# Patient Record
Sex: Male | Born: 1940 | Race: White | Hispanic: No | Marital: Married | Smoking: Never smoker
Health system: Southern US, Community
[De-identification: ages and names within clinical notes are randomized; demographics above are authoritative.]

## PROBLEM LIST (undated history)

## (undated) DIAGNOSIS — E785 Hyperlipidemia, unspecified: Secondary | ICD-10-CM

## (undated) DIAGNOSIS — K649 Unspecified hemorrhoids: Secondary | ICD-10-CM

## (undated) DIAGNOSIS — F329 Major depressive disorder, single episode, unspecified: Secondary | ICD-10-CM

## (undated) DIAGNOSIS — F32A Depression, unspecified: Secondary | ICD-10-CM

---
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
## 2020-01-20 ENCOUNTER — Encounter (HOSPITAL_COMMUNITY): Payer: Self-pay | Admitting: Emergency Medicine

## 2020-01-20 ENCOUNTER — Emergency Department (HOSPITAL_COMMUNITY): Payer: Medicare Other

## 2020-01-20 ENCOUNTER — Inpatient Hospital Stay (HOSPITAL_COMMUNITY)
Admission: EM | Admit: 2020-01-20 | Discharge: 2020-01-24 | DRG: 521 | Disposition: A | Discharge status: Rehabilitation Facility | Payer: Medicare Other | Attending: Internal Medicine | Admitting: Internal Medicine

## 2020-01-20 ENCOUNTER — Other Ambulatory Visit: Payer: Self-pay

## 2020-01-20 DIAGNOSIS — W050XXD Fall from non-moving wheelchair, subsequent encounter: Secondary | ICD-10-CM | POA: Diagnosis present

## 2020-01-20 DIAGNOSIS — M7989 Other specified soft tissue disorders: Secondary | ICD-10-CM | POA: Diagnosis not present

## 2020-01-20 DIAGNOSIS — Z96649 Presence of unspecified artificial hip joint: Secondary | ICD-10-CM

## 2020-01-20 DIAGNOSIS — F0390 Unspecified dementia without behavioral disturbance: Secondary | ICD-10-CM | POA: Diagnosis present

## 2020-01-20 DIAGNOSIS — Z79899 Other long term (current) drug therapy: Secondary | ICD-10-CM | POA: Diagnosis not present

## 2020-01-20 DIAGNOSIS — Z993 Dependence on wheelchair: Secondary | ICD-10-CM | POA: Diagnosis not present

## 2020-01-20 DIAGNOSIS — Z419 Encounter for procedure for purposes other than remedying health state, unspecified: Secondary | ICD-10-CM

## 2020-01-20 DIAGNOSIS — K5900 Constipation, unspecified: Secondary | ICD-10-CM | POA: Diagnosis present

## 2020-01-20 DIAGNOSIS — S72001A Fracture of unspecified part of neck of right femur, initial encounter for closed fracture: Principal | ICD-10-CM | POA: Diagnosis present

## 2020-01-20 DIAGNOSIS — S72001S Fracture of unspecified part of neck of right femur, sequela: Secondary | ICD-10-CM | POA: Diagnosis not present

## 2020-01-20 DIAGNOSIS — D62 Acute posthemorrhagic anemia: Secondary | ICD-10-CM | POA: Diagnosis present

## 2020-01-20 DIAGNOSIS — Z8659 Personal history of other mental and behavioral disorders: Secondary | ICD-10-CM | POA: Diagnosis not present

## 2020-01-20 DIAGNOSIS — E559 Vitamin D deficiency, unspecified: Secondary | ICD-10-CM | POA: Diagnosis present

## 2020-01-20 DIAGNOSIS — G9341 Metabolic encephalopathy: Secondary | ICD-10-CM | POA: Diagnosis not present

## 2020-01-20 DIAGNOSIS — Z7982 Long term (current) use of aspirin: Secondary | ICD-10-CM | POA: Diagnosis not present

## 2020-01-20 DIAGNOSIS — Y92009 Unspecified place in unspecified non-institutional (private) residence as the place of occurrence of the external cause: Secondary | ICD-10-CM | POA: Diagnosis not present

## 2020-01-20 DIAGNOSIS — N400 Enlarged prostate without lower urinary tract symptoms: Secondary | ICD-10-CM | POA: Diagnosis present

## 2020-01-20 DIAGNOSIS — F329 Major depressive disorder, single episode, unspecified: Secondary | ICD-10-CM | POA: Diagnosis present

## 2020-01-20 DIAGNOSIS — I69351 Hemiplegia and hemiparesis following cerebral infarction affecting right dominant side: Secondary | ICD-10-CM

## 2020-01-20 DIAGNOSIS — Z20822 Contact with and (suspected) exposure to covid-19: Secondary | ICD-10-CM | POA: Diagnosis present

## 2020-01-20 DIAGNOSIS — I69311 Memory deficit following cerebral infarction: Secondary | ICD-10-CM | POA: Diagnosis not present

## 2020-01-20 DIAGNOSIS — E785 Hyperlipidemia, unspecified: Secondary | ICD-10-CM | POA: Diagnosis present

## 2020-01-20 DIAGNOSIS — W19XXXA Unspecified fall, initial encounter: Secondary | ICD-10-CM

## 2020-01-20 DIAGNOSIS — G8111 Spastic hemiplegia affecting right dominant side: Secondary | ICD-10-CM | POA: Diagnosis not present

## 2020-01-20 DIAGNOSIS — R413 Other amnesia: Secondary | ICD-10-CM | POA: Diagnosis not present

## 2020-01-20 DIAGNOSIS — R252 Cramp and spasm: Secondary | ICD-10-CM | POA: Diagnosis present

## 2020-01-20 DIAGNOSIS — W050XXA Fall from non-moving wheelchair, initial encounter: Secondary | ICD-10-CM | POA: Diagnosis present

## 2020-01-20 DIAGNOSIS — R400 Somnolence: Secondary | ICD-10-CM | POA: Diagnosis not present

## 2020-01-20 DIAGNOSIS — S72091D Other fracture of head and neck of right femur, subsequent encounter for closed fracture with routine healing: Secondary | ICD-10-CM | POA: Diagnosis present

## 2020-01-20 DIAGNOSIS — I69322 Dysarthria following cerebral infarction: Secondary | ICD-10-CM | POA: Diagnosis not present

## 2020-01-20 DIAGNOSIS — R410 Disorientation, unspecified: Secondary | ICD-10-CM | POA: Diagnosis not present

## 2020-01-20 DIAGNOSIS — Z79891 Long term (current) use of opiate analgesic: Secondary | ICD-10-CM | POA: Diagnosis not present

## 2020-01-20 DIAGNOSIS — Z96641 Presence of right artificial hip joint: Secondary | ICD-10-CM | POA: Diagnosis present

## 2020-01-20 DIAGNOSIS — H04129 Dry eye syndrome of unspecified lacrimal gland: Secondary | ICD-10-CM | POA: Diagnosis not present

## 2020-01-20 DIAGNOSIS — G4700 Insomnia, unspecified: Secondary | ICD-10-CM | POA: Diagnosis not present

## 2020-01-20 DIAGNOSIS — T148XXA Other injury of unspecified body region, initial encounter: Secondary | ICD-10-CM

## 2020-01-20 DIAGNOSIS — S72001D Fracture of unspecified part of neck of right femur, subsequent encounter for closed fracture with routine healing: Secondary | ICD-10-CM | POA: Diagnosis not present

## 2020-01-20 DIAGNOSIS — I69398 Other sequelae of cerebral infarction: Secondary | ICD-10-CM | POA: Diagnosis not present

## 2020-01-20 HISTORY — DX: Depression, unspecified: F32.A

## 2020-01-20 HISTORY — DX: Unspecified hemorrhoids: K64.9

## 2020-01-20 HISTORY — DX: Hyperlipidemia, unspecified: E78.5

## 2020-01-20 LAB — CBC WITH DIFFERENTIAL/PLATELET
Abs Immature Granulocytes: 0.04 10*3/uL (ref 0.00–0.07)
Basophils Absolute: 0 10*3/uL (ref 0.0–0.1)
Basophils Relative: 0 %
Eosinophils Absolute: 0 10*3/uL (ref 0.0–0.5)
Eosinophils Relative: 0 %
HCT: 42.1 % (ref 39.0–52.0)
Hemoglobin: 13.7 g/dL (ref 13.0–17.0)
Immature Granulocytes: 0 %
Lymphocytes Relative: 6 %
Lymphs Abs: 0.6 10*3/uL — ABNORMAL LOW (ref 0.7–4.0)
MCH: 29.4 pg (ref 26.0–34.0)
MCHC: 32.5 g/dL (ref 30.0–36.0)
MCV: 90.3 fL (ref 80.0–100.0)
Monocytes Absolute: 0.6 10*3/uL (ref 0.1–1.0)
Monocytes Relative: 6 %
Neutro Abs: 9.3 10*3/uL — ABNORMAL HIGH (ref 1.7–7.7)
Neutrophils Relative %: 88 %
Platelets: 203 10*3/uL (ref 150–400)
RBC: 4.66 MIL/uL (ref 4.22–5.81)
RDW: 13 % (ref 11.5–15.5)
WBC: 10.6 10*3/uL — ABNORMAL HIGH (ref 4.0–10.5)
nRBC: 0 % (ref 0.0–0.2)

## 2020-01-20 LAB — BASIC METABOLIC PANEL
Anion gap: 12 (ref 5–15)
BUN: 24 mg/dL — ABNORMAL HIGH (ref 8–23)
CO2: 23 mmol/L (ref 22–32)
Calcium: 9.3 mg/dL (ref 8.9–10.3)
Chloride: 104 mmol/L (ref 98–111)
Creatinine, Ser: 0.96 mg/dL (ref 0.61–1.24)
GFR calc Af Amer: >60 mL/min (ref >60)
GFR calc non Af Amer: >60 mL/min (ref >60)
Glucose, Bld: 113 mg/dL — ABNORMAL HIGH (ref 70–99)
Potassium: 4.4 mmol/L (ref 3.5–5.1)
Sodium: 139 mmol/L (ref 135–145)

## 2020-01-20 MED ORDER — SENNA 8.6 MG PO TABS
1.0000 | ORAL_TABLET | Freq: Two times a day (BID) | ORAL | Status: DC
  Administered 2020-01-21 – 2020-01-24 (×5): 8.6 mg via ORAL
  Filled 2020-01-20 (×6): qty 1

## 2020-01-20 MED ORDER — METHOCARBAMOL 1000 MG/10ML IJ SOLN
500.0000 mg | Freq: Four times a day (QID) | INTRAVENOUS | Status: DC | PRN
  Filled 2020-01-20 (×3): qty 5

## 2020-01-20 MED ORDER — MORPHINE SULFATE (PF) 4 MG/ML IV SOLN
4.0000 mg | Freq: Once | INTRAVENOUS | Status: AC
  Administered 2020-01-20: 22:00:00 4 mg via INTRAVENOUS
  Filled 2020-01-20: qty 1

## 2020-01-20 MED ORDER — HYDROCODONE-ACETAMINOPHEN 5-325 MG PO TABS
1.0000 | ORAL_TABLET | Freq: Four times a day (QID) | ORAL | Status: DC | PRN
  Administered 2020-01-20: 1 via ORAL
  Administered 2020-01-21: 2 via ORAL
  Filled 2020-01-20: qty 1
  Filled 2020-01-20: qty 2

## 2020-01-20 MED ORDER — MORPHINE SULFATE (PF) 2 MG/ML IV SOLN
0.5000 mg | INTRAVENOUS | Status: DC | PRN
  Administered 2020-01-20 – 2020-01-21 (×2): 0.5 mg via INTRAVENOUS
  Filled 2020-01-20 (×2): qty 1

## 2020-01-20 MED ORDER — METHOCARBAMOL 500 MG PO TABS
500.0000 mg | ORAL_TABLET | Freq: Four times a day (QID) | ORAL | Status: DC | PRN
  Administered 2020-01-20 – 2020-01-22 (×3): 500 mg via ORAL
  Filled 2020-01-20 (×3): qty 1

## 2020-01-20 NOTE — ED Notes (Signed)
Pt returned from x-ray.  Pt's daughter updated on pt's condition at this time.  Pt remains alert and oriented x's 3

## 2020-01-20 NOTE — ED Triage Notes (Signed)
Pt to ED via [facility] after reported sliding out of his chair while exercising.  Pt st's he feels like he pulled something in his right thigh.  Pt also c/o soreness to right buttock

## 2020-01-20 NOTE — H&P (Signed)
History and Physical    Joshua Shannon WCB:[id number] DOB: 04/05/1941 DOA: 01/20/2020  PCP: Kaleen Mask, MD Patient coming from: ALF  Chief Complaint: Fall, right hip pain  HPI: Joshua Shannon is a 79 y.o. male with medical history significant of hyperlipidemia, depression, hemorrhoids presenting to the ED for evaluation of right hip pain after a mechanical fall.  Patient states he fell out of his wheelchair and since then is having pain in his right hip region.  Denies head injury or loss of consciousness.  Denies lightheadedness/dizziness, chest pain, palpitations, or shortness of breath.  No other complaints.   ED Course: Afebrile and hemodynamically stable.  X-ray showing acute right femoral neck fracture.  Labs showing no significant leukocytosis.  Chest x-ray not suggestive of pneumonia.  ED provider discussed the case with Dr. Everardo Pacific, Ortho will consult in a.m. Patient received morphine.  Review of Systems:  All systems reviewed and apart from history of presenting illness, are negative.  Past Medical History:  Diagnosis Date  . Depression   . Hemorrhoids   . Hyperlipemia     History reviewed. No pertinent surgical history.   reports that he has never smoked. He has never used smokeless tobacco. He reports that he does not use drugs. No history on file for alcohol.  Not on File  History reviewed. No pertinent family history.  Prior to Admission medications   Not on File    Physical Exam: Vitals:   01/20/20 1822 01/20/20 2036  BP: (!) 158/79   Pulse: 91   Resp: 16   Temp: 98.2 F (36.8 C)   TempSrc: Oral   SpO2: 94%   Weight:  58.5 kg  Height:  5\' 7"  (1.702 m)    Physical Exam  Constitutional: He is oriented to person, place, and time. He appears well-developed and well-nourished. No distress.  HENT:  Head: Normocephalic.  Eyes: Right eye exhibits no discharge. Left eye exhibits no discharge.  Cardiovascular: Normal rate, regular rhythm and intact  distal pulses.  Pulmonary/Chest: Effort normal and breath sounds normal. No respiratory distress. He has no wheezes. He has no rales.  Abdominal: Soft. Bowel sounds are normal. He exhibits no distension. There is no abdominal tenderness. There is no guarding.  Musculoskeletal:        General: No edema.     Cervical back: Neck supple.     Comments: Right lower extremity shortened and externally rotated  Neurological: He is alert and oriented to person, place, and time.  Skin: Skin is warm and dry. He is not diaphoretic.     Labs on Admission: I have personally reviewed following labs and imaging studies  CBC: Recent Labs  Lab 01/20/20 2136  WBC 10.6*  NEUTROABS 9.3*  HGB 13.7  HCT 42.1  MCV 90.3  PLT 203   Basic Metabolic Panel: Recent Labs  Lab 01/20/20 2136  NA 139  K 4.4  CL 104  CO2 23  GLUCOSE 113*  BUN 24*  CREATININE 0.96  CALCIUM 9.3   GFR: Estimated Creatinine Clearance: 54.2 mL/min (by C-G formula based on SCr of 0.96 mg/dL). Liver Function Tests: No results for input(s): AST, ALT, ALKPHOS, BILITOT, PROT, ALBUMIN in the last 168 hours. No results for input(s): LIPASE, AMYLASE in the last 168 hours. No results for input(s): AMMONIA in the last 168 hours. Coagulation Profile: No results for input(s): INR, PROTIME in the last 168 hours. Cardiac Enzymes: No results for input(s): CKTOTAL, CKMB, CKMBINDEX, TROPONINI in the last  168 hours. BNP (last 3 results) No results for input(s): PROBNP in the last 8760 hours. HbA1C: No results for input(s): HGBA1C in the last 72 hours. CBG: No results for input(s): GLUCAP in the last 168 hours. Lipid Profile: No results for input(s): CHOL, HDL, LDLCALC, TRIG, CHOLHDL, LDLDIRECT in the last 72 hours. Thyroid Function Tests: No results for input(s): TSH, T4TOTAL, FREET4, T3FREE, THYROIDAB in the last 72 hours. Anemia Panel: No results for input(s): VITAMINB12, FOLATE, FERRITIN, TIBC, IRON, RETICCTPCT in the last 72  hours. Urine analysis: No results found for: COLORURINE, APPEARANCEUR, LABSPEC, PHURINE, GLUCOSEU, HGBUR, BILIRUBINUR, KETONESUR, PROTEINUR, UROBILINOGEN, NITRITE, LEUKOCYTESUR  Radiological Exams on Admission: DG Chest 1 View  Result Date: 01/20/2020 CLINICAL DATA:  Right hip fracture EXAM: CHEST  1 VIEW COMPARISON:  None. FINDINGS: The heart size and mediastinal contours are within normal limits. Aortic atherosclerosis. Both lungs are clear. The visualized skeletal structures are unremarkable. IMPRESSION: No active disease. Electronically Signed   By: Donavan Foil M.D.   On: 01/20/2020 20:26   DG Pelvis 1-2 Views  Result Date: 01/20/2020 CLINICAL DATA:  [place] onto right hip EXAM: PELVIS - 1-2 VIEW COMPARISON:  None. FINDINGS: Metallic fragment or hardware over the upper sacrum. Pubic symphysis and rami appear intact. No fracture or malalignment left hip. Acute mildly displaced right femoral neck fracture. IMPRESSION: Acute right femoral neck fracture Electronically Signed   By: Donavan Foil M.D.   On: 01/20/2020 20:25   DG Femur Min 2 Views Right  Result Date: 01/20/2020 CLINICAL DATA:  Fall onto right hip EXAM: RIGHT FEMUR 2 VIEWS COMPARISON:  None. FINDINGS: Acute right femoral neck fracture. No femoral head dislocation. Mid to distal femur show no fracture or malalignment. Vascular calcifications. IMPRESSION: Acute right femoral neck fracture Electronically Signed   By: Donavan Foil M.D.   On: 01/20/2020 20:26    EKG: Ordered and pending at this time.  Assessment/Plan Principal Problem:   Hip fracture (HCC) Active Problems:   Fall at home, initial encounter   Right femoral neck fracture secondary to a mechanical fall -Ortho will consult in a.m. -Keep n.p.o. after midnight -Morphine as needed for pain, Norco as needed -Robaxin as needed for muscle spasms -Nonweightbearing -Bowel regimen  Pharmacy med rec pending.  DVT prophylaxis: SCDs at this time, pending orthopedic  evaluation Code Status: Patient wishes to be DNR. Family Communication: No family available at this time. Disposition Plan: Anticipate discharge after clinical improvement. Consults called: Orthopedics Admission status: It is my clinical opinion that admission to INPATIENT is reasonable and necessary in this 79 y.o. male . presenting with right femoral neck fracture secondary to mechanical fall.  Will need surgery, Ortho will consult in a.m.  Given the aforementioned, the predictability of an adverse outcome is felt to be significant. I expect that the patient will require at least 2 midnights in the hospital to treat this condition.  Shela Leff MD [facility]  If 7PM-7AM, please contact night-coverage [email or web address] Password [facility]  01/20/2020, 10:41 PM

## 2020-01-20 NOTE — ED Provider Notes (Signed)
[facility] EMERGENCY DEPARTMENT Provider Note   CSN: [id number] Arrival date & time: 01/20/20  1809     History Chief Complaint  Patient presents with  . Fall    Joshua Shannon is a 79 y.o. male.  79 year old male with past medical history below who presents with fall.  Patient reports just prior to arrival, he had a mechanical fall out of his wheelchair and he landed on his right side/hip.  Since the fall, he has had moderate, constant pain in his right hip and thigh that radiates down to his knee.  He denies any head injury, loss of consciousness, or any other areas of pain.  He takes aspirin but no other anticoagulants.  The history is provided by the patient.  Fall       Past Medical History:  Diagnosis Date  . Depression   . Hemorrhoids   . Hyperlipemia     Patient Active Problem List   Diagnosis Date Noted  . Hip fracture ([place]) 01/20/2020    History reviewed. No pertinent surgical history.     History reviewed. No pertinent family history.  Social History   Tobacco Use  . Smoking status: Never Smoker  . Smokeless tobacco: Never Used  Substance Use Topics  . Alcohol use: Not on file  . Drug use: Never    Home Medications Prior to Admission medications   Not on File    Allergies    Patient has no allergy information on record.  Review of Systems   Review of Systems All other systems reviewed and are negative except that which was mentioned in HPI  Physical Exam Updated Vital Signs BP (!) 158/79 (BP Location: Left Arm)   Pulse 91   Temp 98.2 F (36.8 C) (Oral)   Resp 16   SpO2 94%   Physical Exam Vitals and nursing note reviewed.  Constitutional:      General: He is not in acute distress.    Appearance: He is well-developed.  HENT:     Head: Normocephalic and atraumatic.  Eyes:     Conjunctiva/sclera: Conjunctivae normal.  Cardiovascular:     Rate and Rhythm: Normal rate and regular rhythm.     Pulses: Normal  pulses.     Heart sounds: Normal heart sounds.  Pulmonary:     Effort: Pulmonary effort is normal.     Breath sounds: Normal breath sounds.  Chest:     Chest wall: No tenderness.  Abdominal:     General: Bowel sounds are normal. There is no distension.     Palpations: Abdomen is soft.     Tenderness: There is no abdominal tenderness.  Musculoskeletal:        General: Tenderness present.     Cervical back: Normal range of motion and neck supple. No tenderness.     Comments: Tenderness to palpation of right hip with leg slightly externally rotated, partially flexed at knee, no obvious femur deformity; no other focal joint tenderness  Skin:    General: Skin is warm and dry.  Neurological:     Mental Status: He is alert and oriented to person, place, and time.     Sensory: No sensory deficit.     Comments: Fluent speech  Psychiatric:        Judgment: Judgment normal.     ED Results / Procedures / Treatments   Labs (all labs ordered are listed, but only abnormal results are displayed) Labs Reviewed  BASIC METABOLIC PANEL -  Abnormal; Notable for the following components:      Result Value   Glucose, Bld 113 (*)    BUN 24 (*)    All other components within normal limits  CBC WITH DIFFERENTIAL/PLATELET - Abnormal; Notable for the following components:   WBC 10.6 (*)    Neutro Abs 9.3 (*)    Lymphs Abs 0.6 (*)    All other components within normal limits  SARS CORONAVIRUS 2 (TAT 6-24 HRS)    EKG None  Radiology DG Chest 1 View  Result Date: 01/20/2020 CLINICAL DATA:  Right hip fracture EXAM: CHEST  1 VIEW COMPARISON:  None. FINDINGS: The heart size and mediastinal contours are within normal limits. Aortic atherosclerosis. Both lungs are clear. The visualized skeletal structures are unremarkable. IMPRESSION: No active disease. Electronically Signed   By: Jasmine Pang M.D.   On: 01/20/2020 20:26   DG Pelvis 1-2 Views  Result Date: 01/20/2020 CLINICAL DATA:  [place] onto right  hip EXAM: PELVIS - 1-2 VIEW COMPARISON:  None. FINDINGS: Metallic fragment or hardware over the upper sacrum. Pubic symphysis and rami appear intact. No fracture or malalignment left hip. Acute mildly displaced right femoral neck fracture. IMPRESSION: Acute right femoral neck fracture Electronically Signed   By: Jasmine Pang M.D.   On: 01/20/2020 20:25   DG Femur Min 2 Views Right  Result Date: 01/20/2020 CLINICAL DATA:  Fall onto right hip EXAM: RIGHT FEMUR 2 VIEWS COMPARISON:  None. FINDINGS: Acute right femoral neck fracture. No femoral head dislocation. Mid to distal femur show no fracture or malalignment. Vascular calcifications. IMPRESSION: Acute right femoral neck fracture Electronically Signed   By: Jasmine Pang M.D.   On: 01/20/2020 20:26    Procedures Procedures (including critical care time)  Medications Ordered in ED Medications - No data to display  ED Course  I have reviewed the triage vital signs and the nursing notes.  Pertinent labs & imaging results that were available during my care of the patient were reviewed by me and considered in my medical decision making (see chart for details).    MDM Rules/Calculators/A&P                      Neurovascularly intact distally.  Plain films show femoral neck fracture on the right.  Discussed with patient and then contacted his wife over the phone.  She states that he is wheelchair-bound at baseline and only ambulates during physical therapy, otherwise does not ambulate.  Contacted orthopedics, discussed w/ Dr. Everardo Pacific. They will see pt in consultation tomorrow to discuss options.  Ultimately, I feel that the patient would benefit from pain control in the hospital even if he is deemed not a surgical candidate.  Discussed admission with [facility], Dr. Loney Loh.  Final Clinical Impression(s) / ED Diagnoses Final diagnoses:  None    Rx / DC Orders ED Discharge Orders    None       Pristine Gladhill, Ambrose Finland, MD 01/20/20 2238

## 2020-01-20 NOTE — ED Notes (Signed)
Joshua Shannon daughter [phone] looking for an update on the patient

## 2020-01-21 ENCOUNTER — Inpatient Hospital Stay (HOSPITAL_COMMUNITY): Payer: Medicare Other

## 2020-01-21 ENCOUNTER — Inpatient Hospital Stay (HOSPITAL_COMMUNITY): Payer: Medicare Other | Admitting: Certified Registered Nurse Anesthetist

## 2020-01-21 ENCOUNTER — Encounter (HOSPITAL_COMMUNITY)
Admission: EM | Disposition: A | Discharge status: Rehabilitation Facility | Payer: Self-pay | Source: Home / Self Care | Attending: Family Medicine

## 2020-01-21 ENCOUNTER — Other Ambulatory Visit: Payer: Self-pay

## 2020-01-21 ENCOUNTER — Encounter (HOSPITAL_COMMUNITY): Payer: Self-pay | Admitting: Internal Medicine

## 2020-01-21 DIAGNOSIS — S72001A Fracture of unspecified part of neck of right femur, initial encounter for closed fracture: Principal | ICD-10-CM

## 2020-01-21 HISTORY — PX: ANTERIOR APPROACH HEMI HIP ARTHROPLASTY: SHX6690

## 2020-01-21 LAB — CBC
HCT: 39.7 % (ref 39.0–52.0)
Hemoglobin: 13 g/dL (ref 13.0–17.0)
MCH: 29.4 pg (ref 26.0–34.0)
MCHC: 32.7 g/dL (ref 30.0–36.0)
MCV: 89.8 fL (ref 80.0–100.0)
Platelets: 181 10*3/uL (ref 150–400)
RBC: 4.42 MIL/uL (ref 4.22–5.81)
RDW: 12.6 % (ref 11.5–15.5)
WBC: 9.3 10*3/uL (ref 4.0–10.5)
nRBC: 0 % (ref 0.0–0.2)

## 2020-01-21 LAB — CREATININE, SERUM
Creatinine, Ser: 0.97 mg/dL (ref 0.61–1.24)
GFR calc Af Amer: >60 mL/min (ref >60)
GFR calc non Af Amer: >60 mL/min (ref >60)

## 2020-01-21 LAB — SURGICAL PCR SCREEN
MRSA, PCR: NEGATIVE
Staphylococcus aureus: NEGATIVE

## 2020-01-21 LAB — SARS CORONAVIRUS 2 (TAT 6-24 HRS): SARS Coronavirus 2: NEGATIVE

## 2020-01-21 SURGERY — HEMIARTHROPLASTY, HIP, DIRECT ANTERIOR APPROACH, FOR FRACTURE
Anesthesia: Choice | Laterality: Right

## 2020-01-21 SURGERY — HEMIARTHROPLASTY, HIP, DIRECT ANTERIOR APPROACH, FOR FRACTURE
Anesthesia: Spinal | Site: Hip | Laterality: Right

## 2020-01-21 MED ORDER — SUCCINYLCHOLINE CHLORIDE 200 MG/10ML IV SOSY
PREFILLED_SYRINGE | INTRAVENOUS | Status: AC
  Filled 2020-01-21: qty 10

## 2020-01-21 MED ORDER — STERILE WATER FOR IRRIGATION IR SOLN
Status: DC | PRN
  Administered 2020-01-21: 1000 mL

## 2020-01-21 MED ORDER — TRANEXAMIC ACID 1000 MG/10ML IV SOLN
2000.0000 mg | INTRAVENOUS | Status: DC
  Filled 2020-01-21: qty 20

## 2020-01-21 MED ORDER — BUPIVACAINE LIPOSOME 1.3 % IJ SUSP
20.0000 mL | INTRAMUSCULAR | Status: AC
  Administered 2020-01-21: 20 mL
  Filled 2020-01-21: qty 20

## 2020-01-21 MED ORDER — VANCOMYCIN HCL 1000 MG IV SOLR
INTRAVENOUS | Status: AC
  Filled 2020-01-21: qty 1000

## 2020-01-21 MED ORDER — HYDROCODONE-ACETAMINOPHEN 7.5-325 MG PO TABS
1.0000 | ORAL_TABLET | ORAL | Status: DC | PRN
  Administered 2020-01-21: 22:00:00 1 via ORAL
  Administered 2020-01-22: 2 via ORAL
  Filled 2020-01-21: qty 1
  Filled 2020-01-21: qty 2

## 2020-01-21 MED ORDER — CHLORHEXIDINE GLUCONATE CLOTH 2 % EX PADS
6.0000 | MEDICATED_PAD | Freq: Every day | CUTANEOUS | Status: DC
  Administered 2020-01-21: 6 via TOPICAL

## 2020-01-21 MED ORDER — METHOCARBAMOL 1000 MG/10ML IJ SOLN: 500.0000 mg | Freq: Four times a day (QID) | INTRAVENOUS | Status: DC | PRN

## 2020-01-21 MED ORDER — SODIUM CHLORIDE 0.9 % IV SOLN: INTRAVENOUS | Status: DC

## 2020-01-21 MED ORDER — LACTATED RINGERS IV SOLN: INTRAVENOUS | Status: DC

## 2020-01-21 MED ORDER — BUPIVACAINE IN DEXTROSE 0.75-8.25 % IT SOLN
INTRATHECAL | Status: DC | PRN
  Administered 2020-01-21: 1.8 mL via INTRATHECAL

## 2020-01-21 MED ORDER — FENTANYL CITRATE (PF) 250 MCG/5ML IJ SOLN
INTRAMUSCULAR | Status: AC
  Filled 2020-01-21: qty 5

## 2020-01-21 MED ORDER — ACETAMINOPHEN 500 MG PO TABS
500.0000 mg | ORAL_TABLET | Freq: Four times a day (QID) | ORAL | Status: AC
  Administered 2020-01-21 – 2020-01-22 (×4): 500 mg via ORAL
  Filled 2020-01-21 (×5): qty 1

## 2020-01-21 MED ORDER — BUPIVACAINE HCL (PF) 0.25 % IJ SOLN
INTRAMUSCULAR | Status: AC
  Filled 2020-01-21: qty 30

## 2020-01-21 MED ORDER — SODIUM CHLORIDE (PF) 0.9 % IJ SOLN
INTRAMUSCULAR | Status: DC | PRN
  Administered 2020-01-21 (×2): 10 mL

## 2020-01-21 MED ORDER — BUPIVACAINE HCL (PF) 0.25 % IJ SOLN
INTRAMUSCULAR | Status: DC | PRN
  Administered 2020-01-21: 20 mL

## 2020-01-21 MED ORDER — PROPOFOL 10 MG/ML IV BOLUS
INTRAVENOUS | Status: AC
  Filled 2020-01-21: qty 20

## 2020-01-21 MED ORDER — PROPOFOL 10 MG/ML IV BOLUS
INTRAVENOUS | Status: DC | PRN
  Administered 2020-01-21: 10 mg via INTRAVENOUS
  Administered 2020-01-21 (×2): 20 mg via INTRAVENOUS
  Administered 2020-01-21: 30 mg via INTRAVENOUS

## 2020-01-21 MED ORDER — POLYETHYLENE GLYCOL 3350 17 G PO PACK
17.0000 g | PACK | Freq: Every day | ORAL | Status: DC | PRN
  Administered 2020-01-22: 17 g via ORAL
  Filled 2020-01-21: qty 1

## 2020-01-21 MED ORDER — ONDANSETRON HCL 4 MG/2ML IJ SOLN: 4.0000 mg | Freq: Once | INTRAMUSCULAR | Status: DC | PRN

## 2020-01-21 MED ORDER — MUPIROCIN 2 % EX OINT
1.0000 "application " | TOPICAL_OINTMENT | Freq: Two times a day (BID) | CUTANEOUS | Status: DC
  Administered 2020-01-21 – 2020-01-24 (×6): 1 via NASAL
  Filled 2020-01-21: qty 22

## 2020-01-21 MED ORDER — HYDROCODONE-ACETAMINOPHEN 5-325 MG PO TABS
1.0000 | ORAL_TABLET | ORAL | Status: DC | PRN
  Administered 2020-01-24: 1 via ORAL
  Filled 2020-01-21: qty 1

## 2020-01-21 MED ORDER — ACETAMINOPHEN 325 MG PO TABS
325.0000 mg | ORAL_TABLET | Freq: Four times a day (QID) | ORAL | Status: DC | PRN
  Administered 2020-01-22 – 2020-01-23 (×3): 650 mg via ORAL
  Filled 2020-01-21 (×4): qty 2

## 2020-01-21 MED ORDER — ACETAMINOPHEN 10 MG/ML IV SOLN: 1000.0000 mg | Freq: Once | INTRAVENOUS | Status: DC | PRN

## 2020-01-21 MED ORDER — TRANEXAMIC ACID-NACL 1000-0.7 MG/100ML-% IV SOLN
1000.0000 mg | INTRAVENOUS | Status: AC
  Administered 2020-01-21: 1000 mg via INTRAVENOUS
  Filled 2020-01-21: qty 100

## 2020-01-21 MED ORDER — 0.9 % SODIUM CHLORIDE (POUR BTL) OPTIME
TOPICAL | Status: DC | PRN
  Administered 2020-01-21: 1000 mL

## 2020-01-21 MED ORDER — CEFAZOLIN SODIUM-DEXTROSE 2-4 GM/100ML-% IV SOLN
2.0000 g | INTRAVENOUS | Status: AC
  Administered 2020-01-21: 2 g via INTRAVENOUS
  Filled 2020-01-21: qty 100

## 2020-01-21 MED ORDER — MORPHINE SULFATE (PF) 2 MG/ML IV SOLN
1.0000 mg | INTRAVENOUS | Status: DC | PRN
  Administered 2020-01-22: 02:00:00 1 mg via INTRAVENOUS
  Filled 2020-01-21 (×2): qty 1

## 2020-01-21 MED ORDER — CEFAZOLIN SODIUM-DEXTROSE 2-4 GM/100ML-% IV SOLN
2.0000 g | Freq: Four times a day (QID) | INTRAVENOUS | Status: AC
  Administered 2020-01-21 – 2020-01-22 (×3): 2 g via INTRAVENOUS
  Filled 2020-01-21 (×3): qty 100

## 2020-01-21 MED ORDER — VANCOMYCIN HCL 1 G IV SOLR
INTRAVENOUS | Status: DC | PRN
  Administered 2020-01-21: 1000 mg

## 2020-01-21 MED ORDER — PROPOFOL 500 MG/50ML IV EMUL
INTRAVENOUS | Status: DC | PRN
  Administered 2020-01-21: 50 ug/kg/min via INTRAVENOUS

## 2020-01-21 MED ORDER — ONDANSETRON HCL 4 MG/2ML IJ SOLN
4.0000 mg | Freq: Four times a day (QID) | INTRAMUSCULAR | Status: DC | PRN
  Administered 2020-01-23 (×2): 4 mg via INTRAVENOUS
  Filled 2020-01-21 (×2): qty 2

## 2020-01-21 MED ORDER — TRANEXAMIC ACID-NACL 1000-0.7 MG/100ML-% IV SOLN
INTRAVENOUS | Status: AC
  Filled 2020-01-21: qty 100

## 2020-01-21 MED ORDER — ALUM & MAG HYDROXIDE-SIMETH 200-200-20 MG/5ML PO SUSP: 30.0000 mL | ORAL | Status: DC | PRN

## 2020-01-21 MED ORDER — POVIDONE-IODINE 10 % EX SWAB: 2.0000 "application " | Freq: Once | CUTANEOUS | Status: DC

## 2020-01-21 MED ORDER — FENTANYL CITRATE (PF) 100 MCG/2ML IJ SOLN: 25.0000 ug | INTRAMUSCULAR | Status: DC | PRN

## 2020-01-21 MED ORDER — PHENOL 1.4 % MT LIQD: 1.0000 | OROMUCOSAL | Status: DC | PRN

## 2020-01-21 MED ORDER — ONDANSETRON HCL 4 MG PO TABS: 4.0000 mg | ORAL_TABLET | Freq: Four times a day (QID) | ORAL | Status: DC | PRN

## 2020-01-21 MED ORDER — MAGNESIUM CITRATE PO SOLN: 1.0000 | Freq: Once | ORAL | Status: DC | PRN

## 2020-01-21 MED ORDER — ENOXAPARIN SODIUM 40 MG/0.4ML ~~LOC~~ SOLN: 40.0000 mg | Freq: Every day | SUBCUTANEOUS | 13 refills | Status: DC

## 2020-01-21 MED ORDER — SORBITOL 70 % SOLN: 30.0000 mL | Freq: Every day | Status: DC | PRN

## 2020-01-21 MED ORDER — DOCUSATE SODIUM 100 MG PO CAPS
100.0000 mg | ORAL_CAPSULE | Freq: Two times a day (BID) | ORAL | Status: DC
  Administered 2020-01-21 – 2020-01-24 (×5): 100 mg via ORAL
  Filled 2020-01-21 (×6): qty 1

## 2020-01-21 MED ORDER — LIDOCAINE 2% (20 MG/ML) 5 ML SYRINGE
INTRAMUSCULAR | Status: AC
  Filled 2020-01-21: qty 5

## 2020-01-21 MED ORDER — TRANEXAMIC ACID 1000 MG/10ML IV SOLN
2000.0000 mg | INTRAVENOUS | Status: AC
  Administered 2020-01-21: 2000 mg via TOPICAL
  Filled 2020-01-21: qty 20

## 2020-01-21 MED ORDER — HYDROCODONE-ACETAMINOPHEN 5-325 MG PO TABS: 1.0000 | ORAL_TABLET | Freq: Three times a day (TID) | ORAL | 0 refills | Status: DC | PRN

## 2020-01-21 MED ORDER — SODIUM CHLORIDE 0.9 % IR SOLN
Status: DC | PRN
  Administered 2020-01-21: 3000 mL

## 2020-01-21 MED ORDER — MENTHOL 3 MG MT LOZG
1.0000 | LOZENGE | OROMUCOSAL | Status: DC | PRN
  Administered 2020-01-23: 3 mg via ORAL
  Filled 2020-01-21: qty 9

## 2020-01-21 MED ORDER — PHENYLEPHRINE HCL-NACL 10-0.9 MG/250ML-% IV SOLN
INTRAVENOUS | Status: DC | PRN
  Administered 2020-01-21: 25 ug/min via INTRAVENOUS

## 2020-01-21 MED ORDER — METHOCARBAMOL 500 MG PO TABS: 500.0000 mg | ORAL_TABLET | Freq: Four times a day (QID) | ORAL | Status: DC | PRN

## 2020-01-21 MED ORDER — FENTANYL CITRATE (PF) 250 MCG/5ML IJ SOLN
INTRAMUSCULAR | Status: DC | PRN
  Administered 2020-01-21: 50 ug via INTRAVENOUS
  Administered 2020-01-21: 25 ug via INTRAVENOUS

## 2020-01-21 MED ORDER — ENOXAPARIN SODIUM 40 MG/0.4ML ~~LOC~~ SOLN
40.0000 mg | SUBCUTANEOUS | Status: DC
  Administered 2020-01-22 – 2020-01-24 (×3): 40 mg via SUBCUTANEOUS
  Filled 2020-01-21 (×3): qty 0.4

## 2020-01-21 SURGICAL SUPPLY — 61 items
BAG DECANTER FOR FLEXI CONT (MISCELLANEOUS) ×3 IMPLANT
BALL HIP ARTICU 28 +5 (Hips) ×1 IMPLANT
BIPOLAR DEPUY 50 (Hips) ×2 IMPLANT
BIPOLAR DEPUY 50MM (Hips) ×1 IMPLANT
CELLS DAT CNTRL 66122 CELL SVR (MISCELLANEOUS) IMPLANT
COVER PERINEAL POST (MISCELLANEOUS) ×3 IMPLANT
COVER SURGICAL LIGHT HANDLE (MISCELLANEOUS) ×3 IMPLANT
COVER WAND RF STERILE (DRAPES) ×3 IMPLANT
DERMABOND ADVANCED (GAUZE/BANDAGES/DRESSINGS) ×2
DERMABOND ADVANCED .7 DNX12 (GAUZE/BANDAGES/DRESSINGS) ×1 IMPLANT
DRAPE C-ARM 42X72 X-RAY (DRAPES) ×3 IMPLANT
DRAPE POUCH INSTRU U-SHP 10X18 (DRAPES) ×3 IMPLANT
DRAPE STERI IOBAN 125X83 (DRAPES) ×3 IMPLANT
DRAPE U-SHAPE 47X51 STRL (DRAPES) ×6 IMPLANT
DRSG AQUACEL AG ADV 3.5X10 (GAUZE/BANDAGES/DRESSINGS) ×3 IMPLANT
DURAPREP 26ML APPLICATOR (WOUND CARE) ×6 IMPLANT
ELECT BLADE 4.0 EZ CLEAN MEGAD (MISCELLANEOUS) ×3
ELECT REM PT RETURN 9FT ADLT (ELECTROSURGICAL) ×3
ELECTRODE BLDE 4.0 EZ CLN MEGD (MISCELLANEOUS) ×1 IMPLANT
ELECTRODE REM PT RTRN 9FT ADLT (ELECTROSURGICAL) ×1 IMPLANT
GLOVE BIOGEL PI IND STRL 7.0 (GLOVE) ×1 IMPLANT
GLOVE BIOGEL PI INDICATOR 7.0 (GLOVE) ×2
GLOVE ECLIPSE 7.0 STRL STRAW (GLOVE) ×6 IMPLANT
GLOVE SKINSENSE NS SZ7.5 (GLOVE) ×2
GLOVE SKINSENSE STRL SZ7.5 (GLOVE) ×1 IMPLANT
GLOVE SURG SYN 7.5  E (GLOVE) ×10
GLOVE SURG SYN 7.5 E (GLOVE) ×5 IMPLANT
GOWN STRL REIN XL XLG (GOWN DISPOSABLE) ×3 IMPLANT
GOWN STRL REUS W/ TWL LRG LVL3 (GOWN DISPOSABLE) IMPLANT
GOWN STRL REUS W/ TWL XL LVL3 (GOWN DISPOSABLE) ×1 IMPLANT
GOWN STRL REUS W/TWL LRG LVL3 (GOWN DISPOSABLE)
GOWN STRL REUS W/TWL XL LVL3 (GOWN DISPOSABLE) ×2
HANDPIECE INTERPULSE COAX TIP (DISPOSABLE) ×2
HEAD BIPOLAR DEPUY 50 (Hips) ×1 IMPLANT
HIP BALL ARTICU 28 +5 (Hips) ×3 IMPLANT
HOOD PEEL AWAY FLYTE STAYCOOL (MISCELLANEOUS) ×6 IMPLANT
IV NS IRRIG 3000ML ARTHROMATIC (IV SOLUTION) ×3 IMPLANT
KIT BASIN OR (CUSTOM PROCEDURE TRAY) ×3 IMPLANT
MARKER SKIN DUAL TIP RULER LAB (MISCELLANEOUS) ×3 IMPLANT
NEEDLE SPNL 18GX3.5 QUINCKE PK (NEEDLE) ×6 IMPLANT
PACK TOTAL JOINT (CUSTOM PROCEDURE TRAY) ×3 IMPLANT
PACK UNIVERSAL I (CUSTOM PROCEDURE TRAY) ×3 IMPLANT
RTRCTR WOUND ALEXIS 18CM MED (MISCELLANEOUS)
SAW OSC TIP CART 19.5X105X1.3 (SAW) ×3 IMPLANT
SET HNDPC FAN SPRY TIP SCT (DISPOSABLE) ×1 IMPLANT
STAPLER VISISTAT 35W (STAPLE) IMPLANT
STEM FEMORAL SZ 6MM STD ACTIS (Stem) ×3 IMPLANT
SUT ETHIBOND 2 V 37 (SUTURE) ×3 IMPLANT
SUT ETHILON 2 0 FS 18 (SUTURE) ×6 IMPLANT
SUT VIC AB 0 CT1 27 (SUTURE) ×2
SUT VIC AB 0 CT1 27XBRD ANBCTR (SUTURE) ×1 IMPLANT
SUT VIC AB 1 CTX 36 (SUTURE) ×2
SUT VIC AB 1 CTX36XBRD ANBCTR (SUTURE) ×1 IMPLANT
SUT VIC AB 2-0 CT1 27 (SUTURE) ×4
SUT VIC AB 2-0 CT1 TAPERPNT 27 (SUTURE) ×2 IMPLANT
SYR 50ML LL SCALE MARK (SYRINGE) ×6 IMPLANT
TOWEL GREEN STERILE (TOWEL DISPOSABLE) ×3 IMPLANT
TRAY CATH 16FR W/PLASTIC CATH (SET/KITS/TRAYS/PACK) IMPLANT
TRAY FOLEY W/BAG SLVR 16FR (SET/KITS/TRAYS/PACK) ×2
TRAY FOLEY W/BAG SLVR 16FR ST (SET/KITS/TRAYS/PACK) ×1 IMPLANT
YANKAUER SUCT BULB TIP NO VENT (SUCTIONS) ×3 IMPLANT

## 2020-01-21 NOTE — TOC Initial Note (Signed)
Transition of Care [facility]) - Initial/Assessment Note    Patient Details  Name: Joshua Shannon MRN: [id number] Date of Birth: 1941/08/26  [facility] CM/SW Contact:    Alexander Mt, [place] Phone Number: 01/21/2020, 5:16 PM  Clinical Narrative:                 CSW met with pt this morning at bedside prior to surgery. Introduced self, role, reason for visit. Pt in discomfort but amenable to speaking with CSW. He is from [place] where he recently moved after selling his home. He has a daughter Mardene Celeste who lives in [place]. He states that he is looking forward to surgery to relieve some of his pain. CSW discussed that often [facility] residents after surgery spend time at [facility] SN[facility] but that we would need to see how he did post surgery.  TOC team will follow; acknowledge that pt daughter per Dr. Phoebe Sharps notes is interested in [place]. Will need order to evaluate appropriateness of this plan.   Expected Discharge Plan: Skilled Nursing Facility Barriers to Discharge: Continued Medical Work up, Ship broker   Patient Goals and CMS Choice Patient states their goals for this hospitalization and ongoing recovery are:: to get surgery and feel better CMS Medicare.gov Compare Post Acute Care list provided to:: (n/a at this time) Choice offered to / list presented to : Patient  Expected Discharge Plan and Services Expected Discharge Plan: [place] In-house Referral: Clinical Social Work Discharge Planning Services: CM Consult Post Acute Care Choice: Museum/gallery conservator, Skilled Nursing Facility, IP Rehab, Resumption of Svcs/PTA Provider Living arrangements for the past 2 months: [place]  Prior Living Arrangements/Services Living arrangements for the past 2 months: [place] Lives with:: Facility Resident Patient language and need for interpreter reviewed:: Yes(no  needs) Do you feel safe going back to the place where you live?: Yes      Need for Family Participation in Patient Care: Yes (Comment)(assistance with ADL/IADLs) Care giver support system in place?: Yes (comment)(pt is facility resident) Current home services: DME Criminal Activity/Legal Involvement Pertinent to Current Situation/Hospitalization: No - Comment as needed  Activities of Daily Living Home Assistive Devices/Equipment: Cane (specify quad or straight), Wheelchair ADL Screening (condition at time of admission) Patient's cognitive ability adequate to safely complete daily activities?: Yes Is the patient deaf or have difficulty hearing?: No Does the patient have difficulty seeing, even when wearing glasses/contacts?: Yes Does the patient have difficulty concentrating, remembering, or making decisions?: No Patient able to express need for assistance with ADLs?: Yes Does the patient have difficulty dressing or bathing?: No Independently performs ADLs?: Yes (appropriate for developmental age) Does the patient have difficulty walking or climbing stairs?: Yes Weakness of Legs: Right Weakness of Arms/Hands: Right  Permission Sought/Granted Permission sought to share information with : Family Supports Permission granted to share information with : Yes, Verbal Permission Granted  Share Information with NAME: Kaeson Kleinert  Permission granted to share info w AGENCY: [place] granted to share info w Relationship: daughter  Permission granted to share info w Contact Information: [phone]  Emotional Assessment Appearance:: Appears stated age Attitude/Demeanor/Rapport: Engaged, Gracious Affect (typically observed): Accepting, Adaptable, Pleasant Orientation: : Oriented to Self, Oriented to  Time, Oriented to Place, Oriented to Situation Alcohol / Substance Use: Not Applicable Psych Involvement: No (comment)  Admission diagnosis:  Fracture [T14.8XXA] Hip  fracture ([place]) [S72.009A] Fall [W19.XXXA] Closed  fracture of neck of right femur, initial encounter ([place]) [S72.001A] Patient Active Problem List   Diagnosis Date Noted  . Closed displaced fracture of right femoral neck ([place]) 01/20/2020  . Fall at home, initial encounter 01/20/2020   PCP:  Leonard Downing, MD Pharmacy:  No Pharmacies Listed   Readmission Risk Interventions Readmission Risk Prevention Plan 01/21/2020  Post Dischage Appt Not Complete  Appt Comments disposition pending  Medication Screening Complete  Transportation Screening Complete

## 2020-01-21 NOTE — Transfer of Care (Signed)
Immediate Anesthesia Transfer of Care Note  Patient: Joshua Shannon  Procedure(s) Performed: ANTERIOR APPROACH HEMI HIP ARTHROPLASTY (Right Hip)  Patient Location: PACU  Anesthesia Type:Spinal  Level of Consciousness: awake, alert , oriented and patient cooperative  Airway & Oxygen Therapy: Patient Spontanous Breathing and Patient connected to nasal cannula oxygen  Post-op Assessment: Report given to RN and Post -op Vital signs reviewed and stable  Post vital signs: Reviewed and stable  Last Vitals:  Vitals Value Taken Time  BP 160/81 01/21/20 1556  Temp    Pulse 78 01/21/20 1557  Resp 9 01/21/20 1557  SpO2 99 % 01/21/20 1557  Vitals shown include unvalidated device data.  Last Pain:  Vitals:   01/21/20 1207  TempSrc:   PainSc: 8          Complications: No apparent anesthesia complications

## 2020-01-21 NOTE — Consult Note (Signed)
Reason for Consult:Right hip fx Referring Physician: Esteban Kobashigawa is an 79 y.o. male.  HPI: Strider fell out of his wheelchair at the facility where he resides. He had immediate pain and could not get up. He was brought to the ED where x-rays showed a femoral neck fx and orthopedic surgery was consulted. He does ambulate with a cane and a 1 person assist.  Past Medical History:  Diagnosis Date  . Depression   . Hemorrhoids   . Hyperlipemia     History reviewed. No pertinent surgical history.  History reviewed. No pertinent family history.  Social History:  reports that he has never smoked. He has never used smokeless tobacco. He reports that he does not use drugs. No history on file for alcohol.  Allergies: No Known Allergies  Medications: I have reviewed the patient's current medications.  Results for orders placed or performed during the hospital encounter of 01/20/20 (from the past 48 hour(s))  SARS CORONAVIRUS 2 (TAT 6-24 HRS) Nasopharyngeal Nasopharyngeal Swab     Status: None   Collection Time: 01/20/20 12:57 AM   Specimen: Nasopharyngeal Swab  Result Value Ref Range   SARS Coronavirus 2 NEGATIVE NEGATIVE    Comment: (NOTE) SARS-CoV-2 target nucleic acids are NOT DETECTED. The SARS-CoV-2 RNA is generally detectable in upper and lower respiratory specimens during the acute phase of infection. Negative results do not preclude SARS-CoV-2 infection, do not rule out co-infections with other pathogens, and should not be used as the sole basis for treatment or other patient management decisions. Negative results must be combined with clinical observations, patient history, and epidemiological information. The expected result is Negative. Fact Sheet for Patients: HairSlick.no Fact Sheet for Healthcare Providers: quierodirigir.com This test is not yet approved or cleared by the [place] FDA and  has been  authorized for detection and/or diagnosis of SARS-CoV-2 by FDA under an Emergency Use Authorization (EUA). This EUA will remain  in effect (meaning this test can be used) for the duration of the COVID-19 declaration under Section 56 4(b)(1) of the Act, 21 U.S.C. section 360bbb-3(b)(1), unless the authorization is terminated or revoked sooner. Performed at [facility], [address], [place], [place] [address]   Basic metabolic panel     Status: Abnormal   Collection Time: 01/20/20  9:36 PM  Result Value Ref Range   Sodium 139 135 - 145 mmol/L   Potassium 4.4 3.5 - 5.1 mmol/L   Chloride 104 98 - 111 mmol/L   CO2 23 22 - 32 mmol/L   Glucose, Bld 113 (H) 70 - 99 mg/dL   BUN 24 (H) 8 - 23 mg/dL   Creatinine, Ser 3.01 0.61 - 1.24 mg/dL   Calcium 9.3 8.9 - 31.4 mg/dL   GFR calc non Af Amer >60 >60 mL/min   GFR calc Af Amer >60 >60 mL/min   Anion gap 12 5 - 15    Comment: Performed at [facility], [address], [place], [place] [address]  CBC with Differential     Status: Abnormal   Collection Time: 01/20/20  9:36 PM  Result Value Ref Range   WBC 10.6 (H) 4.0 - 10.5 K/uL   RBC 4.66 4.22 - 5.81 MIL/uL   Hemoglobin 13.7 13.0 - 17.0 g/dL   HCT 57.9 72.8 - 20.6 %   MCV 90.3 80.0 - 100.0 fL   MCH 29.4 26.0 - 34.0 pg   MCHC 32.5 30.0 - 36.0 g/dL  RDW 13.0 11.5 - 15.5 %   Platelets 203 150 - 400 K/uL   nRBC 0.0 0.0 - 0.2 %   Neutrophils Relative % 88 %   Neutro Abs 9.3 (H) 1.7 - 7.7 K/uL   Lymphocytes Relative 6 %   Lymphs Abs 0.6 (L) 0.7 - 4.0 K/uL   Monocytes Relative 6 %   Monocytes Absolute 0.6 0.1 - 1.0 K/uL   Eosinophils Relative 0 %   Eosinophils Absolute 0.0 0.0 - 0.5 K/uL   Basophils Relative 0 %   Basophils Absolute 0.0 0.0 - 0.1 K/uL   Immature Granulocytes 0 %   Abs Immature Granulocytes 0.04 0.00 - 0.07 K/uL    Comment: Performed at [facility], [address] [place], [place] [address]  Surgical PCR screen     Status: None   Collection  Time: 01/21/20  5:06 AM   Specimen: Nasal Mucosa; Nasal Swab  Result Value Ref Range   MRSA, PCR NEGATIVE NEGATIVE   Staphylococcus aureus NEGATIVE NEGATIVE    Comment: (NOTE) The Xpert SA Assay (FDA approved for NASAL specimens in patients 38 years of age and older), is one component of a comprehensive surveillance program. It is not intended to diagnose infection nor to guide or monitor treatment. Performed at [facility], [address] [place], [place] [address]     DG Chest 1 View  Result Date: 01/20/2020 CLINICAL DATA:  Right hip fracture EXAM: CHEST  1 VIEW COMPARISON:  None. FINDINGS: The heart size and mediastinal contours are within normal limits. Aortic atherosclerosis. Both lungs are clear. The visualized skeletal structures are unremarkable. IMPRESSION: No active disease. Electronically Signed   By: Jasmine Pang M.D.   On: 01/20/2020 20:26   DG Pelvis 1-2 Views  Result Date: 01/20/2020 CLINICAL DATA:  Larey Seat onto right hip EXAM: PELVIS - 1-2 VIEW COMPARISON:  None. FINDINGS: Metallic fragment or hardware over the upper sacrum. Pubic symphysis and rami appear intact. No fracture or malalignment left hip. Acute mildly displaced right femoral neck fracture. IMPRESSION: Acute right femoral neck fracture Electronically Signed   By: Jasmine Pang M.D.   On: 01/20/2020 20:25   DG Femur Min 2 Views Right  Result Date: 01/20/2020 CLINICAL DATA:  Fall onto right hip EXAM: RIGHT FEMUR 2 VIEWS COMPARISON:  None. FINDINGS: Acute right femoral neck fracture. No femoral head dislocation. Mid to distal femur show no fracture or malalignment. Vascular calcifications. IMPRESSION: Acute right femoral neck fracture Electronically Signed   By: Jasmine Pang M.D.   On: 01/20/2020 20:26    Review of Systems  HENT: Negative for ear discharge, ear pain, hearing loss and tinnitus.   Eyes: Negative for photophobia and pain.  Respiratory: Negative for cough and shortness of breath.    Cardiovascular: Negative for chest pain.  Gastrointestinal: Negative for abdominal pain, nausea and vomiting.  Genitourinary: Negative for dysuria, flank pain, frequency and urgency.  Musculoskeletal: Positive for arthralgias (Right hip). Negative for back pain, myalgias and neck pain.  Neurological: Negative for dizziness and headaches.  Hematological: Does not bruise/bleed easily.  Psychiatric/Behavioral: The patient is not nervous/anxious.    Blood pressure (!) 146/88, pulse 97, temperature 98 F (36.7 C), temperature source Oral, resp. rate 17, height 5\' 7"  (1.702 m), weight 58.5 kg, SpO2 97 %. Physical Exam  Constitutional: He appears well-developed and well-nourished. No distress.  HENT:  Head: Normocephalic and atraumatic.  Eyes: Conjunctivae are normal. Right eye exhibits no discharge. Left eye exhibits no  discharge. No scleral icterus.  Cardiovascular: Normal rate and regular rhythm.  Respiratory: Effort normal. No respiratory distress.  Musculoskeletal:     Cervical back: Normal range of motion.     Comments: RLE No traumatic wounds, ecchymosis, or rash  Mild TTP hip  No knee or ankle effusion  Knee stable to varus/ valgus and anterior/posterior stress  Sens DPN, SPN, TN intact  Motor EHL, ext, flex, evers 5/5  DP 2+, PT 2+, No significant edema  Neurological: He is alert.  Skin: Skin is warm and dry. He is not diaphoretic.  Psychiatric: He has a normal mood and affect. His behavior is normal.    Assessment/Plan: Right hip fx -- Plan hemi today with Dr. Erlinda Hong. Please keep NPO. Multiple medical problems including hyperlipidemia, depression, hemorrhoids -- per primary service    Joshua Abu, PA-C Orthopedic Surgery [phone] 01/21/2020, 9:24 AM

## 2020-01-21 NOTE — Progress Notes (Addendum)
PROGRESS NOTE  DWIGHT ADAMCZAK QIH:[id number] DOB: [birth year] DOA: 01/20/2020 PCP: Kaleen Mask, MD  HPI/Recap of past 24 hours: Per admitting OVF:IEPPI L Joshua Shannon is a 79 y.o. male with medical history significant of hyperlipidemia, depression, hemorrhoids presenting to the ED for evaluation of right hip pain after a mechanical fall.  Patient states he fell out of his wheelchair and since then is having pain in his right hip region.  Denies head injury or loss of consciousness.  Denies lightheadedness/dizziness, chest pain, palpitations, or shortness of breath.  No other complaints. In the emergency department patient was found to be hemodynamically stable with with no fever x-ray showed acute right femoral neck fracture orthopedic was called by EDMD  Subjective: Patient seen and examined at bedside.  He was originally DNR but he rescinded and wanted to be full code.  He is supposed to be going to the OR this morning  Assessment/Plan: Principal Problem:   Closed displaced fracture of right femoral neck (HCC) Active Problems:   Fall at home, initial encounter  #1 fall at home  #2 Right femoral neck fracture secondary to mechanical fall Patient is on n.p.o. for possible surgery Ortho has been consulted by admitting MD Continue pain management with morphine as needed and Robaxin as needed Patient is nonweightbearing    Code Status: Full  Severity of Illness: The appropriate patient status for this patient is INPATIENT. Inpatient status is judged to be reasonable and necessary in order to provide the required intensity of service to ensure the patient's safety. The patient's presenting symptoms, physical exam findings, and initial radiographic and laboratory data in the context of their chronic comorbidities is felt to place them at high risk for further clinical deterioration. Furthermore, it is not anticipated that the patient will be medically stable for discharge from the hospital  within 2 midnights of admission. The following factors support the patient status of inpatient.   " The patient's presenting symptoms include fall. " The worrisome physical exam findings include fracture. " The initial radiographic and laboratory data are worrisome because of fall. " The chronic co-morbidities include dementia.   * I certify that at the point of admission it is my clinical judgment that the patient will require inpatient hospital care spanning beyond 2 midnights from the point of admission due to high intensity of service, high risk for further deterioration and high frequency of surveillance required.*    DVT prophylaxis: SCDs at this time, pending orthopedic evaluation Code Status: Patient wishes to be full code Family Communication: No family available at this time. Disposition Plan: Anticipate discharge after clinical improvement. Consults called: Orthopedics Procedures:  Surgery  Antimicrobials:  None   Objective: Vitals:   01/20/20 2036 01/20/20 2332 01/21/20 0358 01/21/20 0801  BP:  (!) 162/83 126/74 (!) 146/88  Pulse:  99 88 97  Resp:  18 15 17   Temp:  98.6 F (37 C) 97.8 F (36.6 C) 98 F (36.7 C)  TempSrc:  Oral Oral Oral  SpO2:  98% 96% 97%  Weight: 58.5 kg     Height: 5\' 7"  (1.702 m)       Intake/Output Summary (Last 24 hours) at 01/21/2020 1116 Last data filed at 01/21/2020 0300 Gross per 24 hour  Intake --  Output 225 ml  Net -225 ml   Filed Weights   01/20/20 2036  Weight: 58.5 kg   Body mass index is 20.2 kg/m.  Exam:  . General: 79 y.o. year-old male well developed  well nourished in no acute distress.  Alert and oriented x3. . Cardiovascular: Regular rate and rhythm with no rubs or gallops.  No thyromegaly or JVD noted.   Marland Kitchen Respiratory: Clear to auscultation with no wheezes or rales. Good inspiratory effort. . Abdomen: Soft nontender nondistended with normal bowel sounds x4 quadrants. . Musculoskeletal: No lower extremity  edema. 2/4 pulses in all 4 extremities. . Skin: No ulcerative lesions noted or rashes, . Psychiatry: Mood is appropriate for condition and setting    Data Reviewed: CBC: Recent Labs  Lab 01/20/20 2136  WBC 10.6*  NEUTROABS 9.3*  HGB 13.7  HCT 42.1  MCV 90.3  PLT 902   Basic Metabolic Panel: Recent Labs  Lab 01/20/20 2136  NA 139  K 4.4  CL 104  CO2 23  GLUCOSE 113*  BUN 24*  CREATININE 0.96  CALCIUM 9.3   GFR: Estimated Creatinine Clearance: 54.2 mL/min (by C-G formula based on SCr of 0.96 mg/dL). Liver Function Tests: No results for input(s): AST, ALT, ALKPHOS, BILITOT, PROT, ALBUMIN in the last 168 hours. No results for input(s): LIPASE, AMYLASE in the last 168 hours. No results for input(s): AMMONIA in the last 168 hours. Coagulation Profile: No results for input(s): INR, PROTIME in the last 168 hours. Cardiac Enzymes: No results for input(s): CKTOTAL, CKMB, CKMBINDEX, TROPONINI in the last 168 hours. BNP (last 3 results) No results for input(s): PROBNP in the last 8760 hours. HbA1C: No results for input(s): HGBA1C in the last 72 hours. CBG: No results for input(s): GLUCAP in the last 168 hours. Lipid Profile: No results for input(s): CHOL, HDL, LDLCALC, TRIG, CHOLHDL, LDLDIRECT in the last 72 hours. Thyroid Function Tests: No results for input(s): TSH, T4TOTAL, FREET4, T3FREE, THYROIDAB in the last 72 hours. Anemia Panel: No results for input(s): VITAMINB12, FOLATE, FERRITIN, TIBC, IRON, RETICCTPCT in the last 72 hours. Urine analysis: No results found for: COLORURINE, APPEARANCEUR, LABSPEC, PHURINE, GLUCOSEU, HGBUR, BILIRUBINUR, KETONESUR, PROTEINUR, UROBILINOGEN, NITRITE, LEUKOCYTESUR Sepsis Labs: @LABRCNTIP (procalcitonin:4,lacticidven:4)  ) Recent Results (from the past 240 hour(s))  SARS CORONAVIRUS 2 (TAT 6-24 HRS) Nasopharyngeal Nasopharyngeal Swab     Status: None   Collection Time: 01/20/20 12:57 AM   Specimen: Nasopharyngeal Swab  Result  Value Ref Range Status   SARS Coronavirus 2 NEGATIVE NEGATIVE Final    Comment: (NOTE) SARS-CoV-2 target nucleic acids are NOT DETECTED. The SARS-CoV-2 RNA is generally detectable in upper and lower respiratory specimens during the acute phase of infection. Negative results do not preclude SARS-CoV-2 infection, do not rule out co-infections with other pathogens, and should not be used as the sole basis for treatment or other patient management decisions. Negative results must be combined with clinical observations, patient history, and epidemiological information. The expected result is Negative. Fact Sheet for Patients: SugarRoll.be Fact Sheet for Healthcare Providers: https://www.woods-mathews.com/ This test is not yet approved or cleared by the [place] FDA and  has been authorized for detection and/or diagnosis of SARS-CoV-2 by FDA under an Emergency Use Authorization (EUA). This EUA will remain  in effect (meaning this test can be used) for the duration of the COVID-19 declaration under Section 56 4(b)(1) of the Act, 21 U.S.C. section 360bbb-3(b)(1), unless the authorization is terminated or revoked sooner. Performed at [facility], [place] [address] [place], [place] [address]   Surgical PCR screen     Status: None   Collection Time: 01/21/20  5:06 AM   Specimen: Nasal Mucosa; Nasal Swab  Result Value Ref Range Status  MRSA, PCR NEGATIVE NEGATIVE Final   Staphylococcus aureus NEGATIVE NEGATIVE Final    Comment: (NOTE) The Xpert SA Assay (FDA approved for NASAL specimens in patients 16 years of age and older), is one component of a comprehensive surveillance program. It is not intended to diagnose infection nor to guide or monitor treatment. Performed at [facility], [address], [place], [place] [address]       Studies: DG Chest 1 View  Result Date: 01/20/2020 CLINICAL DATA:  Right hip fracture EXAM: CHEST   1 VIEW COMPARISON:  None. FINDINGS: The heart size and mediastinal contours are within normal limits. Aortic atherosclerosis. Both lungs are clear. The visualized skeletal structures are unremarkable. IMPRESSION: No active disease. Electronically Signed   By: Jasmine Pang M.D.   On: 01/20/2020 20:26   DG Pelvis 1-2 Views  Result Date: 01/20/2020 CLINICAL DATA:  Larey Seat onto right hip EXAM: PELVIS - 1-2 VIEW COMPARISON:  None. FINDINGS: Metallic fragment or hardware over the upper sacrum. Pubic symphysis and rami appear intact. No fracture or malalignment left hip. Acute mildly displaced right femoral neck fracture. IMPRESSION: Acute right femoral neck fracture Electronically Signed   By: Jasmine Pang M.D.   On: 01/20/2020 20:25   DG Femur Min 2 Views Right  Result Date: 01/20/2020 CLINICAL DATA:  Fall onto right hip EXAM: RIGHT FEMUR 2 VIEWS COMPARISON:  None. FINDINGS: Acute right femoral neck fracture. No femoral head dislocation. Mid to distal femur show no fracture or malalignment. Vascular calcifications. IMPRESSION: Acute right femoral neck fracture Electronically Signed   By: Jasmine Pang M.D.   On: 01/20/2020 20:26    Scheduled Meds: . mupirocin ointment  1 application Nasal BID  . senna  1 tablet Oral BID    Continuous Infusions: . methocarbamol (ROBAXIN) IV       LOS: 1 day     Myrtie Neither, MD [facility]  To reach me or the doctor on call, go to: [email or web address] Password [facility]  01/21/2020, 11:16 AM

## 2020-01-21 NOTE — Progress Notes (Signed)
Spoke with daughter Elease Hashimoto about rehab.  She would like patient to be considered for CIR at [facility].    Mayra Reel, MD [facility] [phone] 3:40 PM

## 2020-01-21 NOTE — Op Note (Signed)
ANTERIOR APPROACH HEMI HIP ARTHROPLASTY  Procedure Note Joshua Shannon   [id number]  Pre-op Diagnosis: right femoral neck fracture.     Post-op Diagnosis: same   Operative Procedures  1. Prosthetic replacement for femoral neck fracture. CPT [phone]  Personnel  Surgeon(s): Tarry Kos, MD  ASSIST: Oneal Grout, PA-C; necessary for the timely completion of procedure and due to complexity of procedure.   Anesthesia: spinal  Prosthesis: Depuy  Femur: Actis 6 STD Head: 50 mm size: +5 Bearing Type: bipolar  Hip Hemiarthroplasty (Anterior Approach) Op Note:  After informed consent was obtained and the operative extremity marked in the holding area, the patient was brought back to the operating room and placed supine on the HANA table. Next, the operative extremity was prepped and draped in normal sterile fashion. Surgical timeout occurred verifying patient identification, surgical site, surgical procedure and administration of antibiotics.  A modified anterior Smith-Peterson approach to the hip was performed, using the interval between tensor fascia lata and sartorius.  Dissection was carried bluntly down onto the anterior hip capsule. The lateral femoral circumflex vessels were identified and coagulated. A capsulotomy was performed and the capsular flaps tagged for later repair.  The neck osteotomy was performed. The femoral head was removed and found a 50 mm head was the appropriate fit.  Soft tissues were cleared around the acetabulum. We then turned our attention to the femur.  After placing the femoral hook, the leg was taken to externally rotated, extended and adducted position taking care to perform soft tissue releases to allow for adequate mobilization of the femur. Soft tissue was cleared from the shoulder of the greater trochanter and the hook elevator used to improve exposure of the proximal femur. Sequential broaching performed up to a size 6. Trial neck and head were  placed. The leg was brought back up to neutral and the construct reduced. The position and sizing of components, offset and leg lengths were checked using fluoroscopy. Stability of the construct was checked in extension and external rotation without any subluxation or impingement of prosthesis. We dislocated the prosthesis, dropped the leg back into position, removed trial components, and irrigated copiously. The final stem and head was then placed, the leg brought back up, the system reduced and fluoroscopy used to verify positioning.  We irrigated, obtained hemostasis and closed the capsule using #2 ethibond suture.  The fascia was closed with #1 vicryl plus, the deep fat layer was closed with 0 vicryl, the subcutaneous layers closed with 2.0 Vicryl Plus and the skin closed with staples. A sterile dressing was applied. The patient was awakened in the operating room and taken to recovery in stable condition. All sponge, needle, and instrument counts were correct at the end of the case.   Position: supine  Complications: see description of procedure.  Time Out: performed   Drains/Packing: none  Estimated blood loss: see anesthesia record  Returned to Recovery Room: in good condition.   Antibiotics: yes   Mechanical VTE (DVT) Prophylaxis: sequential compression devices, TED thigh-high  Chemical VTE (DVT) Prophylaxis: lovenox  Fluid Replacement: Crystalloid: see anesthesia record  Specimens Removed: 1 to pathology   Sponge and Instrument Count Correct? yes   PACU: portable radiograph - low AP   Admission: inpatient status, start PT & OT POD#1  Plan/RTC: Return in 2 weeks for staple removal. Return in 6 weeks to see MD.  Weight Bearing/Load Lower Extremity: full  Hip precautions: none  N. Glee Arvin, MD [facility]  3:32 PM   Implant Name Type Inv. Item Serial No. Manufacturer Lot No. LRB No. Used Action  STEM FEMORAL SZ 6MM STD ACTIS - [id number] Stem STEM FEMORAL SZ 6MM  STD ACTIS  [employer]PU[employer]TH[employer] [id number] Right 1 Implanted  BIPOLAR DEPUY 50MM - [id number] Hips BIPOLAR DEPUY 50MM  DEPUY SYNTHES [id number] Right 1 Implanted  HIP BALL ARTICU DEPUY - [id number] Hips HIP BALL ARTICU DEPUY  DEPUY SYNTHES [id number] Right 1 Implanted

## 2020-01-21 NOTE — Anesthesia Preprocedure Evaluation (Addendum)
Anesthesia Evaluation  Patient identified by MRN, date of birth, ID band Patient awake    Reviewed: Allergy & Precautions, NPO status , Patient's Chart, lab work & pertinent test results  Airway Mallampati: II  TM Distance: >3 FB Neck ROM: Full    Dental no notable dental hx.    Pulmonary neg pulmonary ROS,    Pulmonary exam normal breath sounds clear to auscultation       Cardiovascular negative cardio ROS Normal cardiovascular exam Rhythm:Regular Rate:Normal     Neuro/Psych PSYCHIATRIC DISORDERS Depression CVA (Right sided paralysis ), Residual Symptoms    GI/Hepatic negative GI ROS, Neg liver ROS,   Endo/Other  negative endocrine ROS  Renal/GU negative Renal ROS     Musculoskeletal Wheelchair bound   Abdominal   Peds  Hematology HLD   Anesthesia Other Findings right femoral neck fracture.  Reproductive/Obstetrics                            Anesthesia Physical Anesthesia Plan  ASA: III  Anesthesia Plan: Spinal   Post-op Pain Management:    Induction: Intravenous  PONV Risk Score and Plan: 1 and Ondansetron, Dexamethasone, Propofol infusion and Treatment may vary due to age or medical condition  Airway Management Planned: Natural Airway  Additional Equipment:   Intra-op Plan:   Post-operative Plan:   Informed Consent: I have reviewed the patients History and Physical, chart, labs and discussed the procedure including the risks, benefits and alternatives for the proposed anesthesia with the patient or authorized representative who has indicated his/her understanding and acceptance.   Patient has DNR.  Discussed DNR with patient and Suspend DNR.   Dental advisory given  Plan Discussed with: CRNA  Anesthesia Plan Comments:         Anesthesia Quick Evaluation

## 2020-01-21 NOTE — Progress Notes (Signed)
The patient is presently listed as DNR-  The patient has reported that he has changed his mind this morning and would like for it to be changed to Full Code.  Dr Barrie Folk was sent a secure epic message regarding this information obtained from the patient, before his surgery today.  The patient is aware of the surgeon who will be performing surgery today and was given a time of 1 pm.  I have left a phone message to [place] with the same information.

## 2020-01-21 NOTE — Anesthesia Procedure Notes (Signed)
Spinal  Patient location during procedure: OR Start time: 01/21/2020 1:50 PM End time: 01/21/2020 2:00 PM Staffing Performed: anesthesiologist  Anesthesiologist: Leonides Grills, MD Preanesthetic Checklist Completed: patient identified, IV checked, risks and benefits discussed, surgical consent, monitors and equipment checked, pre-op evaluation and timeout performed Spinal Block Patient position: right lateral decubitus Prep: DuraPrep Patient monitoring: cardiac monitor, continuous pulse ox and blood pressure Approach: midline Location: L4-5 Injection technique: single-shot Needle Needle type: Pencan  Needle gauge: 24 G Needle length: 9 cm Assessment Sensory level: T10 Additional Notes Functioning IV was confirmed and monitors were applied. Sterile prep and drape, including hand hygiene and sterile gloves were used. The patient was positioned and the spine was prepped. The skin was anesthetized with lidocaine.  Free flow of clear CSF was obtained on the second attempt prior to injecting local anesthetic into the CSF.  The spinal needle aspirated freely following injection.  The needle was carefully withdrawn.  The patient tolerated the procedure well.

## 2020-01-21 NOTE — Discharge Instructions (Signed)
° ° °  1. Change dressings as needed °2. May shower but keep incisions covered and dry °3. Take lovenox to prevent blood clots °4. Take stool softeners as needed °5. Take pain meds as needed ° °

## 2020-01-21 NOTE — Progress Notes (Signed)
Report called to [facility], RN in Short Stay.  Patient is ready for surgery and has a foley cath in place

## 2020-01-21 NOTE — Anesthesia Procedure Notes (Signed)
Procedure Name: MAC Date/Time: 01/21/2020 2:07 PM Performed by: Colin Benton, CRNA Pre-anesthesia Checklist: Patient identified, Emergency Drugs available, Suction available and Patient being monitored Patient Re-evaluated:Patient Re-evaluated prior to induction Oxygen Delivery Method: Nasal cannula Induction Type: IV induction Placement Confirmation: positive ETCO2 Dental Injury: Teeth and Oropharynx as per pre-operative assessment

## 2020-01-22 LAB — BASIC METABOLIC PANEL
Anion gap: 13 (ref 5–15)
BUN: 20 mg/dL (ref 8–23)
CO2: 23 mmol/L (ref 22–32)
Calcium: 8.6 mg/dL — ABNORMAL LOW (ref 8.9–10.3)
Chloride: 98 mmol/L (ref 98–111)
Creatinine, Ser: 0.99 mg/dL (ref 0.61–1.24)
GFR calc Af Amer: >60 mL/min (ref >60)
GFR calc non Af Amer: >60 mL/min (ref >60)
Glucose, Bld: 139 mg/dL — ABNORMAL HIGH (ref 70–99)
Potassium: 3.8 mmol/L (ref 3.5–5.1)
Sodium: 134 mmol/L — ABNORMAL LOW (ref 135–145)

## 2020-01-22 LAB — CBC
HCT: 37.7 % — ABNORMAL LOW (ref 39.0–52.0)
Hemoglobin: 12.3 g/dL — ABNORMAL LOW (ref 13.0–17.0)
MCH: 29.4 pg (ref 26.0–34.0)
MCHC: 32.6 g/dL (ref 30.0–36.0)
MCV: 90 fL (ref 80.0–100.0)
Platelets: 184 10*3/uL (ref 150–400)
RBC: 4.19 MIL/uL — ABNORMAL LOW (ref 4.22–5.81)
RDW: 12.8 % (ref 11.5–15.5)
WBC: 12.8 10*3/uL — ABNORMAL HIGH (ref 4.0–10.5)
nRBC: 0 % (ref 0.0–0.2)

## 2020-01-22 MED ORDER — EYE WASH OPHTH SOLN
1.0000 [drp] | Freq: Two times a day (BID) | OPHTHALMIC | Status: DC
  Administered 2020-01-22 – 2020-01-24 (×4): 1 [drp] via OPHTHALMIC
  Filled 2020-01-22: qty 118

## 2020-01-22 MED ORDER — BISACODYL 10 MG RE SUPP
10.0000 mg | Freq: Once | RECTAL | Status: AC
  Administered 2020-01-22: 10 mg via RECTAL
  Filled 2020-01-22: qty 1

## 2020-01-22 NOTE — Progress Notes (Signed)
Indwelling foley cath removed= tol well

## 2020-01-22 NOTE — Plan of Care (Signed)
  Problem: Activity: Goal: Ability to avoid complications of mobility impairment will improve Outcome: Progressing   Problem: Activity: Goal: Ability to tolerate increased activity will improve Outcome: Progressing   

## 2020-01-22 NOTE — Progress Notes (Signed)
   Subjective:  Patient reports pain as mild.  Just surgical soreness.  Objective:   VITALS:   Vitals:   01/21/20 1706 01/21/20 1927 01/21/20 2359 01/22/20 0446  BP: (!) 162/81 (!) 152/85 (!) 152/82 140/85  Pulse: 90 84 91 99  Resp: 16 14 15 17   Temp: 97.7 F (36.5 C) 98.3 F (36.8 C) 97.9 F (36.6 C) 98.1 F (36.7 C)  TempSrc: Oral     SpO2: 96%  97% 94%  Weight:      Height:        Neurologically intact Neurovascular intact Sensation intact distally Intact pulses distally Dorsiflexion/Plantar flexion intact Incision: dressing C/D/I and no drainage No cellulitis present Compartment soft   Lab Results  Component Value Date   WBC 12.8 (H) 01/22/2020   HGB 12.3 (L) 01/22/2020   HCT 37.7 (L) 01/22/2020   MCV 90.0 01/22/2020   PLT 184 01/22/2020     Assessment/Plan:  1 Day Post-Op   - Expected postop acute blood loss anemia - will monitor for symptoms - Up with PT/OT - family would like patient to go to CIR - DVT ppx - SCDs, ambulation, lovenox - WBAT operative extremity, no hip precautions - foley out today  01/24/2020 01/22/2020, 9:58 AM [phone]

## 2020-01-22 NOTE — Psychosocial Assessment (Addendum)
Rehab Admissions Coordinator Note:  Patient was screened by Clois Dupes for appropriateness for an Inpatient Acute Rehab Consult per PT and OT recs.   At this time, we are recommending inpt rehab consult..I will place order for consult per protocol.  Clois Dupes RN MSN 01/22/2020, 4:16 PM  I can be reached at [phone].

## 2020-01-22 NOTE — Progress Notes (Signed)
PROGRESS NOTE  WOLF BOULAY YHC:[id number] DOB: [birth year] DOA: 01/20/2020 PCP: Kaleen Mask, MD  HPI/Recap of past 24 hours: Per admitting DVV:OHYWV L Joshua Shannon is a 79 y.o. male with medical history significant of hyperlipidemia, depression, hemorrhoids presenting to the ED for evaluation of right hip pain after a mechanical fall.  Patient states he fell out of his wheelchair and since then is having pain in his right hip region.  Denies head injury or loss of consciousness.  Denies lightheadedness/dizziness, chest pain, palpitations, or shortness of breath.  No other complaints. In the emergency department patient was found to be hemodynamically stable with with no fever x-ray showed acute right femoral neck fracture orthopedic was called by EDMD  Subjective: Patient seen and examined at bedside.  He was originally DNR but he rescinded and wanted to be full code.  He is supposed to be going to the OR this morning  January 22, 2020. Subjective: Patient seen and examined at bedside.  He is status post day 1 of right femoral neck surgery secondary to fracture due to fall.  He is started his physical therapy today he feels a little tired.  He is complaining of dry eye so we will start on artificial tears  Assessment/Plan: Principal Problem:   Closed displaced fracture of right femoral neck (HCC) Active Problems:   Fall at home, initial encounter  #1 fall at home  #2 Right femoral neck fracture secondary to mechanical fall Patient is on n.p.o. for possible surgery Ortho has been consulted by admitting MD Continue pain management with morphine as needed and Robaxin as needed Patient is nonweightbearing    Code Status: Full  Severity of Illness: The appropriate patient status for this patient is INPATIENT. Inpatient status is judged to be reasonable and necessary in order to provide the required intensity of service to ensure the patient's safety. The patient's presenting  symptoms, physical exam findings, and initial radiographic and laboratory data in the context of their chronic comorbidities is felt to place them at high risk for further clinical deterioration. Furthermore, it is not anticipated that the patient will be medically stable for discharge from the hospital within 2 midnights of admission. The following factors support the patient status of inpatient.   " The patient's presenting symptoms include fall. " The worrisome physical exam findings include fracture. " The initial radiographic and laboratory data are worrisome because of fall. " The chronic co-morbidities include dementia.   * I certify that at the point of admission it is my clinical judgment that the patient will require inpatient hospital care spanning beyond 2 midnights from the point of admission due to high intensity of service, high risk for further deterioration and high frequency of surveillance required.*    DVT prophylaxis: SCDs and Lovenox Code Status: Patient wishes to be full code Family Communication: No family available at this time. Disposition Plan: Anticipate discharge to CIR Consults called: Orthopedics  Procedures: Prosthetic replacement for femoral neck fracture. CPT 27236   Antimicrobials:  None   Objective: Vitals:   01/21/20 2359 01/22/20 0446 01/22/20 1243 01/22/20 1926  BP: (!) 152/82 140/85 112/63 134/67  Pulse: 91 99 95 94  Resp: 15 17 16    Temp: 97.9 F (36.6 C) 98.1 F (36.7 C) 97.8 F (36.6 C) 98.7 F (37.1 C)  TempSrc:   Oral Oral  SpO2: 97% 94% 94% 98%  Weight:      Height:        Intake/Output Summary (Last  24 hours) at 01/22/2020 1936 Last data filed at 01/22/2020 1420 Gross per 24 hour  Intake 1650.1 ml  Output 725 ml  Net 925.1 ml   Filed Weights   01/20/20 2036 01/21/20 1329  Weight: 58.5 kg 58.5 kg   Body mass index is 20.2 kg/m.  Exam:  . General: 79 y.o. year-old male well developed well nourished in no acute  distress.  Alert and oriented x3. . Cardiovascular: Regular rate and rhythm with no rubs or gallops.  No thyromegaly or JVD noted.   Marland Kitchen Respiratory: Clear to auscultation with no wheezes or rales. Good inspiratory effort. . Abdomen: Soft nontender nondistended with normal bowel sounds x4 quadrants. . Musculoskeletal: No lower extremity edema. 2/4 pulses in all 4 extremities. . Skin: No ulcerative lesions noted or rashes, . Psychiatry: Mood is appropriate for condition and setting    Data Reviewed: CBC: Recent Labs  Lab 01/20/20 2136 01/21/20 1808 01/22/20 0533  WBC 10.6* 9.3 12.8*  NEUTROABS 9.3*  --   --   HGB 13.7 13.0 12.3*  HCT 42.1 39.7 37.7*  MCV 90.3 89.8 90.0  PLT 203 181 401   Basic Metabolic Panel: Recent Labs  Lab 01/20/20 2136 01/21/20 1808 01/22/20 0533  NA 139  --  134*  K 4.4  --  3.8  CL 104  --  98  CO2 23  --  23  GLUCOSE 113*  --  139*  BUN 24*  --  20  CREATININE 0.96 0.97 0.99  CALCIUM 9.3  --  8.6*   GFR: Estimated Creatinine Clearance: 52.5 mL/min (by C-G formula based on SCr of 0.99 mg/dL). Liver Function Tests: No results for input(s): AST, ALT, ALKPHOS, BILITOT, PROT, ALBUMIN in the last 168 hours. No results for input(s): LIPASE, AMYLASE in the last 168 hours. No results for input(s): AMMONIA in the last 168 hours. Coagulation Profile: No results for input(s): INR, PROTIME in the last 168 hours. Cardiac Enzymes: No results for input(s): CKTOTAL, CKMB, CKMBINDEX, TROPONINI in the last 168 hours. BNP (last 3 results) No results for input(s): PROBNP in the last 8760 hours. HbA1C: No results for input(s): HGBA1C in the last 72 hours. CBG: No results for input(s): GLUCAP in the last 168 hours. Lipid Profile: No results for input(s): CHOL, HDL, LDLCALC, TRIG, CHOLHDL, LDLDIRECT in the last 72 hours. Thyroid Function Tests: No results for input(s): TSH, T4TOTAL, FREET4, T3FREE, THYROIDAB in the last 72 hours. Anemia Panel: No results for  input(s): VITAMINB12, FOLATE, FERRITIN, TIBC, IRON, RETICCTPCT in the last 72 hours. Urine analysis: No results found for: COLORURINE, APPEARANCEUR, LABSPEC, PHURINE, GLUCOSEU, HGBUR, BILIRUBINUR, KETONESUR, PROTEINUR, UROBILINOGEN, NITRITE, LEUKOCYTESUR Sepsis Labs: @LABRCNTIP (procalcitonin:4,lacticidven:4)  ) Recent Results (from the past 240 hour(s))  SARS CORONAVIRUS 2 (TAT 6-24 HRS) Nasopharyngeal Nasopharyngeal Swab     Status: None   Collection Time: 01/20/20 12:57 AM   Specimen: Nasopharyngeal Swab  Result Value Ref Range Status   SARS Coronavirus 2 NEGATIVE NEGATIVE Final    Comment: (NOTE) SARS-CoV-2 target nucleic acids are NOT DETECTED. The SARS-CoV-2 RNA is generally detectable in upper and lower respiratory specimens during the acute phase of infection. Negative results do not preclude SARS-CoV-2 infection, do not rule out co-infections with other pathogens, and should not be used as the sole basis for treatment or other patient management decisions. Negative results must be combined with clinical observations, patient history, and epidemiological information. The expected result is Negative. Fact Sheet for Patients: SugarRoll.be Fact Sheet for Healthcare Providers: https://www.woods-mathews.com/ This  test is not yet approved or cleared by the [place] and  has been authorized for detection and/or diagnosis of SARS-CoV-2 by FDA under an Emergency Use Authorization (EUA). This EUA will remain  in effect (meaning this test can be used) for the duration of the COVID-19 declaration under Section 56 4(b)(1) of the Act, 21 U.S.C. section 360bbb-3(b)(1), unless the authorization is terminated or revoked sooner. Performed at [facility], [address], [place], [place] [address]   Surgical PCR screen     Status: None   Collection Time: 01/21/20  5:06 AM   Specimen: Nasal Mucosa; Nasal Swab  Result Value Ref Range  Status   MRSA, PCR NEGATIVE NEGATIVE Final   Staphylococcus aureus NEGATIVE NEGATIVE Final    Comment: (NOTE) The Xpert SA Assay (FDA approved for NASAL specimens in patients 77 years of age and older), is one component of a comprehensive surveillance program. It is not intended to diagnose infection nor to guide or monitor treatment. Performed at [facility], [address], [place], [place] [address]       Studies: No results found.  Scheduled Meds: . docusate sodium  100 mg Oral BID  . enoxaparin (LOVENOX) injection  40 mg Subcutaneous Q24H  . eye wash  1 drop Both Eyes BID  . mupirocin ointment  1 application Nasal BID  . senna  1 tablet Oral BID    Continuous Infusions: . sodium chloride 75 mL/hr at 01/22/20 1420  . lactated ringers 10 mL/hr at 01/21/20 1347  . lactated ringers    . methocarbamol (ROBAXIN) IV       LOS: 2 days     Myrtie Neither, MD [facility]  To reach me or the doctor on call, go to: [email or web address] Password [facility]  01/22/2020, 7:36 PM

## 2020-01-22 NOTE — Evaluation (Addendum)
Physical Therapy Evaluation Patient Details Name: Joshua Shannon MRN: [id number] DOB: [birth year] Today's Date: 01/22/2020   History of Present Illness  Pt is a 79 yo male admitted after falling out of w/c resulting in R femur fx requiring R anterior hip hemiarthroplasty. WBAT RLE. 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    Clinical Impression  Pt presented supine in bed with HOB elevated, awake and willing to participate in therapy session. Prior to admission, pt reported that he primarily used a w/c for mobility and could perform transfers independently. Pt's daughter stating that pt could also ambulate ~500' with min guard from PT during sessions at his ALF. Spoke to daughter via telephone after session regarding his current functional mobility status. At the time of evaluation, pt very limited with mobility secondary to pain and R sided weakness (combination of R hip fx and recent CVA in May of 2020). He currently requires heavy physical assistance for two for all mobility. Pt and pt's daughter are very hopeful that he can go to CIR for further intensive therapy services prior to returning to his ALF. If he is not able to get into CIR, he will need short-term rehab at [facility] before returning to his ALF. PT will continue to follow pt acutely to progress mobility as tolerated per PT POC.    Follow Up Recommendations CIR;Other (comment)(daughter really hopeful for CIR; if denied will need SNF)    Equipment Recommendations  None recommended by PT    Recommendations for Other Services       Precautions / Restrictions Precautions Precautions: Fall Precaution Comments: residual R sided weakness from previous CVA in May of 2020 Restrictions Weight Bearing Restrictions: Yes RLE Weight Bearing: Weight bearing as tolerated Other Position/Activity Restrictions: WBAT RLE      Mobility  Bed Mobility Overal bed mobility: Needs Assistance Bed Mobility: Supine to Sit Rolling: Max  assist;+2 for physical assistance   Supine to sit: Max assist;+2 for physical assistance     General bed mobility comments: assistance needed for R LE movement (pt attempting to use L UE to assist R LE movement towards EOB); heavy assist needed for trunk elevation and use of bed pads to position pt's hips at EOB  Transfers Overall transfer level: Needs assistance Equipment used: 2 person hand held assist Transfers: Stand Pivot Transfers;Sit to/from Stand Sit to Stand: Max assist;+2 physical assistance;+2 safety/equipment Stand pivot transfers: Total assist;+2 physical assistance       General transfer comment: pt able to help minimally to get into standing; however, was unable to advance either LE in any direction during the pivot and therefore required total A x2 to pivot from bed to chair towards pt's L side  Ambulation/Gait                Stairs            Wheelchair Mobility    Modified Rankin (Stroke Patients Only)       Balance Overall balance assessment: Needs assistance Sitting-balance support: Bilateral upper extremity supported;Feet supported Sitting balance-Leahy Scale: Poor Sitting balance - Comments: Intermittent assist required   Standing balance support: Bilateral upper extremity supported Standing balance-Leahy Scale: Poor Standing balance comment: max-total A x2                             Pertinent Vitals/Pain Pain Assessment: Faces Faces Pain Scale: Hurts little more Pain Location: R hip pain Pain  Descriptors / Indicators: Discomfort;Guarding Pain Intervention(s): Monitored during session;Repositioned    Home Living Family/patient expects to be discharged to:: Assisted living               Home Equipment: Wheelchair - manual      Prior Function Level of Independence: Needs assistance   Gait / Transfers Assistance Needed: pt was ambulating with PT ~500' per daughter with min guard; primarily uses w/c for  mobility  ADL's / Homemaking Assistance Needed: pt reporting that he performed mostly himself        Hand Dominance   Dominant Hand: Left    Extremity/Trunk Assessment   Upper Extremity Assessment Upper Extremity Assessment: Defer to OT evaluation RUE Deficits / Details: residual deficits from CVA LUE Deficits / Details: 3+/5 MM grade overall    Lower Extremity Assessment Lower Extremity Assessment: Generalized weakness;RLE deficits/detail RLE Deficits / Details: pt with decreased strength and ROM limitations secondary to post-op pain and weakness; pt also with R sided weakness from previous CVA    Cervical / Trunk Assessment Cervical / Trunk Assessment: Kyphotic  Communication   Communication: No difficulties  Cognition Arousal/Alertness: Awake/alert Behavior During Therapy: WFL for tasks assessed/performed Overall Cognitive Status: Within Functional Limits for tasks assessed                                        General Comments General comments (skin integrity, edema, etc.): PT called daughter after session to relay information regarding therapy progress.    Exercises     Assessment/Plan    PT Assessment Patient needs continued PT services  PT Problem List Decreased strength;Decreased range of motion;Decreased activity tolerance;Decreased balance;Decreased mobility;Decreased coordination;Decreased knowledge of use of DME;Decreased safety awareness;Decreased knowledge of precautions;Pain       PT Treatment Interventions Gait training;DME instruction;Functional mobility training;Therapeutic activities;Therapeutic exercise;Balance training;Neuromuscular re-education;Patient/family education    PT Goals (Current goals can be found in the Care Plan section)  Acute Rehab PT Goals Patient Stated Goal: to go to rehab PT Goal Formulation: With patient/family Time For Goal Achievement: 02/05/20 Potential to Achieve Goals: Good    Frequency Min  3X/week   Barriers to discharge        Co-evaluation PT/OT/SLP Co-Evaluation/Treatment: Yes Reason for Co-Treatment: For patient/therapist safety;To address functional/ADL transfers PT goals addressed during session: Mobility/safety with mobility;Balance;Strengthening/ROM OT goals addressed during session: ADL's and self-care;Strengthening/ROM       AM-PAC PT "6 Clicks" Mobility  Outcome Measure Help needed turning from your back to your side while in a flat bed without using bedrails?: Total Help needed moving from lying on your back to sitting on the side of a flat bed without using bedrails?: Total Help needed moving to and from a bed to a chair (including a wheelchair)?: Total Help needed standing up from a chair using your arms (e.g., wheelchair or bedside chair)?: Total Help needed to walk in hospital room?: Total Help needed climbing 3-5 steps with a railing? : Total 6 Click Score: 6    End of Session Equipment Utilized During Treatment: Gait belt Activity Tolerance: Patient tolerated treatment well Patient left: in chair;with call bell/phone within reach;with chair alarm set Nurse Communication: Mobility status;Need for lift equipment PT Visit Diagnosis: Other abnormalities of gait and mobility (R26.89);Pain Pain - Right/Left: Right Pain - part of body: Hip    Time: 9326-7124 PT Time Calculation (min) (ACUTE ONLY): 26 min  Charges:   PT Evaluation $PT Eval Moderate Complexity: 1 Mod          Ginette Pitman, PT, DPT  Acute Rehabilitation Services Pager [phone] Office [phone]    Alessandra Bevels Chanson Teems 01/22/2020, 1:31 PM

## 2020-01-22 NOTE — Evaluation (Addendum)
Occupational Therapy Evaluation Patient Details Name: Joshua Shannon MRN: [id number] DOB: 19-Aug-1941 Today's Date: 01/22/2020    History of Present Illness Pt is a 79 yo male s/p fall out of w/c resulting in R femur fx requiring R anterior hip hemiarthroplasty. WBAT RLE. PMhx: CVA with R sided residual weakness.   Clinical Impression   Pt PTA: reports living in ALF and was mostly independent with ADL; assist from PT with ambulation with RW, otherwise, w/c bound. Pt currently limited by increased pain, decreased mobility and decreased ability to care for self safely. Pt limited by weakness on R side due to previous CVA and pain in R hip. Pt minA to [facility] for ADL at this time due to limitations from pain. Pt maxA +2 for stand pivot transfer. Pt would benefit from continued OT skilled services for ADL, mobility and safety in CIR setting. OT following acutely.    Follow Up Recommendations  CIR;Supervision/Assistance - 24 hour    Equipment Recommendations  Other (comment)(to be determined)    Recommendations for Other Services       Precautions / Restrictions Precautions Precautions: Fall Restrictions Weight Bearing Restrictions: Yes Other Position/Activity Restrictions: WBAT RLE      Mobility Bed Mobility Overal bed mobility: Needs Assistance Bed Mobility: Rolling;Supine to Sit Rolling: Max assist;+2 for physical assistance   Supine to sit: Max assist;+2 for physical assistance     General bed mobility comments: assist for BLE management and trunk elevation  Transfers Overall transfer level: Needs assistance Equipment used: 2 person hand held assist Transfers: Stand Pivot Transfers;Sit to/from Stand Sit to Stand: Max assist;+2 physical assistance;+2 safety/equipment Stand pivot transfers: Max assist;+2 physical assistance;+2 safety/equipment;From elevated surface       General transfer comment: Pt limited by weakness on R side due to previous CVA and pain in R hip.     Balance Overall balance assessment: Needs assistance Sitting-balance support: Bilateral upper extremity supported;Feet supported Sitting balance-Leahy Scale: Poor Sitting balance - Comments: Intermittent assist required   Standing balance support: Bilateral upper extremity supported Standing balance-Leahy Scale: Poor                             ADL either performed or assessed with clinical judgement   ADL Overall ADL's : Needs assistance/impaired Eating/Feeding: Set up;Sitting   Grooming: Set up;Sitting   Upper Body Bathing: Moderate assistance;Sitting   Lower Body Bathing: Maximal assistance;Total assistance;+2 for physical assistance;Cueing for safety;Cueing for sequencing;Sitting/lateral leans;Sit to/from stand   Upper Body Dressing : Maximal assistance;Total assistance;Cueing for safety;Sitting;Standing   Lower Body Dressing: Maximal assistance;Total assistance;+2 for physical assistance;Cueing for safety;Cueing for sequencing;Sitting/lateral leans;Sit to/from stand   Toilet Transfer: Maximal assistance;+2 for physical assistance;Squat-pivot;BSC   Toileting- Clothing Manipulation and Hygiene: Maximal assistance;Total assistance;Cueing for safety;+2 for physical assistance;Sitting/lateral lean;Sit to/from stand       Functional mobility during ADLs: Maximal assistance;+2 for physical assistance;+2 for safety/equipment;Cueing for safety;Cueing for sequencing General ADL Comments: Pt limited by increased pain, decreased mobility and decreased ability to care for self safely.     Vision Baseline Vision/History: No visual deficits Patient Visual Report: No change from baseline Vision Assessment?: No apparent visual deficits     Perception     Praxis      Pertinent Vitals/Pain Pain Assessment: Faces Faces Pain Scale: Hurts little more Pain Location: R hip pain Pain Descriptors / Indicators: Discomfort;Throbbing Pain Intervention(s): Monitored during  session;Premedicated before session;Repositioned     Hand  Dominance Left   Extremity/Trunk Assessment Upper Extremity Assessment Upper Extremity Assessment: RUE deficits/detail;LUE deficits/detail RUE Deficits / Details: residual deficits from CVA LUE Deficits / Details: 3+/5 MM grade overall   Lower Extremity Assessment Lower Extremity Assessment: Generalized weakness;RLE deficits/detail RLE Deficits / Details: R hip pain s/p repair   Cervical / Trunk Assessment Cervical / Trunk Assessment: Kyphotic   Communication Communication Communication: No difficulties;Other (comment)(soft voice)   Cognition Arousal/Alertness: Awake/alert Behavior During Therapy: WFL for tasks assessed/performed Overall Cognitive Status: Within Functional Limits for tasks assessed                                     General Comments  PT called daughter after session to relay information regarding therapy progress.    Exercises     Shoulder Instructions      Home Living Family/patient expects to be discharged to:: Assisted living                             Home Equipment: Wheelchair - manual          Prior Functioning/Environment Level of Independence: Needs assistance  Gait / Transfers Assistance Needed: Ambulating with therapy with RW; W/C with staff- able to wheel self ADL's / Homemaking Assistance Needed: Assist with ADL; pt repots that he did most of it himself            OT Problem List: Decreased strength;Decreased activity tolerance;Impaired balance (sitting and/or standing);Decreased safety awareness;Decreased knowledge of use of DME or AE;Impaired UE functional use;Pain;Increased edema      OT Treatment/Interventions: Self-care/ADL training;Therapeutic exercise;Energy conservation;DME and/or AE instruction;Therapeutic activities;Patient/family education;Balance training    OT Goals(Current goals can be found in the care plan section) Acute Rehab OT  Goals Patient Stated Goal: to go rehab OT Goal Formulation: With patient Time For Goal Achievement: 02/05/20 Potential to Achieve Goals: Good ADL Goals Pt Will Perform Grooming: with set-up;sitting Pt Will Perform Lower Body Dressing: with max assist;sitting/lateral leans;sit to/from stand;bed level Pt Will Transfer to Toilet: with mod assist;stand pivot transfer;bedside commode Pt Will Perform Toileting - Clothing Manipulation and hygiene: with max assist;sitting/lateral leans;sit to/from stand Pt/caregiver will Perform Home Exercise Program: Increased strength;Both right and left upper extremity;With minimal assist  OT Frequency: Min 2X/week   Barriers to D/C:            Co-evaluation PT/OT/SLP Co-Evaluation/Treatment: Yes Reason for Co-Treatment: Complexity of the patient's impairments (multi-system involvement);To address functional/ADL transfers   OT goals addressed during session: ADL's and self-care;Strengthening/ROM      AM-PAC OT "6 Clicks" Daily Activity     Outcome Measure Help from another person eating meals?: A Little Help from another person taking care of personal grooming?: A Little Help from another person toileting, which includes using toliet, bedpan, or urinal?: Total Help from another person bathing (including washing, rinsing, drying)?: A Lot Help from another person to put on and taking off regular upper body clothing?: A Lot Help from another person to put on and taking off regular lower body clothing?: Total 6 Click Score: 12   End of Session Equipment Utilized During Treatment: Gait belt Nurse Communication: Mobility status;Need for lift equipment(stedy)  Activity Tolerance: Patient limited by pain Patient left: in chair;with call bell/phone within reach;with bed alarm set  OT Visit Diagnosis: Unsteadiness on feet (R26.81);Muscle weakness (generalized) (M62.81);Pain Pain - Right/Left: Right Pain -  part of body: Hip                Time:  6153-7943 OT Time Calculation (min): 31 min Charges:  OT General Charges $OT Visit: 1 Visit OT Evaluation $OT Eval Moderate Complexity: 1 Mod OT Treatments $Self Care/Home Management : 8-22 mins  Flora Lipps OTR/L Acute Rehabilitation Services Pager: [phone] Office: [phone]   Ica Daye C 01/22/2020, 12:37 PM

## 2020-01-23 LAB — CBC
HCT: 33.5 % — ABNORMAL LOW (ref 39.0–52.0)
Hemoglobin: 11 g/dL — ABNORMAL LOW (ref 13.0–17.0)
MCH: 29.5 pg (ref 26.0–34.0)
MCHC: 32.8 g/dL (ref 30.0–36.0)
MCV: 89.8 fL (ref 80.0–100.0)
Platelets: 152 10*3/uL (ref 150–400)
RBC: 3.73 MIL/uL — ABNORMAL LOW (ref 4.22–5.81)
RDW: 12.8 % (ref 11.5–15.5)
WBC: 11.2 10*3/uL — ABNORMAL HIGH (ref 4.0–10.5)
nRBC: 0 % (ref 0.0–0.2)

## 2020-01-23 LAB — BASIC METABOLIC PANEL
Anion gap: 10 (ref 5–15)
BUN: 19 mg/dL (ref 8–23)
CO2: 24 mmol/L (ref 22–32)
Calcium: 8.4 mg/dL — ABNORMAL LOW (ref 8.9–10.3)
Chloride: 101 mmol/L (ref 98–111)
Creatinine, Ser: 0.98 mg/dL (ref 0.61–1.24)
GFR calc Af Amer: >60 mL/min (ref >60)
GFR calc non Af Amer: >60 mL/min (ref >60)
Glucose, Bld: 107 mg/dL — ABNORMAL HIGH (ref 70–99)
Potassium: 3.8 mmol/L (ref 3.5–5.1)
Sodium: 135 mmol/L (ref 135–145)

## 2020-01-23 NOTE — Progress Notes (Signed)
Patient ID: Joshua Shannon, male   DOB: 26-Dec-1940, 79 y.o.   MRN: [id number] Patient is status post right hip hemiarthroplasty.  He is sitting up in bed without complaints anticipate discharge to rehab.

## 2020-01-23 NOTE — Plan of Care (Signed)
  Problem: Activity: Goal: Ability to avoid complications of mobility impairment will improve 01/23/2020 1052 by Darrow Bussing, RN Outcome: Progressing 01/23/2020 0828 by Darrow Bussing, RN Outcome: Progressing   Problem: Education: Goal: Verbalization of understanding the information provided will improve 01/23/2020 1052 by Darrow Bussing, RN Outcome: Progressing 01/23/2020 0828 by Darrow Bussing, RN Outcome: Progressing   Problem: Respiratory: Goal: Ability to maintain a clear airway will improve 01/23/2020 1052 by Darrow Bussing, RN Outcome: Progressing 01/23/2020 0828 by Darrow Bussing, RN Outcome: Progressing   Problem: Pain Management: Goal: Pain level will decrease Outcome: Progressing   Problem: Skin Integrity: Goal: Signs of wound healing will improve 01/23/2020 1052 by Darrow Bussing, RN Outcome: Progressing 01/23/2020 0828 by Darrow Bussing, RN Outcome: Progressing

## 2020-01-23 NOTE — Progress Notes (Signed)
PROGRESS NOTE  LEDGER HEINDL DXI:[id number] DOB: September 23, 1941 DOA: 01/20/2020 PCP: Leonard Downing, MD  HPI/Recap of past 24 hours: Per admitting JQB:HALPF L Lo is a 79 y.o. male with medical history significant of hyperlipidemia, depression, hemorrhoids presenting to the ED for evaluation of right hip pain after a mechanical fall.  Patient states he fell out of his wheelchair and since then is having pain in his right hip region.  Denies head injury or loss of consciousness.  Denies lightheadedness/dizziness, chest pain, palpitations, or shortness of breath.  No other complaints. In the emergency department patient was found to be hemodynamically stable with with no fever x-ray showed acute right femoral neck fracture orthopedic was called by EDMD  Subjective: Patient seen and examined at bedside.  He was originally DNR but he rescinded and wanted to be full code.  He is supposed to be going to the OR this morning  January 22, 2020. Subjective: Patient seen and examined at bedside.  He is status post day 1 of right femoral neck surgery secondary to fracture due to fall.  He is started his physical therapy today he feels a little tired.  He is complaining of dry eye so we will start on artificial tears  January 23, 2020. Subjective: Patient seen and examined at bedside he is a little confused this morning he thought that he was somewhere else earlier he was saying are not I got here but after I reoriented him he got better he knew that he is from [facility] home and that he had right hip surgery and he is waiting to go to rehab.  Assessment/Plan: Principal Problem:   Closed displaced fracture of right femoral neck (HCC) Active Problems:   Fall at home, initial encounter  #1 fall at home  #2 Right femoral neck fracture secondary to mechanical fall Patient is on n.p.o. for possible surgery Ortho has been consulted by admitting MD Continue pain management with morphine as needed  and Robaxin as needed Patient is nonweightbearing    Code Status: Full  Severity of Illness: The appropriate patient status for this patient is INPATIENT. Inpatient status is judged to be reasonable and necessary in order to provide the required intensity of service to ensure the patient's safety. The patient's presenting symptoms, physical exam findings, and initial radiographic and laboratory data in the context of their chronic comorbidities is felt to place them at high risk for further clinical deterioration. Furthermore, it is not anticipated that the patient will be medically stable for discharge from the hospital within 2 midnights of admission. The following factors support the patient status of inpatient.   " The patient's presenting symptoms include fall. " The worrisome physical exam findings include fracture. " The initial radiographic and laboratory data are worrisome because of fall. " The chronic co-morbidities include dementia.   * I certify that at the point of admission it is my clinical judgment that the patient will require inpatient hospital care spanning beyond 2 midnights from the point of admission due to high intensity of service, high risk for further deterioration and high frequency of surveillance required.*    DVT prophylaxis: SCDs and Lovenox Code Status: Patient wishes to be full code Family Communication: No family available at this time. Disposition Plan: Anticipate discharge to CIR Consults called: Orthopedics  Procedures: Prosthetic replacement for femoral neck fracture. CPT [id number]   Antimicrobials:  None   Objective: Vitals:   01/22/20 1243 01/22/20 1926 01/23/20 0414 01/23/20 1112  BP: 112/63 134/67 134/69 122/71  Pulse: 95 94 96 94  Resp: 16   16  Temp: 97.8 F (36.6 C) 98.7 F (37.1 C) 98.4 F (36.9 C) 99.1 F (37.3 C)  TempSrc: Oral Oral Oral Oral  SpO2: 94% 98% 93% 99%  Weight:      Height:        Intake/Output Summary (Last  24 hours) at 01/23/2020 1152 Last data filed at 01/23/2020 1100 Gross per 24 hour  Intake 3433.21 ml  Output 377 ml  Net 3056.21 ml   Filed Weights   01/20/20 2036 01/21/20 1329  Weight: 58.5 kg 58.5 kg   Body mass index is 20.2 kg/m.  Exam:  . General: 79 y.o. year-old male well developed well nourished in no acute distress.  Alert and oriented x3. . Cardiovascular: Regular rate and rhythm with no rubs or gallops.  No thyromegaly or JVD noted.   Marland Kitchen Respiratory: Clear to auscultation with no wheezes or rales. Good inspiratory effort. . Abdomen: Soft nontender nondistended with normal bowel sounds x4 quadrants. . Musculoskeletal: No lower extremity edema. 2/4 pulses in all 4 extremities. . Skin: No ulcerative lesions noted or rashes, . Psychiatry: Mood is appropriate for condition and setting    Data Reviewed: CBC: Recent Labs  Lab 01/20/20 2136 01/21/20 1808 01/22/20 0533 01/23/20 0618  WBC 10.6* 9.3 12.8* 11.2*  NEUTROABS 9.3*  --   --   --   HGB 13.7 13.0 12.3* 11.0*  HCT 42.1 39.7 37.7* 33.5*  MCV 90.3 89.8 90.0 89.8  PLT 203 181 184 152   Basic Metabolic Panel: Recent Labs  Lab 01/20/20 2136 01/21/20 1808 01/22/20 0533 01/23/20 0618  NA 139  --  134* 135  K 4.4  --  3.8 3.8  CL 104  --  98 101  CO2 23  --  23 24  GLUCOSE 113*  --  139* 107*  BUN 24*  --  20 19  CREATININE 0.96 0.97 0.99 0.98  CALCIUM 9.3  --  8.6* 8.4*   GFR: Estimated Creatinine Clearance: 53.1 mL/min (by C-G formula based on SCr of 0.98 mg/dL). Liver Function Tests: No results for input(s): AST, ALT, ALKPHOS, BILITOT, PROT, ALBUMIN in the last 168 hours. No results for input(s): LIPASE, AMYLASE in the last 168 hours. No results for input(s): AMMONIA in the last 168 hours. Coagulation Profile: No results for input(s): INR, PROTIME in the last 168 hours. Cardiac Enzymes: No results for input(s): CKTOTAL, CKMB, CKMBINDEX, TROPONINI in the last 168 hours. BNP (last 3 results) No  results for input(s): PROBNP in the last 8760 hours. HbA1C: No results for input(s): HGBA1C in the last 72 hours. CBG: No results for input(s): GLUCAP in the last 168 hours. Lipid Profile: No results for input(s): CHOL, HDL, LDLCALC, TRIG, CHOLHDL, LDLDIRECT in the last 72 hours. Thyroid Function Tests: No results for input(s): TSH, T4TOTAL, FREET4, T3FREE, THYROIDAB in the last 72 hours. Anemia Panel: No results for input(s): VITAMINB12, FOLATE, FERRITIN, TIBC, IRON, RETICCTPCT in the last 72 hours. Urine analysis: No results found for: COLORURINE, APPEARANCEUR, LABSPEC, PHURINE, GLUCOSEU, HGBUR, BILIRUBINUR, KETONESUR, PROTEINUR, UROBILINOGEN, NITRITE, LEUKOCYTESUR Sepsis Labs: @LABRCNTIP (procalcitonin:4,lacticidven:4)  ) Recent Results (from the past 240 hour(s))  SARS CORONAVIRUS 2 (TAT 6-24 HRS) Nasopharyngeal Nasopharyngeal Swab     Status: None   Collection Time: 01/20/20 12:57 AM   Specimen: Nasopharyngeal Swab  Result Value Ref Range Status   SARS Coronavirus 2 NEGATIVE NEGATIVE Final    Comment: (NOTE) SARS-CoV-2  target nucleic acids are NOT DETECTED. The SARS-CoV-2 RNA is generally detectable in upper and lower respiratory specimens during the acute phase of infection. Negative results do not preclude SARS-CoV-2 infection, do not rule out co-infections with other pathogens, and should not be used as the sole basis for treatment or other patient management decisions. Negative results must be combined with clinical observations, patient history, and epidemiological information. The expected result is Negative. Fact Sheet for Patients: HairSlick.no Fact Sheet for Healthcare Providers: quierodirigir.com This test is not yet approved or cleared by the [place] FDA and  has been authorized for detection and/or diagnosis of SARS-CoV-2 by FDA under an Emergency Use Authorization (EUA). This EUA will remain  in effect  (meaning this test can be used) for the duration of the COVID-19 declaration under Section 56 4(b)(1) of the Act, 21 U.S.C. section 360bbb-3(b)(1), unless the authorization is terminated or revoked sooner. Performed at [facility], [address] [place], [place] [address]   Surgical PCR screen     Status: None   Collection Time: 01/21/20  5:06 AM   Specimen: Nasal Mucosa; Nasal Swab  Result Value Ref Range Status   MRSA, PCR NEGATIVE NEGATIVE Final   Staphylococcus aureus NEGATIVE NEGATIVE Final    Comment: (NOTE) The Xpert SA Assay (FDA approved for NASAL specimens in patients 28 years of age and older), is one component of a comprehensive surveillance program. It is not intended to diagnose infection nor to guide or monitor treatment. Performed at [facility], [address] [place], [place] [address]       Studies: No results found.  Scheduled Meds: . docusate sodium  100 mg Oral BID  . enoxaparin (LOVENOX) injection  40 mg Subcutaneous Q24H  . eye wash  1 drop Both Eyes BID  . mupirocin ointment  1 application Nasal BID  . senna  1 tablet Oral BID    Continuous Infusions: . sodium chloride 75 mL/hr at 01/23/20 1000  . lactated ringers 10 mL/hr at 01/21/20 1347  . lactated ringers    . methocarbamol (ROBAXIN) IV       LOS: 3 days     Myrtie Neither, MD [facility]  To reach me or the doctor on call, go to: [email or web address] Password [facility]  01/23/2020, 11:52 AM

## 2020-01-23 NOTE — Anesthesia Postprocedure Evaluation (Signed)
Anesthesia Post Note  Patient: Joshua Shannon  Procedure(s) Performed: ANTERIOR APPROACH HEMI HIP ARTHROPLASTY (Right Hip)     Patient location during evaluation: PACU Anesthesia Type: Spinal Level of consciousness: awake and alert Pain management: pain level controlled Vital Signs Assessment: post-procedure vital signs reviewed and stable Respiratory status: spontaneous breathing and respiratory function stable Cardiovascular status: blood pressure returned to baseline and stable Postop Assessment: spinal receding Anesthetic complications: no    Last Vitals:  Vitals:   01/23/20 1514 01/23/20 2010  BP: 138/65 128/62  Pulse: 88 86  Resp: 17   Temp: 36.4 C 37.4 C  SpO2: 94% 96%    Last Pain:  Vitals:   01/23/20 2010  TempSrc: Oral  PainSc:                  Jenavieve Freda DANIEL

## 2020-01-23 NOTE — Plan of Care (Signed)
  Problem: Activity: Goal: Ability to avoid complications of mobility impairment will improve Outcome: Progressing   Problem: Education: Goal: Verbalization of understanding the information provided will improve Outcome: Progressing   Problem: Respiratory: Goal: Ability to maintain a clear airway will improve Outcome: Progressing   Problem: Skin Integrity: Goal: Signs of wound healing will improve Outcome: Progressing

## 2020-01-24 ENCOUNTER — Encounter: Payer: Self-pay | Admitting: *Deleted

## 2020-01-24 ENCOUNTER — Inpatient Hospital Stay (HOSPITAL_COMMUNITY)
Admission: RE | Admit: 2020-01-24 | Discharge: 2020-02-11 | DRG: 560 | Disposition: A | Discharge status: Skilled Nursing Facility | Payer: Medicare Other | Source: Intra-hospital | Attending: Physical Medicine & Rehabilitation | Admitting: Physical Medicine & Rehabilitation

## 2020-01-24 ENCOUNTER — Other Ambulatory Visit: Payer: Self-pay

## 2020-01-24 DIAGNOSIS — Z20822 Contact with and (suspected) exposure to covid-19: Secondary | ICD-10-CM | POA: Diagnosis present

## 2020-01-24 DIAGNOSIS — Y92009 Unspecified place in unspecified non-institutional (private) residence as the place of occurrence of the external cause: Secondary | ICD-10-CM

## 2020-01-24 DIAGNOSIS — Z79899 Other long term (current) drug therapy: Secondary | ICD-10-CM

## 2020-01-24 DIAGNOSIS — I69322 Dysarthria following cerebral infarction: Secondary | ICD-10-CM

## 2020-01-24 DIAGNOSIS — D62 Acute posthemorrhagic anemia: Secondary | ICD-10-CM | POA: Diagnosis present

## 2020-01-24 DIAGNOSIS — W050XXD Fall from non-moving wheelchair, subsequent encounter: Secondary | ICD-10-CM | POA: Diagnosis present

## 2020-01-24 DIAGNOSIS — N400 Enlarged prostate without lower urinary tract symptoms: Secondary | ICD-10-CM | POA: Diagnosis present

## 2020-01-24 DIAGNOSIS — Z8659 Personal history of other mental and behavioral disorders: Secondary | ICD-10-CM

## 2020-01-24 DIAGNOSIS — K5900 Constipation, unspecified: Secondary | ICD-10-CM | POA: Diagnosis present

## 2020-01-24 DIAGNOSIS — Z79891 Long term (current) use of opiate analgesic: Secondary | ICD-10-CM

## 2020-01-24 DIAGNOSIS — E785 Hyperlipidemia, unspecified: Secondary | ICD-10-CM | POA: Diagnosis present

## 2020-01-24 DIAGNOSIS — R413 Other amnesia: Secondary | ICD-10-CM | POA: Diagnosis not present

## 2020-01-24 DIAGNOSIS — I69311 Memory deficit following cerebral infarction: Secondary | ICD-10-CM | POA: Diagnosis not present

## 2020-01-24 DIAGNOSIS — R400 Somnolence: Secondary | ICD-10-CM | POA: Diagnosis not present

## 2020-01-24 DIAGNOSIS — Z96649 Presence of unspecified artificial hip joint: Secondary | ICD-10-CM

## 2020-01-24 DIAGNOSIS — Z7982 Long term (current) use of aspirin: Secondary | ICD-10-CM | POA: Diagnosis not present

## 2020-01-24 DIAGNOSIS — S72001D Fracture of unspecified part of neck of right femur, subsequent encounter for closed fracture with routine healing: Secondary | ICD-10-CM | POA: Diagnosis not present

## 2020-01-24 DIAGNOSIS — I69398 Other sequelae of cerebral infarction: Secondary | ICD-10-CM

## 2020-01-24 DIAGNOSIS — F329 Major depressive disorder, single episode, unspecified: Secondary | ICD-10-CM | POA: Diagnosis present

## 2020-01-24 DIAGNOSIS — Z96641 Presence of right artificial hip joint: Secondary | ICD-10-CM | POA: Diagnosis present

## 2020-01-24 DIAGNOSIS — R252 Cramp and spasm: Secondary | ICD-10-CM | POA: Diagnosis present

## 2020-01-24 DIAGNOSIS — G4700 Insomnia, unspecified: Secondary | ICD-10-CM | POA: Diagnosis not present

## 2020-01-24 DIAGNOSIS — M7989 Other specified soft tissue disorders: Secondary | ICD-10-CM | POA: Diagnosis not present

## 2020-01-24 DIAGNOSIS — S72091D Other fracture of head and neck of right femur, subsequent encounter for closed fracture with routine healing: Principal | ICD-10-CM

## 2020-01-24 DIAGNOSIS — I69351 Hemiplegia and hemiparesis following cerebral infarction affecting right dominant side: Secondary | ICD-10-CM | POA: Diagnosis not present

## 2020-01-24 DIAGNOSIS — R410 Disorientation, unspecified: Secondary | ICD-10-CM | POA: Diagnosis not present

## 2020-01-24 DIAGNOSIS — W19XXXA Unspecified fall, initial encounter: Secondary | ICD-10-CM

## 2020-01-24 DIAGNOSIS — S72001S Fracture of unspecified part of neck of right femur, sequela: Secondary | ICD-10-CM

## 2020-01-24 DIAGNOSIS — S72001A Fracture of unspecified part of neck of right femur, initial encounter for closed fracture: Secondary | ICD-10-CM | POA: Diagnosis not present

## 2020-01-24 DIAGNOSIS — S7291XA Unspecified fracture of right femur, initial encounter for closed fracture: Secondary | ICD-10-CM | POA: Diagnosis present

## 2020-01-24 DIAGNOSIS — G8111 Spastic hemiplegia affecting right dominant side: Secondary | ICD-10-CM

## 2020-01-24 LAB — CBC
HCT: 32.3 % — ABNORMAL LOW (ref 39.0–52.0)
Hemoglobin: 10.7 g/dL — ABNORMAL LOW (ref 13.0–17.0)
MCH: 29.3 pg (ref 26.0–34.0)
MCHC: 33.1 g/dL (ref 30.0–36.0)
MCV: 88.5 fL (ref 80.0–100.0)
Platelets: 158 10*3/uL (ref 150–400)
RBC: 3.65 MIL/uL — ABNORMAL LOW (ref 4.22–5.81)
RDW: 12.8 % (ref 11.5–15.5)
WBC: 8.9 10*3/uL (ref 4.0–10.5)
nRBC: 0 % (ref 0.0–0.2)

## 2020-01-24 MED ORDER — ALUM & MAG HYDROXIDE-SIMETH 200-200-20 MG/5ML PO SUSP: 30.0000 mL | ORAL | 0 refills | Status: DC | PRN

## 2020-01-24 MED ORDER — ENOXAPARIN SODIUM 40 MG/0.4ML ~~LOC~~ SOLN: 40.0000 mg | SUBCUTANEOUS | Status: DC

## 2020-01-24 MED ORDER — METHOCARBAMOL 500 MG PO TABS: 500.0000 mg | ORAL_TABLET | Freq: Four times a day (QID) | ORAL | Status: DC | PRN

## 2020-01-24 MED ORDER — SORBITOL 70 % SOLN: 30.0000 mL | Freq: Every day | Status: DC | PRN

## 2020-01-24 MED ORDER — MAGNESIUM OXIDE 400 (241.3 MG) MG PO TABS
400.0000 mg | ORAL_TABLET | Freq: Every day | ORAL | Status: DC
  Administered 2020-01-24 – 2020-02-11 (×19): 400 mg via ORAL
  Filled 2020-01-24 (×18): qty 1

## 2020-01-24 MED ORDER — POLYETHYLENE GLYCOL 3350 17 G PO PACK
17.0000 g | PACK | Freq: Every day | ORAL | Status: DC
  Administered 2020-01-24: 17 g via ORAL
  Filled 2020-01-24: qty 1

## 2020-01-24 MED ORDER — ATORVASTATIN CALCIUM 40 MG PO TABS
40.0000 mg | ORAL_TABLET | Freq: Every day | ORAL | Status: DC
  Administered 2020-01-24 – 2020-02-10 (×18): 40 mg via ORAL
  Filled 2020-01-24 (×18): qty 1

## 2020-01-24 MED ORDER — BACLOFEN 10 MG PO TABS: 20.0000 mg | ORAL_TABLET | Freq: Every day | ORAL | Status: DC

## 2020-01-24 MED ORDER — ENOXAPARIN SODIUM 40 MG/0.4ML ~~LOC~~ SOLN
40.0000 mg | SUBCUTANEOUS | Status: DC
  Administered 2020-01-25 – 2020-02-11 (×18): 40 mg via SUBCUTANEOUS
  Filled 2020-01-24 (×17): qty 0.4

## 2020-01-24 MED ORDER — BACLOFEN 10 MG PO TABS
10.0000 mg | ORAL_TABLET | Freq: Every morning | ORAL | Status: DC
  Filled 2020-01-24: qty 1

## 2020-01-24 MED ORDER — FLUOXETINE HCL 20 MG PO CAPS
40.0000 mg | ORAL_CAPSULE | Freq: Every day | ORAL | Status: DC
  Administered 2020-01-24: 40 mg via ORAL
  Filled 2020-01-24: qty 2

## 2020-01-24 MED ORDER — BACLOFEN 10 MG PO TABS: 10.0000 mg | ORAL_TABLET | ORAL | Status: DC

## 2020-01-24 MED ORDER — SORBITOL 70 % SOLN
30.0000 mL | Freq: Every day | Status: DC | PRN
  Administered 2020-02-07: 08:00:00 30 mL via ORAL
  Filled 2020-01-24: qty 30

## 2020-01-24 MED ORDER — ASPIRIN 325 MG PO TABS
325.0000 mg | ORAL_TABLET | Freq: Every day | ORAL | Status: DC
  Administered 2020-01-24 – 2020-02-11 (×19): 325 mg via ORAL
  Filled 2020-01-24 (×19): qty 1

## 2020-01-24 MED ORDER — SENNOSIDES-DOCUSATE SODIUM 8.6-50 MG PO TABS: 2.0000 | ORAL_TABLET | Freq: Every day | ORAL | Status: DC

## 2020-01-24 MED ORDER — TAMSULOSIN HCL 0.4 MG PO CAPS
0.4000 mg | ORAL_CAPSULE | Freq: Every day | ORAL | Status: DC
  Administered 2020-01-24 – 2020-02-11 (×19): 0.4 mg via ORAL
  Filled 2020-01-24 (×19): qty 1

## 2020-01-24 MED ORDER — EYE WASH OPHTH SOLN
1.0000 [drp] | Freq: Two times a day (BID) | OPHTHALMIC | Status: DC
  Administered 2020-01-24 – 2020-02-11 (×36): 1 [drp] via OPHTHALMIC
  Filled 2020-01-24: qty 118

## 2020-01-24 MED ORDER — ONDANSETRON HCL 4 MG/2ML IJ SOLN: 4.0000 mg | Freq: Four times a day (QID) | INTRAMUSCULAR | Status: DC | PRN

## 2020-01-24 MED ORDER — FLUOXETINE HCL 20 MG PO CAPS
40.0000 mg | ORAL_CAPSULE | Freq: Every day | ORAL | Status: DC
  Administered 2020-01-25 – 2020-02-11 (×18): 40 mg via ORAL
  Filled 2020-01-24 (×18): qty 2

## 2020-01-24 MED ORDER — DOCUSATE SODIUM 100 MG PO CAPS: 100.0000 mg | ORAL_CAPSULE | Freq: Two times a day (BID) | ORAL | 0 refills | Status: AC

## 2020-01-24 MED ORDER — BACLOFEN 10 MG PO TABS
10.0000 mg | ORAL_TABLET | Freq: Every day | ORAL | Status: DC
  Administered 2020-01-25 – 2020-01-26 (×2): 10 mg via ORAL
  Filled 2020-01-24 (×2): qty 1

## 2020-01-24 MED ORDER — METHOCARBAMOL 1000 MG/10ML IJ SOLN: 500.0000 mg | Freq: Four times a day (QID) | INTRAVENOUS | Status: DC | PRN

## 2020-01-24 MED ORDER — ONDANSETRON HCL 4 MG PO TABS
4.0000 mg | ORAL_TABLET | Freq: Four times a day (QID) | ORAL | Status: DC | PRN
  Filled 2020-01-24: qty 1

## 2020-01-24 MED ORDER — POLYETHYLENE GLYCOL 3350 17 G PO PACK: 17.0000 g | PACK | Freq: Every day | ORAL | Status: DC | PRN

## 2020-01-24 MED ORDER — ACETAMINOPHEN 325 MG PO TABS
325.0000 mg | ORAL_TABLET | Freq: Four times a day (QID) | ORAL | Status: DC | PRN
  Administered 2020-01-25 – 2020-01-30 (×12): 650 mg via ORAL
  Filled 2020-01-24 (×12): qty 2

## 2020-01-24 MED ORDER — MAGNESIUM CITRATE PO SOLN: 1.0000 | Freq: Once | ORAL | Status: DC | PRN

## 2020-01-24 MED ORDER — TAMSULOSIN HCL 0.4 MG PO CAPS: 0.4000 mg | ORAL_CAPSULE | Freq: Every day | ORAL | Status: DC

## 2020-01-24 MED ORDER — TRAMADOL HCL 50 MG PO TABS: 50.0000 mg | ORAL_TABLET | Freq: Two times a day (BID) | ORAL | Status: DC | PRN

## 2020-01-24 MED ORDER — BACLOFEN 20 MG PO TABS
20.0000 mg | ORAL_TABLET | Freq: Every day | ORAL | Status: DC
  Administered 2020-01-24 – 2020-01-30 (×7): 20 mg via ORAL
  Filled 2020-01-24 (×7): qty 1

## 2020-01-24 NOTE — Progress Notes (Signed)
Occupational Therapy Treatment Patient Details Name: Joshua Shannon MRN: [id number] DOB: 25-Mar-1941 Today's Date: 01/24/2020    History of present illness Pt is a 79 yo male admitted after falling out of w/c resulting in R femur fx requiring R anterior hip hemiarthroplasty. WBAT RLE. 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   OT comments  Pt progressing with bed mobility and transfer to maxA. Still good to consider +2 for safety and progression of mobility. Pt minA for anterior pericare. Pt able to during transfer in standing with minimal wt bear on RLE. Pt with residual R side weakness making pt difficult to perform ADL and mobility with less assist. Pt continues to be maxA for sit to stand from recliner and poor stability and unwilling to take LUE off of recliner. Pt would greatly benefit from continued OT skilled services. OT following acutely.    Follow Up Recommendations  CIR;Supervision/Assistance - 24 hour    Equipment Recommendations  Other (comment)(to be determined)    Recommendations for Other Services      Precautions / Restrictions Precautions Precautions: Fall Precaution Comments: residual R sided weakness from previous CVA in May of 2020 Restrictions Weight Bearing Restrictions: Yes RLE Weight Bearing: Weight bearing as tolerated       Mobility Bed Mobility Overal bed mobility: Needs Assistance Bed Mobility: Supine to Sit Rolling: Max assist   Supine to sit: Max assist;HOB elevated     General bed mobility comments: Heavy assist to EOB with HOB elevated and bed pad used  Transfers Overall transfer level: Needs assistance Equipment used: 1 person hand held assist Transfers: Sit to/from [employer] Sit to Stand: Max assist;From elevated surface Stand pivot transfers: Max assist;Total assist;From elevated surface       General transfer comment: Pt able to assist minimally for sit to stand and does not appear to assist with RLE.      Balance Overall balance assessment: Needs assistance Sitting-balance support: Bilateral upper extremity supported;Feet supported Sitting balance-Leahy Scale: Poor     Standing balance support: Bilateral upper extremity supported Standing balance-Leahy Scale: Poor Standing balance comment: maxA for stability in standing                           ADL either performed or assessed with clinical judgement   ADL Overall ADL's : Needs assistance/impaired Eating/Feeding: Set up;Sitting Eating/Feeding Details (indicate cue type and reason): After food cut up, pt able to spoon feed self                         Toileting- Clothing Manipulation and Hygiene: Minimal assistance;Sitting/lateral lean Toileting - Clothing Manipulation Details (indicate cue type and reason): pt performing in sitting with urinal and wash cloths for pericare     Functional mobility during ADLs: Maximal assistance;Cueing for safety;Cueing for sequencing General ADL Comments: Pt limited by increased pain, decreased mobility and decreased ability to care for self safely.     Vision       Perception     Praxis      Cognition Arousal/Alertness: Awake/alert Behavior During Therapy: WFL for tasks assessed/performed Overall Cognitive Status: Within Functional Limits for tasks assessed                                 General Comments: pt sweating and reporting "the sedative makes me  feel that way."        Exercises     Shoulder Instructions       General Comments Pt's room temperature cooled down and pt's VSS    Pertinent Vitals/ Pain       Pain Assessment: Faces Faces Pain Scale: Hurts little more Pain Location: R hip pain Pain Descriptors / Indicators: Discomfort;Guarding Pain Intervention(s): Monitored during session;Limited activity within patient's tolerance  Home Living                                          Prior  Functioning/Environment              Frequency  Min 2X/week        Progress Toward Goals  OT Goals(current goals can now be found in the care plan section)  Progress towards OT goals: Progressing toward goals  Acute Rehab OT Goals Patient Stated Goal: to go to rehab OT Goal Formulation: With patient Time For Goal Achievement: 02/05/20 Potential to Achieve Goals: Good ADL Goals Pt Will Perform Grooming: with set-up;sitting Pt Will Perform Lower Body Dressing: with max assist;sitting/lateral leans;sit to/from stand;bed level Pt Will Transfer to Toilet: with mod assist;stand pivot transfer;bedside commode Pt Will Perform Toileting - Clothing Manipulation and hygiene: with max assist;sitting/lateral leans;sit to/from stand Pt/caregiver will Perform Home Exercise Program: Increased strength;Both right and left upper extremity;With minimal assist  Plan Discharge plan remains appropriate    Co-evaluation                 AM-PAC OT "6 Clicks" Daily Activity     Outcome Measure   Help from another person eating meals?: A Little Help from another person taking care of personal grooming?: A Little Help from another person toileting, which includes using toliet, bedpan, or urinal?: Total Help from another person bathing (including washing, rinsing, drying)?: A Lot Help from another person to put on and taking off regular upper body clothing?: A Lot Help from another person to put on and taking off regular lower body clothing?: Total 6 Click Score: 12    End of Session Equipment Utilized During Treatment: Gait belt  OT Visit Diagnosis: Unsteadiness on feet (R26.81);Muscle weakness (generalized) (M62.81);Pain Pain - Right/Left: Right Pain - part of body: Hip   Activity Tolerance Patient limited by pain   Patient Left in chair;with call bell/phone within reach;with bed alarm set   Nurse Communication Mobility status;Need for lift equipment(may need stedy for staff  transfer)        Time: 4010-2725 OT Time Calculation (min): 30 min  Charges: OT General Charges $OT Visit: 1 Visit OT Treatments $Self Care/Home Management : 8-22 mins $Therapeutic Activity: 8-22 mins  Jefferey Pica OTR/L Acute Rehabilitation Services Pager: [phone] Office: [phone]    EPPIRJJ C 01/24/2020, 10:19 AM

## 2020-01-24 NOTE — Progress Notes (Signed)
Meredith Staggers, MD  Physician  Physical Medicine and Rehabilitation  PMR Pre-admission  Signed  Date of Service:  01/24/2020  1:41 PM      Related encounter: ED to Hosp-Admission (Current) from 01/20/2020 in [place]        Show:Clear all '[x]' Manual'[x]' Template'[x]' Copied  Added by: '[x]' Cristina Gong, RN'[x]' Meredith Staggers, MD  '[]' Hover for details PMR Admission Coordinator Pre-Admission Assessment   Patient: Joshua Shannon is an 79 y.o., male MRN: [id number] DOB: 1941-07-09 Height: '5\' 7"'  (170.2 cm) Weight: 58.5 kg   Insurance Information HMO:     PPO:      PCP:      IPA:      80/20:     OTHER:  PRIMARY: Medicare a and b      Policy#: [id number]      Subscriber: pt Benefits:  Phone #: passport one     Name: 2/1 Eff. Date: a 03/23/2008 b 08/23/2012     Deduct: $1484      Out of Pocket Max: none      Life Max: none CIR: 100%      SNF: 20 full days Outpatient: 80%     Co-Pay: 20% Home Health: 100%      Co-Pay: none DME: 805     Co-Pay: 205 Providers: pt choice  SECONDARY: Roseanna Rainbow      Policy#: [id number]      Subscriber: pt   Medicaid Application Date:       Case Manager:  Disability Application Date:       Case Worker:    The "Data Collection Information Summary" for patients in Inpatient Rehabilitation Facilities with attached "Privacy Act [place] Records" was provided and verbally reviewed with: Family   Emergency Contact Information         Contact Information     Name Relation Home Work Mobile    Bernet,Phyllis Spouse [phone]   [phone]    Lavel, Rieman Daughter     [phone]         Current Medical History  Patient Admitting Diagnosis: femoral neck fracture   History of Present Illness:79 year old right-handed male with history of hyperlipidemia, depression, BPH, 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  Patient currently resides at [facility] assisted living facility.  Presented 01/20/2020 after mechanical fall without loss of consciousness.  Patient states he fell out of his wheelchair with right hip pain.  X-rays and imaging revealed right femoral neck fracture.  Underwent right hip hemiarthroplasty anterior approach 01/21/2020 per Dr.Xu.  Weightbearing as tolerated right lower extremity.  Maintained on Lovenox for DVT prophylaxis.  Acute blood loss anemia 10.7 and monitored.      Patient's medical record from [facility] has been reviewed by the rehabilitation admission coordinator and physician.   Past Medical History      Past Medical History:  Diagnosis Date  . Depression    . Hemorrhoids    . Hyperlipemia        Family History   family history is not on file.   Prior Rehab/Hospitalizations Has the patient had prior rehab or hospitalizations prior to admission? Yes   Was at home with wife prior to 04/2019. [facility] 04[facility] after CVA, went to [facility] SNF and then to ALF 06/2019 where he remained until admit.   Has the patient  had major surgery during 100 days prior to admission? Yes              Current Medications   Current Facility-Administered Medications:  .  0.9 %  sodium chloride infusion, , Intravenous, Continuous, Leandrew Koyanagi, MD, Last Rate: 75 mL/hr at 01/23/20 1819, Rate Verify at 01/23/20 1819 .  acetaminophen (TYLENOL) tablet 325-650 mg, 325-650 mg, Oral, Q6H PRN, Leandrew Koyanagi, MD, 650 mg at 01/23/20 1734 .  alum & mag hydroxide-simeth (MAALOX/MYLANTA) 200-200-20 MG/5ML suspension 30 mL, 30 mL, Oral, Q4H PRN, Leandrew Koyanagi, MD .  baclofen (LIORESAL) tablet 10 mg, 10 mg, Oral, q morning - 10a, Rizwan, Saima, MD .  baclofen (LIORESAL) tablet 20 mg, 20 mg, Oral, QHS, Rizwan, Saima, MD .  docusate sodium (COLACE) capsule 100 mg, 100 mg, Oral, BID, Leandrew Koyanagi, MD, 100 mg at 01/24/20 1103 .  enoxaparin (LOVENOX) injection 40 mg, 40 mg,  Subcutaneous, Q24H, Leandrew Koyanagi, MD, 40 mg at 01/24/20 1103 .  eye wash ((SODIUM/POTASSIUM/SOD CHLORIDE)) ophthalmic solution 1 drop, 1 drop, Both Eyes, BID, Cristal Deer, MD, 1 drop at 01/24/20 1105 .  FLUoxetine (PROZAC) capsule 40 mg, 40 mg, Oral, Daily, Rizwan, Saima, MD .  lactated ringers infusion, , Intravenous, Continuous, Leandrew Koyanagi, MD, Last Rate: 10 mL/hr at 01/21/20 1347, Continued from Pre-op at 01/21/20 1347 .  lactated ringers infusion, , Intravenous, Continuous, Ellender, Karyl Kinnier, MD .  magnesium citrate solution 1 Bottle, 1 Bottle, Oral, Once PRN, Leandrew Koyanagi, MD .  menthol-cetylpyridinium (CEPACOL) lozenge 3 mg, 1 lozenge, Oral, PRN, 3 mg at 01/23/20 0537 **OR** phenol (CHLORASEPTIC) mouth spray 1 spray, 1 spray, Mouth/Throat, PRN, Leandrew Koyanagi, MD .  methocarbamol (ROBAXIN) tablet 500 mg, 500 mg, Oral, Q6H PRN, 500 mg at 01/22/20 2115 **OR** methocarbamol (ROBAXIN) 500 mg in dextrose 5 % 50 mL IVPB, 500 mg, Intravenous, Q6H PRN, Leandrew Koyanagi, MD .  mupirocin ointment (BACTROBAN) 2 % 1 application, 1 application, Nasal, BID, Leandrew Koyanagi, MD, 1 application at 94/50/38 1105 .  ondansetron (ZOFRAN) tablet 4 mg, 4 mg, Oral, Q6H PRN **OR** ondansetron (ZOFRAN) injection 4 mg, 4 mg, Intravenous, Q6H PRN, Leandrew Koyanagi, MD, 4 mg at 01/23/20 2019 .  polyethylene glycol (MIRALAX / GLYCOLAX) packet 17 g, 17 g, Oral, Daily PRN, Leandrew Koyanagi, MD, 17 g at 01/22/20 1955 .  polyethylene glycol (MIRALAX / GLYCOLAX) packet 17 g, 17 g, Oral, Daily, Rizwan, Saima, MD .  senna-docusate (Senokot-S) tablet 2 tablet, 2 tablet, Oral, Daily, Rizwan, Saima, MD .  sorbitol 70 % solution 30 mL, 30 mL, Oral, Daily PRN, Leandrew Koyanagi, MD .  Derrill Memo ON 01/25/2020] tamsulosin (FLOMAX) capsule 0.4 mg, 0.4 mg, Oral, QPC breakfast, Rizwan, Saima, MD   Patients Current Diet:     Diet Order                      Diet - low sodium heart healthy           Diet regular Room service appropriate? Yes;  Fluid consistency: Thin  Diet effective now                   Precautions / Restrictions Precautions Precautions: Fall Precaution Comments: residual R sided weakness from previous CVA in May of 2020 Restrictions Weight Bearing Restrictions: Yes RLE Weight Bearing: Weight bearing as tolerated Other Position/Activity Restrictions: WBAT RLE    Has the patient had 2  or more falls or a fall with injury in the past year? Yes   Prior Activity Level Limited Community (1-2x/wk): ALF since 06/2019. Not living at home since 04/2019   Prior Functional Level Self Care: Did the patient need help bathing, dressing, using the toilet or eating? Needed some help   Indoor Mobility: Did the patient need assistance with walking from room to room (with or without device)? Needed some help   Stairs: Did the patient need assistance with internal or external stairs (with or without device)? Needed some help   Functional Cognition: Did the patient need help planning regular tasks such as shopping or remembering to take medications? Needed some help   Home Assistive Devices / [place] Devices/Equipment: Kasandra Knudsen (specify quad or straight), Wheelchair Home Equipment: Wheelchair - manual   Prior Device Use: Indicate devices/aids used by the patient prior to current illness, exacerbation or injury? Manual wheelchair and Walker   Current Functional Level Cognition   Overall Cognitive Status: Within Functional Limits for tasks assessed Orientation Level: Oriented to person General Comments: pt sweating and reporting "the sedative makes me feel that way."    Extremity Assessment (includes Sensation/Coordination)   Upper Extremity Assessment: Generalized weakness RUE Deficits / Details: residual deficits from CVA; hand flexed and elbow with rigid flexor tone LUE Deficits / Details: 3+/5 MM grade overall  Lower Extremity Assessment: Defer to PT evaluation RLE Deficits / Details: pt with  decreased strength and ROM limitations secondary to post-op pain and weakness; pt also with R sided weakness from previous CVA     ADLs   Overall ADL's : Needs assistance/impaired Eating/Feeding: Set up, Sitting Eating/Feeding Details (indicate cue type and reason): After food cut up, pt able to spoon feed self Grooming: Set up, Sitting Upper Body Bathing: Moderate assistance, Sitting Lower Body Bathing: Maximal assistance, Total assistance, +2 for physical assistance, Cueing for safety, Cueing for sequencing, Sitting/lateral leans, Sit to/from stand Upper Body Dressing : Maximal assistance, Total assistance, Cueing for safety, Sitting, Standing Lower Body Dressing: Maximal assistance, Total assistance, +2 for physical assistance, Cueing for safety, Cueing for sequencing, Sitting/lateral leans, Sit to/from stand Toilet Transfer: Maximal assistance, +2 for physical assistance, Squat-pivot, BSC Toileting- Clothing Manipulation and Hygiene: Minimal assistance, Sitting/lateral lean Toileting - Clothing Manipulation Details (indicate cue type and reason): pt performing in sitting with urinal and wash cloths for pericare Functional mobility during ADLs: Maximal assistance, Cueing for safety, Cueing for sequencing General ADL Comments: Pt limited by increased pain, decreased mobility and decreased ability to care for self safely.     Mobility   Overal bed mobility: Needs Assistance Bed Mobility: Supine to Sit Rolling: Max assist Supine to sit: Max assist, HOB elevated General bed mobility comments: Heavy assist to EOB with HOB elevated and bed pad used     Transfers   Overall transfer level: Needs assistance Equipment used: 1 person hand held assist Transfers: Sit to/from Stand, Stand Pivot Transfers Sit to Stand: Max assist, From elevated surface Stand pivot transfers: Max assist, Total assist, From elevated surface General transfer comment: Pt able to assist minimally for sit to stand and  does not appear to assist with RLE.      Ambulation / Gait / Stairs / Proofreader / Balance Dynamic Sitting Balance Sitting balance - Comments: Intermittent assist required Balance Overall balance assessment: Needs assistance Sitting-balance support: Bilateral upper extremity supported, Feet supported Sitting balance-Leahy Scale: Poor Sitting balance -  Comments: Intermittent assist required Standing balance support: Bilateral upper extremity supported Standing balance-Leahy Scale: Poor Standing balance comment: maxA for stability in standing     Special needs/care consideration BiPAP/CPAP  CPM  Continuous Drip IV  Dialysis          Life Vest  Oxygen  Special Bed  Trach Size  Wound Vac  Skin surgical incision Bowel mgmt: incontinent Bladder mgmt: external catheter Diabetic mgmt:  Behavioral consideration  Chemo/radiation  Designated visitor is wife, Silva Bandy Confusion new this admit    Previous Home Environment  Living Arrangements: Other (Comment)([facility] since 06/2019)  Lives With: Other (Comment) Available Help at Discharge: Other (Comment)([facility] at d/c is goal) Type of Home: Assisted living Home Layout: One level Bathroom Shower/Tub: Chiropodist: Standard Bathroom Accessibility: Yes How Accessible: Accessible via wheelchair, Accessible via walker [place]: No Additional Comments: [facility]F since 06/2019   Discharge Living Setting Plans for Discharge Living Setting: ([facility]) Type of Home at Discharge: [place] Name at Discharge: [place] Discharge Home Layout: One level Discharge Home Access: Level entry Discharge Bathroom Shower/Tub: Tub/shower unit Discharge Bathroom Toilet: Standard Discharge [place] Accessibility: Yes How Accessible: Accessible via wheelchair, Accessible via walker Does the patient have any problems obtaining your medications?: No     Social/Family/Support Systems Patient Roles: Spouse, Parent(daughter, Mardene Celeste was OT at [facility] in the past. Lives in [place]) Contact Information: wife, Silva Bandy and daughter, Mardene Celeste Anticipated Caregiver: ALF staff Anticipated Caregiver's Contact Information: see above Caregiver Availability: 24/7 Discharge Plan Discussed with Primary Caregiver: Yes Is Caregiver In Agreement with Plan?: Yes Does Caregiver/Family have Issues with Lodging/Transportation while Pt is in Rehab?: No   Goals/Additional Needs Patient/Family Goal for Rehab: superivison to min PT and OT, supervision SLP Expected length of stay: [facility] 2 to 3 weeks Special Service Needs: COnfusion is new this admission Pt/Family Agrees to Admission and willing to participate: Yes Program Orientation Provided & Reviewed with Pt/Caregiver Including Roles  & Responsibilities: Yes   Decrease burden of Care through IP rehab admission:    Possible need for SNF placement upon discharge: goal is to return to ALF at [facility] at d/c, but wife and daughter aware that if he does not make to level needed, may need to go to [facility] SNF prior to return to ALF   Patient Condition: I have reviewed medical records from [facility] , spoken with CSW, and patient, spouse and daughter. I met with patient at the bedside for inpatient rehabilitation assessment.  Patient will benefit from ongoing PT, OT and SLP, can actively participate in 3 hours of therapy a day 5 days of the week, and can make measurable gains during the admission.  Patient will also benefit from the coordinated team approach during an Inpatient Acute Rehabilitation admission.  The patient will receive intensive therapy as well as Rehabilitation physician, nursing, social worker, and care management interventions.  Due to bladder management, bowel management, safety, skin/wound care, disease management, medication administration, pain management and patient education the patient requires 24  hour a day rehabilitation nursing.  The patient is currently mod assist with mobility and basic ADLs.  Discharge setting and therapy post discharge at assisted living facility is anticipated.  Patient has agreed to participate in the Acute Inpatient Rehabilitation Program and will admit today.   Preadmission Screen Completed By:  Cleatrice Burke, 01/24/2020 1:41 PM ______________________________________________________________________   Discussed status with Dr. Naaman Plummer  on  01/24/2020 at 1348  and received approval for admission today.   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 Does the need for close, 24 hr/day Medical supervision in concert with the patient's rehab needs make it unreasonable for this patient to be served in a less intensive setting? Yes 2. Co-Morbidities requiring supervision/potential complications: depression, spastic right hemiparesis 3. Due to bladder management, bowel management, safety, skin/wound care, disease management, medication administration, pain management and patient education, does the patient require 24 hr/day rehab nursing? Yes 4. Does the patient require coordinated care of a physician, rehab nurse, PT, OT, and SLP to address physical and functional deficits in the context of the above medical diagnosis(es)? Yes Addressing deficits in the following areas: balance, endurance, locomotion, strength, transferring, bowel/bladder control, bathing, dressing, feeding, grooming, toileting, cognition and psychosocial support 5. Can the patient actively participate in an intensive therapy program of at least 3 hrs of therapy 5 days a week? Yes 6. The potential for patient to make measurable gains while on inpatient rehab is excellent 7. Anticipated functional outcomes upon discharge from inpatient rehab: supervision and min assist PT, supervision and min assist OT,  modified independent and supervision SLP 8. Estimated rehab length of stay to reach the above functional goals is: 14-20 days 9. Anticipated discharge destination: Home 10. Overall Rehab/Functional Prognosis: excellent     MD Signature: Meredith Staggers, MD, [place] [facility] 01/24/2020         Revision History

## 2020-01-24 NOTE — Discharge Summary (Signed)
Physician Discharge Summary  [name] MWU:[id number] DOB: 03/04/1941 DOA: 01/20/2020  PCP: Kaleen Mask, MD  Admit date: 01/20/2020 Discharge date: 01/24/2020  Admitted From: ALF Disposition:  CIR   Recommendations for Outpatient Follow-up:  1. F/u on mental status  Discharge Condition:  stable   CODE STATUS:  Full code   Diet recommendation:  Heart healthy Consultations:  ortho    Discharge Diagnoses:  Principal Problem:   Closed displaced fracture of right femoral neck (HCC) Active Problems:   Fall at home, initial encounter   H/o CVA       Brief Summary: Joshua Shannon a 78 y.o.malewith medical history significant ofhyperlipidemia, depression, CVA, hemorrhoids presenting to the ED for evaluation of right hip pain after a mechanical fall. Patient states he fell out of his wheelchair and since then ishaving pain in his right hip region.   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  Constipation - resume Miralax and Senna    Discharge Exam: Vitals:   01/24/20 0444 01/24/20 0741  BP: (!) 147/86 (!) 171/90  Pulse: 95 98  Resp: 18 17  Temp: 98.6 F (37 C) 98.3 F (36.8 C)  SpO2: 99% 96%   Vitals:   01/23/20 1514 01/23/20 2010 01/24/20 0444 01/24/20 0741  BP: 138/65 128/62 (!) 147/86 (!) 171/90  Pulse: 88 86 95 98  Resp: 17 18 18 17   Temp: 97.6 F (36.4 C) 99.3 F (37.4 C) 98.6 F (37 C) 98.3 F (36.8  C)  TempSrc: Oral Oral Oral Oral  SpO2: 94% 96% 99% 96%  Weight:      Height:        General: Pt is alert, awake, not in acute distress Cardiovascular: RRR, S1/S2 +, no rubs, no gallops Respiratory: CTA bilaterally, no wheezing, no rhonchi Abdominal: Soft, NT, ND, bowel sounds + Extremities: no edema, no cyanosis   Discharge Instructions  Discharge Instructions    Diet - low sodium heart healthy   Complete by: As directed    Increase activity slowly   Complete by: As directed    Weight bearing as tolerated   Complete by: As directed      Allergies as of 01/24/2020   No Known Allergies     Medication List    STOP taking these medications   aspirin 325 MG EC tablet     TAKE these medications   acetaminophen 325 MG tablet Commonly known as: TYLENOL Take 650 mg by mouth every 6 (six) hours as needed for mild pain or moderate pain.   alum & mag hydroxide-simeth 200-200-20 MG/5ML suspension Commonly known as: MAALOX/MYLANTA Take 30 mLs by mouth every 4 (four) hours as needed for indigestion.   atorvastatin 40 MG tablet Commonly known as: LIPITOR Take 40 mg by mouth every evening.   baclofen 10 MG tablet Commonly known as: LIORESAL Take 10-20 mg by mouth See admin instructions. 10  mg in the morning, 20 mg at bedtime   calcium carbonate 500 MG chewable tablet Commonly known as: TUMS - dosed in mg elemental calcium Chew 2 tablets by mouth every 4 (four) hours as needed for indigestion or heartburn.   cholecalciferol 25 MCG (1000 UNIT) tablet Commonly known as: VITAMIN D3 Take 1,000 Units by mouth daily.   docusate sodium 100 MG capsule Commonly known as: COLACE Take 1 capsule (100 mg total) by mouth 2 (two) times daily.   enoxaparin 40 MG/0.4ML injection Commonly known as: LOVENOX Inject 0.4 mLs (40 mg total) into the skin daily.   FLUoxetine 40 MG capsule Commonly known as: PROZAC Take 40 mg by mouth daily.   gabapentin 100 MG capsule Commonly known as:  NEURONTIN Take 100 mg by mouth at bedtime.   magnesium citrate Soln Take 296 mLs (1 Bottle total) by mouth once as needed for severe constipation.   magnesium oxide 400 MG tablet Commonly known as: MAG-OX Take 400 mg by mouth at bedtime.   methocarbamol 500 MG tablet Commonly known as: ROBAXIN Take 1 tablet (500 mg total) by mouth every 6 (six) hours as needed for muscle spasms.   polyethylene glycol 17 g packet Commonly known as: MIRALAX / GLYCOLAX Take 17 g by mouth daily.   Refresh Tears 0.5 % Soln Generic drug: carboxymethylcellulose Place 1 drop into both eyes 3 (three) times daily as needed (Dry eyes, may keep at bedside).   sennosides-docusate sodium 8.6-50 MG tablet Commonly known as: SENOKOT-S Take 2 tablets by mouth daily.   tamsulosin 0.4 MG Caps capsule Commonly known as: FLOMAX Take 0.4 mg by mouth daily after breakfast.   traZODone 50 MG tablet Commonly known as: DESYREL Take 25 mg by mouth 3 (three) times daily as needed (anxiety).            Discharge Care Instructions  (From admission, onward)         Start     Ordered   01/21/20 0000  Weight bearing as tolerated     01/21/20 1343         Follow-up Information    Leandrew Koyanagi, MD In 2 weeks.   Specialty: Orthopedic Surgery Why: For suture removal, For wound re-check Contact information: [address] [place] [id number] [phone]          No Known Allergies   Procedures/Studies: Prosthetic replacement for femoral neck fracture    DG Chest 1 View  Result Date: 01/20/2020 CLINICAL DATA:  Right hip fracture EXAM: CHEST  1 VIEW COMPARISON:  None. FINDINGS: The heart size and mediastinal contours are within normal limits. Aortic atherosclerosis. Both lungs are clear. The visualized skeletal structures are unremarkable. IMPRESSION: No active disease. Electronically Signed   By: Donavan Foil M.D.   On: 01/20/2020 20:26   DG Pelvis 1-2 Views  Result Date:  01/20/2020 CLINICAL DATA:  [place] onto right hip EXAM: PELVIS - 1-2 VIEW COMPARISON:  None. FINDINGS: Metallic fragment or hardware over the upper sacrum. Pubic symphysis and rami appear intact. No fracture or malalignment left hip. Acute mildly displaced right femoral neck fracture. IMPRESSION: Acute right femoral neck fracture Electronically Signed   By: Donavan Foil M.D.   On: 01/20/2020 20:25   Pelvis Portable  Result Date: 01/21/2020 CLINICAL DATA:  Right hip replacement EXAM: PORTABLE PELVIS 1-2 VIEWS COMPARISON:  January 20, 2020 FINDINGS: The patient is status post right hip replacement. Hardware is in good position. IMPRESSION: Patient is status post right hip  replacement.  No other change. Electronically Signed   By: Gerome Samavid  Williams III M.D   On: 01/21/2020 16:57   DG C-Arm 1-60 Min  Result Date: 01/21/2020 CLINICAL DATA:  Right hip replacement EXAM: DG C-ARM 1-60 MIN FLUOROSCOPY TIME:  Fluoroscopy Time:  9 seconds Number of Acquired Spot Images: 2 COMPARISON:  None. FINDINGS: Imaging was obtained after right hip replacement. Hardware is in good position on AP imaging. IMPRESSION: Right hip replacement as above. Electronically Signed   By: Gerome Samavid  Williams III M.D   On: 01/21/2020 16:56   DG HIP OPERATIVE UNILAT WITH PELVIS RIGHT  Result Date: 01/21/2020 CLINICAL DATA:  Right hip hemiarthroplasty. EXAM: OPERATIVE RIGHT HIP (WITH PELVIS IF PERFORMED) 2 VIEWS TECHNIQUE: Fluoroscopic spot image(s) were submitted for interpretation post-operatively. COMPARISON:  01/20/2020. FINDINGS: Total right hip replacement with anatomic alignment. Hardware intact. IMPRESSION: Total right hip replacement anatomic alignment. Electronically Signed   By: Maisie Fushomas  Register   On: 01/21/2020 16:05   DG Femur Min 2 Views Right  Result Date: 01/20/2020 CLINICAL DATA:  Fall onto right hip EXAM: RIGHT FEMUR 2 VIEWS COMPARISON:  None. FINDINGS: Acute right femoral neck fracture. No femoral head dislocation. Mid to distal  femur show no fracture or malalignment. Vascular calcifications. IMPRESSION: Acute right femoral neck fracture Electronically Signed   By: Jasmine PangKim  Fujinaga M.D.   On: 01/20/2020 20:26     The results of significant diagnostics from this hospitalization (including imaging, microbiology, ancillary and laboratory) are listed below for reference.     Microbiology: Recent Results (from the past 240 hour(s))  SARS CORONAVIRUS 2 (TAT 6-24 HRS) Nasopharyngeal Nasopharyngeal Swab     Status: None   Collection Time: 01/20/20 12:57 AM   Specimen: Nasopharyngeal Swab  Result Value Ref Range Status   SARS Coronavirus 2 NEGATIVE NEGATIVE Final    Comment: (NOTE) SARS-CoV-2 target nucleic acids are NOT DETECTED. The SARS-CoV-2 RNA is generally detectable in upper and lower respiratory specimens during the acute phase of infection. Negative results do not preclude SARS-CoV-2 infection, do not rule out co-infections with other pathogens, and should not be used as the sole basis for treatment or other patient management decisions. Negative results must be combined with clinical observations, patient history, and epidemiological information. The expected result is Negative. Fact Sheet for Patients: HairSlick.nohttps://www.fda.gov/media/138098/download Fact Sheet for Healthcare Providers: quierodirigir.comhttps://www.fda.gov/media/138095/download This test is not yet approved or cleared by the [place] FDA and  has been authorized for detection and/or diagnosis of SARS-CoV-2 by FDA under an Emergency Use Authorization (EUA). This EUA will remain  in effect (meaning this test can be used) for the duration of the COVID-19 declaration under Section 56 4(b)(1) of the Act, 21 U.S.C. section 360bbb-3(b)(1), unless the authorization is terminated or revoked sooner. Performed at [facility], [address] [place], [place] [address]   Surgical PCR screen     Status: None   Collection Time: 01/21/20  5:06 AM   Specimen:  Nasal Mucosa; Nasal Swab  Result Value Ref Range Status   MRSA, PCR NEGATIVE NEGATIVE Final   Staphylococcus aureus NEGATIVE NEGATIVE Final    Comment: (NOTE) The Xpert SA Assay (FDA approved for NASAL specimens in patients 77 years of age and older), is one component of a comprehensive surveillance program. It is not intended to diagnose infection nor to guide or monitor treatment. Performed at [facility], [address] [place], [place] [address]      Labs: BNP (last 3 results) No  results for input(s): BNP in the last 8760 hours. Basic Metabolic Panel: Recent Labs  Lab 01/20/20 2136 01/21/20 1808 01/22/20 0533 01/23/20 0618  NA 139  --  134* 135  K 4.4  --  3.8 3.8  CL 104  --  98 101  CO2 23  --  23 24  GLUCOSE 113*  --  139* 107*  BUN 24*  --  20 19  CREATININE 0.96 0.97 0.99 0.98  CALCIUM 9.3  --  8.6* 8.4*   Liver Function Tests: No results for input(s): AST, ALT, ALKPHOS, BILITOT, PROT, ALBUMIN in the last 168 hours. No results for input(s): LIPASE, AMYLASE in the last 168 hours. No results for input(s): AMMONIA in the last 168 hours. CBC: Recent Labs  Lab 01/20/20 2136 01/21/20 1808 01/22/20 0533 01/23/20 0618 01/24/20 0154  WBC 10.6* 9.3 12.8* 11.2* 8.9  NEUTROABS 9.3*  --   --   --   --   HGB 13.7 13.0 12.3* 11.0* 10.7*  HCT 42.1 39.7 37.7* 33.5* 32.3*  MCV 90.3 89.8 90.0 89.8 88.5  PLT 203 181 184 152 158   Cardiac Enzymes: No results for input(s): CKTOTAL, CKMB, CKMBINDEX, TROPONINI in the last 168 hours. BNP: Invalid input(s): POCBNP CBG: No results for input(s): GLUCAP in the last 168 hours. D-Dimer No results for input(s): DDIMER in the last 72 hours. Hgb A1c No results for input(s): HGBA1C in the last 72 hours. Lipid Profile No results for input(s): CHOL, HDL, LDLCALC, TRIG, CHOLHDL, LDLDIRECT in the last 72 hours. Thyroid function studies No results for input(s): TSH, T4TOTAL, T3FREE, THYROIDAB in the last 72 hours.  Invalid  input(s): FREET3 Anemia work up No results for input(s): VITAMINB12, FOLATE, FERRITIN, TIBC, IRON, RETICCTPCT in the last 72 hours. Urinalysis No results found for: COLORURINE, APPEARANCEUR, LABSPEC, PHURINE, GLUCOSEU, HGBUR, BILIRUBINUR, KETONESUR, PROTEINUR, UROBILINOGEN, NITRITE, LEUKOCYTESUR Sepsis Labs Invalid input(s): PROCALCITONIN,  WBC,  LACTICIDVEN Microbiology Recent Results (from the past 240 hour(s))  SARS CORONAVIRUS 2 (TAT 6-24 HRS) Nasopharyngeal Nasopharyngeal Swab     Status: None   Collection Time: 01/20/20 12:57 AM   Specimen: Nasopharyngeal Swab  Result Value Ref Range Status   SARS Coronavirus 2 NEGATIVE NEGATIVE Final    Comment: (NOTE) SARS-CoV-2 target nucleic acids are NOT DETECTED. The SARS-CoV-2 RNA is generally detectable in upper and lower respiratory specimens during the acute phase of infection. Negative results do not preclude SARS-CoV-2 infection, do not rule out co-infections with other pathogens, and should not be used as the sole basis for treatment or other patient management decisions. Negative results must be combined with clinical observations, patient history, and epidemiological information. The expected result is Negative. Fact Sheet for Patients: HairSlick.no Fact Sheet for Healthcare Providers: quierodirigir.com This test is not yet approved or cleared by the [place] FDA and  has been authorized for detection and/or diagnosis of SARS-CoV-2 by FDA under an Emergency Use Authorization (EUA). This EUA will remain  in effect (meaning this test can be used) for the duration of the COVID-19 declaration under Section 56 4(b)(1) of the Act, 21 U.S.C. section 360bbb-3(b)(1), unless the authorization is terminated or revoked sooner. Performed at [facility], [address] [place], [place] [address]   Surgical PCR screen     Status: None   Collection Time: 01/21/20  5:06 AM    Specimen: Nasal Mucosa; Nasal Swab  Result Value Ref Range Status   MRSA, PCR NEGATIVE NEGATIVE Final   Staphylococcus aureus NEGATIVE NEGATIVE Final  Comment: (NOTE) The Xpert SA Assay (FDA approved for NASAL specimens in patients 15 years of age and older), is one component of a comprehensive surveillance program. It is not intended to diagnose infection nor to guide or monitor treatment. Performed at [facility], [address] [place], [place] [address]      Time coordinating discharge in minutes: 65  SIGNED:   Calvert Cantor, MD  [facility] 01/24/2020, 1:32 PM Pager   If 7PM-7AM, please contact night-coverage [email or web address] Password [email or web address]

## 2020-01-24 NOTE — Progress Notes (Addendum)
Inpatient Rehabilitation Admissions Coordinator  I contacted pt's wife by phone to arrange to meet at 1230 today to discuss her husband's rehab venue options.   Danne Baxter, RN, MSN Rehab Admissions Coordinator [phone] 01/24/2020 10:49 AM   I met with patient with his wife present and his daughter, Mardene Celeste, on phone. We dicussed goals and expectations of an inpt rehab admit and they are in agreement. I contacted Dr. Wynelle Cleveland and will make the arrangements to admit today. SW, [place], made aware.  Danne Baxter, RN, MSN Rehab Admissions Coordinator [phone] 01/24/2020 1:09 PM

## 2020-01-24 NOTE — H&P (Signed)
Physical Medicine and Rehabilitation Admission H&P        Chief Complaint  Patient presents with  . 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  Patient currently resides at [facility] assisted living facility.  Presented 01/20/2020 after mechanical fall without loss of consciousness.  Patient states he fell out of his wheelchair with right hip pain.  X-rays and imaging revealed right femoral neck fracture.  Underwent right hip hemiarthroplasty anterior approach 01/21/2020 per Dr.Xu.  Weightbearing as tolerated right lower extremity.  Maintained on Lovenox for DVT prophylaxis.  Acute blood loss anemia 10.7 and monitored.  Therapy evaluation completed and patient was admitted for a comprehensive rehab program.   Review of Systems  Constitutional: Negative for chills and fever.  HENT: Negative for hearing loss.   Eyes: Negative for blurred vision and double vision.  Respiratory: Negative for cough and shortness of breath.   Cardiovascular: Positive for leg swelling. Negative for chest pain and palpitations.  Gastrointestinal: Positive for constipation. Negative for heartburn, nausea and vomiting.  Genitourinary: Negative for dysuria, flank pain and hematuria.  Musculoskeletal: Positive for joint pain and myalgias.  Skin: Negative for rash.  Neurological:       Right side weakness from history of CVA  Psychiatric/Behavioral: Positive for depression and memory loss.  All other systems reviewed and are negative.       Past Medical History:  Diagnosis Date  . Depression    . Hemorrhoids    . Hyperlipemia      History reviewed. No pertinent surgical history. History reviewed. No pertinent family history. Social History:  reports that he has never smoked. He has never used smokeless  tobacco. He reports that he does not use drugs. No history on file for alcohol. Allergies: No Known Allergies       Medications Prior to Admission  Medication Sig Dispense Refill  . acetaminophen (TYLENOL) 325 MG tablet Take 650 mg by mouth every 6 (six) hours as needed for mild pain or moderate pain.      Marland Kitchen aspirin 325 MG EC tablet Take 325 mg by mouth daily.      Marland Kitchen atorvastatin (LIPITOR) 40 MG tablet Take 40 mg by mouth every evening.      . baclofen (LIORESAL) 10 MG tablet Take 10-20 mg by mouth See admin instructions. 10 mg in the morning, 20 mg at bedtime      . calcium carbonate (TUMS - DOSED IN MG ELEMENTAL CALCIUM) 500 MG chewable tablet Chew 2 tablets by mouth every 4 (four) hours as needed for indigestion or heartburn.      . carboxymethylcellulose (REFRESH TEARS) 0.5 % SOLN Place 1 drop into both eyes 3 (three) times daily as needed (Dry eyes, may keep at bedside).      . cholecalciferol (VITAMIN D3) 25 MCG (1000 UNIT) tablet Take 1,000 Units by mouth daily.      Marland Kitchen FLUoxetine (PROZAC) 40 MG capsule Take 40 mg by mouth daily.      Marland Kitchen gabapentin (NEURONTIN) 100 MG capsule Take 100 mg by mouth at bedtime.      . magnesium oxide (MAG-OX) 400 MG tablet Take 400 mg by mouth at bedtime.      . polyethylene glycol (MIRALAX / GLYCOLAX) 17 g packet Take 17 g by mouth  daily.      . sennosides-docusate sodium (SENOKOT-S) 8.6-50 MG tablet Take 2 tablets by mouth daily.      . tamsulosin (FLOMAX) 0.4 MG CAPS capsule Take 0.4 mg by mouth daily after breakfast.      . traZODone (DESYREL) 50 MG tablet Take 25 mg by mouth 3 (three) times daily as needed (anxiety).          Drug Regimen Review Drug regimen was reviewed and remains appropriate with no significant issues identified   Home: Home Living Family/patient expects to be discharged to:: Assisted living Home Equipment: Wheelchair - manual   Functional History: Prior Function Level of Independence: Needs assistance Gait / Transfers  Assistance Needed: pt was ambulating with PT ~500' per daughter with min guard; primarily uses w/c for mobility ADL's / Homemaking Assistance Needed: pt reporting that he performed mostly himself   Functional Status:  Mobility: Bed Mobility Overal bed mobility: Needs Assistance Bed Mobility: Supine to Sit Rolling: Max assist Supine to sit: Max assist, HOB elevated General bed mobility comments: Heavy assist to EOB with HOB elevated and bed pad used Transfers Overall transfer level: Needs assistance Equipment used: 1 person hand held assist Transfers: Sit to/from Stand, Water engineer Transfers Sit to Stand: Max assist, From elevated surface Stand pivot transfers: Max assist, Total assist, From elevated surface General transfer comment: Pt able to assist minimally for sit to stand and does not appear to assist with RLE.    ADL: ADL Overall ADL's : Needs assistance/impaired Eating/Feeding: Set up, Sitting Eating/Feeding Details (indicate cue type and reason): After food cut up, pt able to spoon feed self Grooming: Set up, Sitting Upper Body Bathing: Moderate assistance, Sitting Lower Body Bathing: Maximal assistance, Total assistance, +2 for physical assistance, Cueing for safety, Cueing for sequencing, Sitting/lateral leans, Sit to/from stand Upper Body Dressing : Maximal assistance, Total assistance, Cueing for safety, Sitting, Standing Lower Body Dressing: Maximal assistance, Total assistance, +2 for physical assistance, Cueing for safety, Cueing for sequencing, Sitting/lateral leans, Sit to/from stand Toilet Transfer: Maximal assistance, +2 for physical assistance, Squat-pivot, BSC Toileting- Clothing Manipulation and Hygiene: Minimal assistance, Sitting/lateral lean Toileting - Clothing Manipulation Details (indicate cue type and reason): pt performing in sitting with urinal and wash cloths for pericare Functional mobility during ADLs: Maximal assistance, Cueing for safety, Cueing for  sequencing General ADL Comments: Pt limited by increased pain, decreased mobility and decreased ability to care for self safely.   Cognition: Cognition Overall Cognitive Status: Within Functional Limits for tasks assessed Orientation Level: Oriented X4(Pt is forgetful, needs frequent reorientation) Cognition Arousal/Alertness: Awake/alert Behavior During Therapy: WFL for tasks assessed/performed Overall Cognitive Status: Within Functional Limits for tasks assessed General Comments: pt sweating and reporting "the sedative makes me feel that way."   Physical Exam: Blood pressure (!) 171/90, pulse 98, temperature 98.3 F (36.8 C), temperature source Oral, resp. rate 17, height 5\' 7"  (1.702 m), weight 58.5 kg, SpO2 96 %. Physical Exam  Constitutional: No distress.  Frail appearing  HENT:  Head: Normocephalic and atraumatic.  Nose: Nose normal.  Mouth/Throat: Oropharynx is clear and moist. No oropharyngeal exudate.  Eyes: Pupils are equal, round, and reactive to light. EOM are normal.  Neck: No JVD present. No tracheal deviation present. No thyromegaly present.  Cardiovascular: Normal rate and regular rhythm. Exam reveals no gallop and no friction rub.  No murmur heard. Respiratory: Effort normal and breath sounds normal. No respiratory distress. He has no rales.  GI: Soft. He exhibits no distension.  There is no abdominal tenderness. There is no rebound.  Musculoskeletal:        General: No deformity or edema.  Lymphadenopathy:    He has no cervical adenopathy.  Neurological:  Pt is alert and oriented x 3. Tangential and says he feels that he's "15 feet off the ground". Decreased insight and attention. Speech dysarthric, right central 7. Tongue midline. RUE 0/5. RLE limited by pain, perhaps 1-2/5 prox with HF,KE and trace to absent distally .senses pain in right arm and leg, ?light touch. MAS: RUE 2 at pecs,biceps, 3 wrist and finger flexors; RLE ?1 HF,KE. LUE and LLE 4 to 4+/5. DTR's 3+  RUE and RLE.   Skin: Skin is warm. He is not diaphoretic. No erythema.  Hip incision is dressed with hydrocolloid dressing.  Appropriately tender.  No visible drainage.  Psychiatric:  Pt pleasant slightly confused      Lab Results Last 48 Hours        Results for orders placed or performed during the hospital encounter of 01/20/20 (from the past 48 hour(s))  CBC     Status: Abnormal    Collection Time: 01/23/20  6:18 AM  Result Value Ref Range    WBC 11.2 (H) 4.0 - 10.5 K/uL    RBC 3.73 (L) 4.22 - 5.81 MIL/uL    Hemoglobin 11.0 (L) 13.0 - 17.0 g/dL    HCT 33.5 (L) 39.0 - 52.0 %    MCV 89.8 80.0 - 100.0 fL    MCH 29.5 26.0 - 34.0 pg    MCHC 32.8 30.0 - 36.0 g/dL    RDW 12.8 11.5 - 15.5 %    Platelets 152 150 - 400 K/uL    nRBC 0.0 0.0 - 0.2 %      Comment: Performed at [facility], [place] [address], [place], [place] [address]  Basic metabolic panel     Status: Abnormal    Collection Time: 01/23/20  6:18 AM  Result Value Ref Range    Sodium 135 135 - 145 mmol/L    Potassium 3.8 3.5 - 5.1 mmol/L    Chloride 101 98 - 111 mmol/L    CO2 24 22 - 32 mmol/L    Glucose, Bld 107 (H) 70 - 99 mg/dL    BUN 19 8 - 23 mg/dL    Creatinine, Ser 0.98 0.61 - 1.24 mg/dL    Calcium 8.4 (L) 8.9 - 10.3 mg/dL    GFR calc non Af Amer >60 >60 mL/min    GFR calc Af Amer >60 >60 mL/min    Anion gap 10 5 - 15      Comment: Performed at [facility], [place] [address], [place], [place] [address]  CBC     Status: Abnormal    Collection Time: 01/24/20  1:54 AM  Result Value Ref Range    WBC 8.9 4.0 - 10.5 K/uL    RBC 3.65 (L) 4.22 - 5.81 MIL/uL    Hemoglobin 10.7 (L) 13.0 - 17.0 g/dL    HCT 32.3 (L) 39.0 - 52.0 %    MCV 88.5 80.0 - 100.0 fL    MCH 29.3 26.0 - 34.0 pg    MCHC 33.1 30.0 - 36.0 g/dL    RDW 12.8 11.5 - 15.5 %    Platelets 158 150 - 400 K/uL    nRBC 0.0 0.0 - 0.2 %      Comment: Performed at [facility], [place],  [place] [address]      Imaging Results  (Last 48 hours)  No results found.           Medical Problem List and Plan: 1.  Decreased functional mobility secondary to right FNF.S/P right hemiarthroplasty anterior approach 01/21/2020.  Weightbearing as tolerated. 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 Was at w/c level PTA             -patient may shower             -ELOS/Goals: supervision to min assist. 14-20 days 2.  Antithrombotics: -DVT/anticoagulation: Lovenox.  Check vascular study             -antiplatelet therapy: resume ASA 325mg  daily 3. Pain Management: limit to tylenol, ice pack for now given AMS. 4. Mood: Prozac 40 mf daily             -antipsychotic agents: N/A 5. Neuropsych: This patient is capable of making decisions on his own behalf. 6. Skin/Wound Care: Routine skin checks 7. Fluids/Electrolytes/Nutrition: Routine in and outs with follow-up chemistries 8.  Acute blood loss anemia.  Follow-up CBC 9.  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                .Pt received botox injections by Dr. [facility] on 11/30/2019.             -Patient also on baclofen 10 mg in the morning 20 mg nightly.             -family to bring in resting WHO and day time splint from ALF             -was using w/c predominantly PTA 10.  BPH.  Resume Flomax 0.4 mg daily. 11.  Hyperlipidemia.  Lipitor 12.  Constipation.  Laxative assistance 13. Confusion: Likely medication induced. Hydrocodone dc'ed already             -would go ahead and hold robaxin too.             -low grade temp--- check UA, UCX       14/07/2019, PA-C 01/24/2020  I have personally performed a face to face diagnostic evaluation of this patient and formulated the key components of the plan.  Additionally, I have personally reviewed laboratory data, imaging studies, as well as relevant notes and concur with the physician assistant's documentation above.  The patient's  status has not changed from the original H&P.  Any changes in documentation from the acute care chart have been noted above.  03/23/2020, MD, Ranelle Oyster

## 2020-01-24 NOTE — PMR Pre-admission (Signed)
PMR Admission Coordinator Pre-Admission Assessment  Patient: Joshua Shannon is an 79 y.o., male MRN: [id number] DOB: 19-Jun-1941 Height: _0  (170.2 cm) Weight: 58.5 kg  Insurance Information HMO:     PPO:      PCP:      IPA:      80/20:     OTHER:  PRIMARY: Medicare a and b      Policy#: [id number]      Subscriber: pt Benefits:  Phone #: passport one     Name: 2/1 Eff. Date: a 03/23/2008 b 08/23/2012     Deduct: $1484      Out of Pocket Max: none      Life Max: none CIR: 100%      SNF: 20 full days Outpatient: 80%     Co-Pay: 20% Home Health: 100%      Co-Pay: none DME: 805     Co-Pay: 205 Providers: pt choice  SECONDARY: Roseanna Rainbow      Policy#: [id number]      Subscriber: pt  Medicaid Application Date:       Case Manager:  Disability Application Date:       Case Worker:   The "Data Collection Information Summary" for patients in Inpatient Rehabilitation Facilities with attached "Privacy Act [place] Records" was provided and verbally reviewed with: Family  Emergency Contact Information Contact Information    Name Relation Home Work Mobile   Bordner,Phyllis Spouse [phone]  [phone]   Willim, Turnage Daughter   [phone]      Current Medical History  Patient Admitting Diagnosis: femoral neck fracture  History of Present Illness:79 year old right-handed male with history of hyperlipidemia, depression, BPH, 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  Patient currently resides at [facility] assisted living facility.  Presented 01/20/2020 after mechanical fall without loss of consciousness.  Patient states he fell out of his wheelchair with right hip pain.  X-rays and imaging revealed right femoral neck fracture.  Underwent right hip hemiarthroplasty anterior approach 01/21/2020 per Dr.Xu.  Weightbearing as tolerated right lower extremity.  Maintained on Lovenox for DVT  prophylaxis.  Acute blood loss anemia 10.7 and monitored.     Patient's medical record from [facility] has been reviewed by the rehabilitation admission coordinator and physician.  Past Medical History  Past Medical History:  Diagnosis Date  . Depression   . Hemorrhoids   . Hyperlipemia     Family History   family history is not on file.  Prior Rehab/Hospitalizations Has the patient had prior rehab or hospitalizations prior to admission? Yes  Was at home with wife prior to 04/2019. [facility] 04/2019 after CVA, went to [facility] SNF and then to ALF 06/2019 where he remained until admit.  Has the patient had major surgery during 100 days prior to admission? Yes   Current Medications  Current Facility-Administered Medications:  .  0.9 %  sodium chloride infusion, , Intravenous, Continuous, Leandrew Koyanagi, MD, Last Rate: 75 mL/hr at 01/23/20 1819, Rate Verify at 01/23/20 1819 .  acetaminophen (TYLENOL) tablet 325-650 mg, 325-650 mg, Oral, Q6H PRN, Leandrew Koyanagi, MD, 650 mg at 01/23/20 1734 .  alum & mag hydroxide-simeth (MAALOX/MYLANTA) 200-200-20 MG/5ML suspension 30 mL, 30 mL, Oral, Q4H PRN, Leandrew Koyanagi, MD .  baclofen (LIORESAL) tablet 10 mg, 10 mg, Oral, q morning - 10a, Rizwan, Saima, MD .  baclofen (LIORESAL) tablet 20 mg, 20  mg, Oral, QHS, Rizwan, Saima, MD .  docusate sodium (COLACE) capsule 100 mg, 100 mg, Oral, BID, Leandrew Koyanagi, MD, 100 mg at 01/24/20 1103 .  enoxaparin (LOVENOX) injection 40 mg, 40 mg, Subcutaneous, Q24H, Leandrew Koyanagi, MD, 40 mg at 01/24/20 1103 .  eye wash ((SODIUM/POTASSIUM/SOD CHLORIDE)) ophthalmic solution 1 drop, 1 drop, Both Eyes, BID, Cristal Deer, MD, 1 drop at 01/24/20 1105 .  FLUoxetine (PROZAC) capsule 40 mg, 40 mg, Oral, Daily, Rizwan, Saima, MD .  lactated ringers infusion, , Intravenous, Continuous, Leandrew Koyanagi, MD, Last Rate: 10 mL/hr at 01/21/20 1347, Continued from Pre-op at 01/21/20 1347 .  lactated ringers infusion, ,  Intravenous, Continuous, Ellender, Karyl Kinnier, MD .  magnesium citrate solution 1 Bottle, 1 Bottle, Oral, Once PRN, Leandrew Koyanagi, MD .  menthol-cetylpyridinium (CEPACOL) lozenge 3 mg, 1 lozenge, Oral, PRN, 3 mg at 01/23/20 0537 **OR** phenol (CHLORASEPTIC) mouth spray 1 spray, 1 spray, Mouth/Throat, PRN, Leandrew Koyanagi, MD .  methocarbamol (ROBAXIN) tablet 500 mg, 500 mg, Oral, Q6H PRN, 500 mg at 01/22/20 2115 **OR** methocarbamol (ROBAXIN) 500 mg in dextrose 5 % 50 mL IVPB, 500 mg, Intravenous, Q6H PRN, Leandrew Koyanagi, MD .  mupirocin ointment (BACTROBAN) 2 % 1 application, 1 application, Nasal, BID, Leandrew Koyanagi, MD, 1 application at 29/52/84 1105 .  ondansetron (ZOFRAN) tablet 4 mg, 4 mg, Oral, Q6H PRN **OR** ondansetron (ZOFRAN) injection 4 mg, 4 mg, Intravenous, Q6H PRN, Leandrew Koyanagi, MD, 4 mg at 01/23/20 2019 .  polyethylene glycol (MIRALAX / GLYCOLAX) packet 17 g, 17 g, Oral, Daily PRN, Leandrew Koyanagi, MD, 17 g at 01/22/20 1955 .  polyethylene glycol (MIRALAX / GLYCOLAX) packet 17 g, 17 g, Oral, Daily, Rizwan, Saima, MD .  senna-docusate (Senokot-S) tablet 2 tablet, 2 tablet, Oral, Daily, Rizwan, Saima, MD .  sorbitol 70 % solution 30 mL, 30 mL, Oral, Daily PRN, Leandrew Koyanagi, MD .  Derrill Memo ON 01/25/2020] tamsulosin (FLOMAX) capsule 0.4 mg, 0.4 mg, Oral, QPC breakfast, Rizwan, Saima, MD  Patients Current Diet:  Diet Order            Diet - low sodium heart healthy        Diet regular Room service appropriate? Yes; Fluid consistency: Thin  Diet effective now              Precautions / Restrictions Precautions Precautions: Fall Precaution Comments: residual R sided weakness from previous CVA in May of 2020 Restrictions Weight Bearing Restrictions: Yes RLE Weight Bearing: Weight bearing as tolerated Other Position/Activity Restrictions: WBAT RLE   Has the patient had 2 or more falls or a fall with injury in the past year? Yes  Prior Activity Level Limited Community (1-2x/wk): ALF  since 06/2019. Not living at home since 04/2019  Prior Functional Level Self Care: Did the patient need help bathing, dressing, using the toilet or eating? Needed some help  Indoor Mobility: Did the patient need assistance with walking from room to room (with or without device)? Needed some help  Stairs: Did the patient need assistance with internal or external stairs (with or without device)? Needed some help  Functional Cognition: Did the patient need help planning regular tasks such as shopping or remembering to take medications? Needed some help  Home Assistive Devices / [place] Devices/Equipment: Kasandra Knudsen (specify quad or straight), Wheelchair Home Equipment: Wheelchair - manual  Prior Device Use: Indicate devices/aids used by the patient prior to current illness, exacerbation or  injury? Manual wheelchair and Walker  Current Functional Level Cognition  Overall Cognitive Status: Within Functional Limits for tasks assessed Orientation Level: Oriented to person General Comments: pt sweating and reporting "the sedative makes me feel that way."    Extremity Assessment (includes Sensation/Coordination)  Upper Extremity Assessment: Generalized weakness RUE Deficits / Details: residual deficits from CVA; hand flexed and elbow with rigid flexor tone LUE Deficits / Details: 3+/5 MM grade overall  Lower Extremity Assessment: Defer to PT evaluation RLE Deficits / Details: pt with decreased strength and ROM limitations secondary to post-op pain and weakness; pt also with R sided weakness from previous CVA    ADLs  Overall ADL's : Needs assistance/impaired Eating/Feeding: Set up, Sitting Eating/Feeding Details (indicate cue type and reason): After food cut up, pt able to spoon feed self Grooming: Set up, Sitting Upper Body Bathing: Moderate assistance, Sitting Lower Body Bathing: Maximal assistance, Total assistance, +2 for physical assistance, Cueing for safety, Cueing for  sequencing, Sitting/lateral leans, Sit to/from stand Upper Body Dressing : Maximal assistance, Total assistance, Cueing for safety, Sitting, Standing Lower Body Dressing: Maximal assistance, Total assistance, +2 for physical assistance, Cueing for safety, Cueing for sequencing, Sitting/lateral leans, Sit to/from stand Toilet Transfer: Maximal assistance, +2 for physical assistance, Squat-pivot, BSC Toileting- Clothing Manipulation and Hygiene: Minimal assistance, Sitting/lateral lean Toileting - Clothing Manipulation Details (indicate cue type and reason): pt performing in sitting with urinal and wash cloths for pericare Functional mobility during ADLs: Maximal assistance, Cueing for safety, Cueing for sequencing General ADL Comments: Pt limited by increased pain, decreased mobility and decreased ability to care for self safely.    Mobility  Overal bed mobility: Needs Assistance Bed Mobility: Supine to Sit Rolling: Max assist Supine to sit: Max assist, HOB elevated General bed mobility comments: Heavy assist to EOB with HOB elevated and bed pad used    Transfers  Overall transfer level: Needs assistance Equipment used: 1 person hand held assist Transfers: Sit to/from Stand, Stand Pivot Transfers Sit to Stand: Max assist, From elevated surface Stand pivot transfers: Max assist, Total assist, From elevated surface General transfer comment: Pt able to assist minimally for sit to stand and does not appear to assist with RLE.     Ambulation / Gait / Stairs / Office manager / Balance Dynamic Sitting Balance Sitting balance - Comments: Intermittent assist required Balance Overall balance assessment: Needs assistance Sitting-balance support: Bilateral upper extremity supported, Feet supported Sitting balance-Leahy Scale: Poor Sitting balance - Comments: Intermittent assist required Standing balance support: Bilateral upper extremity supported Standing balance-Leahy  Scale: Poor Standing balance comment: maxA for stability in standing    Special needs/care consideration BiPAP/CPAP  CPM  Continuous Drip IV  Dialysis          Life Vest  Oxygen  Special Bed  Trach Size  Wound Vac  Skin surgical incision Bowel mgmt: incontinent Bladder mgmt: external catheter Diabetic mgmt:  Behavioral consideration  Chemo/radiation  Designated visitor is wife, Silva Bandy Confusion new this admit   Previous Home Environment  Living Arrangements: Other (Comment)([facility] since 06/2019)  Lives With: Other (Comment) Available Help at Discharge: Other (Comment)([facility]F at d/c is goal) Type of Home: Assisted living Home Layout: One level Bathroom Shower/Tub: Chiropodist: Standard Bathroom Accessibility: Yes How Accessible: Accessible via wheelchair, Accessible via walker [place]: No Additional Comments: [facility] since 06/2019  Discharge Living Setting Plans for Discharge Living Setting: ([facility]) Type of  Home at Discharge: [place] Name at Discharge: [place] Discharge Home Layout: One level Discharge Home Access: Level entry Discharge Bathroom Shower/Tub: Tub/shower unit Discharge Bathroom Toilet: Standard Discharge Bathroom Accessibility: Yes How Accessible: Accessible via wheelchair, Accessible via walker Does the patient have any problems obtaining your medications?: No  Social/Family/Support Systems Patient Roles: Spouse, Parent(daughter, Mardene Celeste was OT at [facility] in the past. Lives in [place]) Contact Information: wife, Silva Bandy and daughter, Mardene Celeste Anticipated Caregiver: ALF staff Anticipated Caregiver's Contact Information: see above Caregiver Availability: 24/7 Discharge Plan Discussed with Primary Caregiver: Yes Is Caregiver In Agreement with Plan?: Yes Does Caregiver/Family have Issues with Lodging/Transportation while Pt is in Rehab?: No  Goals/Additional  Needs Patient/Family Goal for Rehab: superivison to min PT and OT, supervision SLP Expected length of stay: [facility] 2 to 3 weeks Special Service Needs: COnfusion is new this admission Pt/Family Agrees to Admission and willing to participate: Yes Program Orientation Provided & Reviewed with Pt/Caregiver Including Roles  & Responsibilities: Yes  Decrease burden of Care through IP rehab admission:   Possible need for SNF placement upon discharge: goal is to return to ALF at [facility] at d/c, but wife and daughter aware that if he does not make to level needed, may need to go to [facility] prior to return to ALF  Patient Condition: I have reviewed medical records from [facility] , spoken with CSW, and patient, spouse and daughter. I met with patient at the bedside for inpatient rehabilitation assessment.  Patient will benefit from ongoing PT, OT and SLP, can actively participate in 3 hours of therapy a day 5 days of the week, and can make measurable gains during the admission.  Patient will also benefit from the coordinated team approach during an Inpatient Acute Rehabilitation admission.  The patient will receive intensive therapy as well as Rehabilitation physician, nursing, social worker, and care management interventions.  Due to bladder management, bowel management, safety, skin/wound care, disease management, medication administration, pain management and patient education the patient requires 24 hour a day rehabilitation nursing.  The patient is currently mod assist with mobility and basic ADLs.  Discharge setting and therapy post discharge at assisted living facility is anticipated.  Patient has agreed to participate in the Acute Inpatient Rehabilitation Program and will admit today.  Preadmission Screen Completed By:  Cleatrice Burke, 01/24/2020 1:41 PM ______________________________________________________________________   Discussed status with Dr. Naaman Plummer  on  01/24/2020 at 1348 and  received approval for admission today.  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 Does the need for close, 24 hr/day Medical supervision in concert with the patient's rehab needs make it unreasonable for this patient to be served in a less intensive setting? Yes 2. Co-Morbidities requiring supervision/potential complications: depression, spastic right hemiparesis 3. Due to bladder management, bowel management, safety, skin/wound care, disease management, medication administration, pain management and patient education, does the patient require 24 hr/day rehab nursing? Yes 4. Does the patient require coordinated care of a physician, rehab nurse, PT, OT, and SLP to address physical and functional deficits in the context of the above medical diagnosis(es)? Yes Addressing deficits in the following areas: balance, endurance, locomotion, strength, transferring, bowel/bladder control, bathing, dressing, feeding, grooming, toileting, cognition and psychosocial support 5. Can the patient actively participate in an intensive therapy program of at least 3 hrs of therapy 5 days a week? Yes 6. The  potential for patient to make measurable gains while on inpatient rehab is excellent 7. Anticipated functional outcomes upon discharge from inpatient rehab: supervision and min assist PT, supervision and min assist OT, modified independent and supervision SLP 8. Estimated rehab length of stay to reach the above functional goals is: 14-20 days 9. Anticipated discharge destination: Home 10. Overall Rehab/Functional Prognosis: excellent   MD Signature: Meredith Staggers, MD, [facility] 01/24/2020

## 2020-01-24 NOTE — Progress Notes (Signed)
Pt arrived to unit via rolling recliner, transferred to recliner in room by staff. Denies pain. Oriented to unit and safety precautions. Pt verbalized understanding. Call bell placed in reach. Will cont to monitor.  Ross Ludwig, RN

## 2020-01-24 NOTE — Progress Notes (Signed)
Physical Therapy Treatment Patient Details Name: Joshua Shannon MRN: [id number] DOB: 19-Jun-1941 Today's Date: 01/24/2020    History of Present Illness Pt is a 79 yo male admitted after falling out of w/c resulting in R femur fx requiring R anterior hip hemiarthroplasty. WBAT RLE. 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    PT Comments    Pt progressing towards goals. Continues to require max A +2 to perform transfers this session. Required assist to move RLE. Educated about seated HEP. Current recommendations appropriate. Will continue to follow acutely.    Follow Up Recommendations  CIR     Equipment Recommendations  None recommended by PT    Recommendations for Other Services       Precautions / Restrictions Precautions Precautions: Fall Precaution Comments: residual R sided weakness from previous CVA in May of 2020 Restrictions Weight Bearing Restrictions: Yes RLE Weight Bearing: Weight bearing as tolerated    Mobility  Bed Mobility               General bed mobility comments: In chair upon entry.   Transfers Overall transfer level: Needs assistance Equipment used: 2 person hand held assist Transfers: Sit to/from [facility] Sit to Stand: Max assist;+2 physical assistance Stand pivot transfers: Max assist;+2 physical assistance       General transfer comment: Pt requiring max A +2 to transfer to and from [facility]. Required assist to move RLE and multimodal cues for sequencing.   Ambulation/Gait                 Stairs             Wheelchair Mobility    Modified Rankin (Stroke Patients Only)       Balance Overall balance assessment: Needs assistance Sitting-balance support: Bilateral upper extremity supported;Feet supported Sitting balance-Leahy Scale: Poor     Standing balance support: Bilateral upper extremity supported Standing balance-Leahy Scale: Poor Standing balance comment: maxA for stability in  standing                            Cognition Arousal/Alertness: Awake/alert Behavior During Therapy: WFL for tasks assessed/performed Overall Cognitive Status: History of cognitive impairments - at baseline                                        Exercises General Exercises - Lower Extremity Ankle Circles/Pumps: PROM;Right;AROM;Left;10 reps Long Arc Quad: AAROM;Right;AROM;Left;10 reps;Seated Heel Slides: PROM;Right;10 reps Hip Flexion/Marching: AAROM;Right;AROM;Left;10 reps;Seated    General Comments General comments (skin integrity, edema, etc.): [name] present during session.       Pertinent Vitals/Pain Pain Assessment: Faces Faces Pain Scale: Hurts little more Pain Location: R hip pain Pain Descriptors / Indicators: Discomfort;Guarding Pain Intervention(s): Limited activity within patient's tolerance;Monitored during session;Repositioned    Home Living   Living Arrangements: Other (Comment)([facility] since 06/2019) Available Help at Discharge: Other (Comment)([facility] at d/c is goal) Type of Home: Assisted living     Home Layout: One level   Additional Comments: [facility] since 06/2019    Prior Function            PT Goals (current goals can now be found in the care plan section) Acute Rehab PT Goals Patient Stated Goal: to go to rehab PT Goal Formulation: With patient/family Time For Goal Achievement: 02/05/20  Potential to Achieve Goals: Good Progress towards PT goals: Progressing toward goals    Frequency    Min 3X/week      PT Plan Current plan remains appropriate    Co-evaluation              AM-PAC PT "6 Clicks" Mobility   Outcome Measure  Help needed turning from your back to your side while in a flat bed without using bedrails?: Total Help needed moving from lying on your back to sitting on the side of a flat bed without using bedrails?: Total Help needed moving to and from a bed to a chair (including  a wheelchair)?: Total Help needed standing up from a chair using your arms (e.g., wheelchair or bedside chair)?: Total Help needed to walk in hospital room?: Total Help needed climbing 3-5 steps with a railing? : Total 6 Click Score: 6    End of Session Equipment Utilized During Treatment: Gait belt Activity Tolerance: Patient tolerated treatment well Patient left: in chair;with call bell/phone within reach;with chair alarm set;with family/visitor present Nurse Communication: Mobility status PT Visit Diagnosis: Other abnormalities of gait and mobility (R26.89);Pain Pain - Right/Left: Right Pain - part of body: Hip     Time: 1610-9604 PT Time Calculation (min) (ACUTE ONLY): 23 min  Charges:  $Therapeutic Exercise: 8-22 mins $Therapeutic Activity: 8-22 mins                     [name], DPT  Acute Rehabilitation Services  Pager: [phone] Office: [phone]    [name] 01/24/2020, 3:52 PM

## 2020-01-24 NOTE — H&P (Signed)
Physical Medicine and Rehabilitation Admission H&P    Chief Complaint  Patient presents with  . 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  Patient currently resides at [facility] assisted living facility.  Presented 01/20/2020 after mechanical fall without loss of consciousness.  Patient states he fell out of his wheelchair with right hip pain.  X-rays and imaging revealed right femoral neck fracture.  Underwent right hip hemiarthroplasty anterior approach 01/21/2020 per Dr.Xu.  Weightbearing as tolerated right lower extremity.  Maintained on Lovenox for DVT prophylaxis.  Acute blood loss anemia 10.7 and monitored.  Therapy evaluation completed and patient was admitted for a comprehensive rehab program.  Review of Systems  Constitutional: Negative for chills and fever.  HENT: Negative for hearing loss.   Eyes: Negative for blurred vision and double vision.  Respiratory: Negative for cough and shortness of breath.   Cardiovascular: Positive for leg swelling. Negative for chest pain and palpitations.  Gastrointestinal: Positive for constipation. Negative for heartburn, nausea and vomiting.  Genitourinary: Negative for dysuria, flank pain and hematuria.  Musculoskeletal: Positive for joint pain and myalgias.  Skin: Negative for rash.  Neurological:       Right side weakness from history of CVA  Psychiatric/Behavioral: Positive for depression and memory loss.  All other systems reviewed and are negative.  Past Medical History:  Diagnosis Date  . Depression   . Hemorrhoids   . Hyperlipemia    History reviewed. No pertinent surgical history. History reviewed. No pertinent family history. Social History:  reports that he has never smoked. He has never used smokeless tobacco. He reports  that he does not use drugs. No history on file for alcohol. Allergies: No Known Allergies Medications Prior to Admission  Medication Sig Dispense Refill  . acetaminophen (TYLENOL) 325 MG tablet Take 650 mg by mouth every 6 (six) hours as needed for mild pain or moderate pain.    Marland Kitchen aspirin 325 MG EC tablet Take 325 mg by mouth daily.    Marland Kitchen atorvastatin (LIPITOR) 40 MG tablet Take 40 mg by mouth every evening.    . baclofen (LIORESAL) 10 MG tablet Take 10-20 mg by mouth See admin instructions. 10 mg in the morning, 20 mg at bedtime    . calcium carbonate (TUMS - DOSED IN MG ELEMENTAL CALCIUM) 500 MG chewable tablet Chew 2 tablets by mouth every 4 (four) hours as needed for indigestion or heartburn.    . carboxymethylcellulose (REFRESH TEARS) 0.5 % SOLN Place 1 drop into both eyes 3 (three) times daily as needed (Dry eyes, may keep at bedside).    . cholecalciferol (VITAMIN D3) 25 MCG (1000 UNIT) tablet Take 1,000 Units by mouth daily.    Marland Kitchen FLUoxetine (PROZAC) 40 MG capsule Take 40 mg by mouth daily.    Marland Kitchen gabapentin (NEURONTIN) 100 MG capsule Take 100 mg by mouth at bedtime.    . magnesium oxide (MAG-OX) 400 MG tablet Take 400 mg by mouth at bedtime.    . polyethylene glycol (MIRALAX / GLYCOLAX) 17 g packet Take 17 g by mouth daily.    . sennosides-docusate sodium (SENOKOT-S) 8.6-50 MG tablet Take 2 tablets by mouth daily.    . tamsulosin (FLOMAX) 0.4 MG CAPS capsule Take 0.4 mg by mouth daily after breakfast.    . traZODone (DESYREL) 50  MG tablet Take 25 mg by mouth 3 (three) times daily as needed (anxiety).      Drug Regimen Review Drug regimen was reviewed and remains appropriate with no significant issues identified  Home: Home Living Family/patient expects to be discharged to:: Assisted living Home Equipment: Wheelchair - manual   Functional History: Prior Function Level of Independence: Needs assistance Gait / Transfers Assistance Needed: pt was ambulating with PT ~500' per daughter  with min guard; primarily uses w/c for mobility ADL's / Homemaking Assistance Needed: pt reporting that he performed mostly himself  Functional Status:  Mobility: Bed Mobility Overal bed mobility: Needs Assistance Bed Mobility: Supine to Sit Rolling: Max assist Supine to sit: Max assist, HOB elevated General bed mobility comments: Heavy assist to EOB with HOB elevated and bed pad used Transfers Overall transfer level: Needs assistance Equipment used: 1 person hand held assist Transfers: Sit to/from Stand, Water engineer Transfers Sit to Stand: Max assist, From elevated surface Stand pivot transfers: Max assist, Total assist, From elevated surface General transfer comment: Pt able to assist minimally for sit to stand and does not appear to assist with RLE.       ADL: ADL Overall ADL's : Needs assistance/impaired Eating/Feeding: Set up, Sitting Eating/Feeding Details (indicate cue type and reason): After food cut up, pt able to spoon feed self Grooming: Set up, Sitting Upper Body Bathing: Moderate assistance, Sitting Lower Body Bathing: Maximal assistance, Total assistance, +2 for physical assistance, Cueing for safety, Cueing for sequencing, Sitting/lateral leans, Sit to/from stand Upper Body Dressing : Maximal assistance, Total assistance, Cueing for safety, Sitting, Standing Lower Body Dressing: Maximal assistance, Total assistance, +2 for physical assistance, Cueing for safety, Cueing for sequencing, Sitting/lateral leans, Sit to/from stand Toilet Transfer: Maximal assistance, +2 for physical assistance, Squat-pivot, BSC Toileting- Clothing Manipulation and Hygiene: Minimal assistance, Sitting/lateral lean Toileting - Clothing Manipulation Details (indicate cue type and reason): pt performing in sitting with urinal and wash cloths for pericare Functional mobility during ADLs: Maximal assistance, Cueing for safety, Cueing for sequencing General ADL Comments: Pt limited by increased  pain, decreased mobility and decreased ability to care for self safely.  Cognition: Cognition Overall Cognitive Status: Within Functional Limits for tasks assessed Orientation Level: Oriented X4(Pt is forgetful, needs frequent reorientation) Cognition Arousal/Alertness: Awake/alert Behavior During Therapy: WFL for tasks assessed/performed Overall Cognitive Status: Within Functional Limits for tasks assessed General Comments: pt sweating and reporting "the sedative makes me feel that way."  Physical Exam: Blood pressure (!) 171/90, pulse 98, temperature 98.3 F (36.8 C), temperature source Oral, resp. rate 17, height 5\' 7"  (1.702 m), weight 58.5 kg, SpO2 96 %. Physical Exam  Constitutional: No distress.  Frail appearing  HENT:  Head: Normocephalic and atraumatic.  Nose: Nose normal.  Mouth/Throat: Oropharynx is clear and moist. No oropharyngeal exudate.  Eyes: Pupils are equal, round, and reactive to light. EOM are normal.  Neck: No JVD present. No tracheal deviation present. No thyromegaly present.  Cardiovascular: Normal rate and regular rhythm. Exam reveals no gallop and no friction rub.  No murmur heard. Respiratory: Effort normal and breath sounds normal. No respiratory distress. He has no rales.  GI: Soft. He exhibits no distension. There is no abdominal tenderness. There is no rebound.  Musculoskeletal:        General: No deformity or edema.  Lymphadenopathy:    He has no cervical adenopathy.  Neurological:  Pt is alert and oriented x 3. Tangential and says he feels that he's "15 feet off  the ground". Decreased insight and attention. Speech dysarthric, right central 7. Tongue midline. RUE 0/5. RLE limited by pain, perhaps 1-2/5 prox with HF,KE and trace to absent distally .senses pain in right arm and leg, ?light touch. MAS: RUE 2 at pecs,biceps, 3 wrist and finger flexors; RLE ?1 HF,KE. LUE and LLE 4 to 4+/5. DTR's 3+ RUE and RLE.   Skin: Skin is warm. He is not diaphoretic.  No erythema.  Hip incision is dressed with hydrocolloid dressing.  Appropriately tender.  No visible drainage.  Psychiatric:  Pt pleasant slightly confused    Results for orders placed or performed during the hospital encounter of 01/20/20 (from the past 48 hour(s))  CBC     Status: Abnormal   Collection Time: 01/23/20  6:18 AM  Result Value Ref Range   WBC 11.2 (H) 4.0 - 10.5 K/uL   RBC 3.73 (L) 4.22 - 5.81 MIL/uL   Hemoglobin 11.0 (L) 13.0 - 17.0 g/dL   HCT 37.1 (L) 69.6 - 78.9 %   MCV 89.8 80.0 - 100.0 fL   MCH 29.5 26.0 - 34.0 pg   MCHC 32.8 30.0 - 36.0 g/dL   RDW 38.1 01.7 - 51.0 %   Platelets 152 150 - 400 K/uL   nRBC 0.0 0.0 - 0.2 %    Comment: Performed at [facility], [address], [place], [place] [address]  Basic metabolic panel     Status: Abnormal   Collection Time: 01/23/20  6:18 AM  Result Value Ref Range   Sodium 135 135 - 145 mmol/L   Potassium 3.8 3.5 - 5.1 mmol/L   Chloride 101 98 - 111 mmol/L   CO2 24 22 - 32 mmol/L   Glucose, Bld 107 (H) 70 - 99 mg/dL   BUN 19 8 - 23 mg/dL   Creatinine, Ser 7.78 0.61 - 1.24 mg/dL   Calcium 8.4 (L) 8.9 - 10.3 mg/dL   GFR calc non Af Amer >60 >60 mL/min   GFR calc Af Amer >60 >60 mL/min   Anion gap 10 5 - 15    Comment: Performed at [facility], [address], [place], [place] [address]  CBC     Status: Abnormal   Collection Time: 01/24/20  1:54 AM  Result Value Ref Range   WBC 8.9 4.0 - 10.5 K/uL   RBC 3.65 (L) 4.22 - 5.81 MIL/uL   Hemoglobin 10.7 (L) 13.0 - 17.0 g/dL   HCT 36.1 (L) 44.3 - 15.4 %   MCV 88.5 80.0 - 100.0 fL   MCH 29.3 26.0 - 34.0 pg   MCHC 33.1 30.0 - 36.0 g/dL   RDW 00.8 67.6 - 19.5 %   Platelets 158 150 - 400 K/uL   nRBC 0.0 0.0 - 0.2 %    Comment: Performed at [facility], [address] [place], [place] [address]   No results found.     Medical Problem List and Plan: 1.  Decreased functional mobility secondary to right FNF.S/P right hemiarthroplasty anterior  approach 01/21/2020.  Weightbearing as tolerated. 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 Was at w/c level PTA  -patient may shower  -ELOS/Goals: supervision to min assist. 14-20 days 2.  Antithrombotics: -DVT/anticoagulation: Lovenox.  Check vascular study  -antiplatelet therapy: resume ASA 325mg  daily 3. Pain Management: limit to tylenol, ice pack for now given AMS. 4. Mood: Prozac 40 mf daily  -antipsychotic agents: N/A 5. Neuropsych:  This patient is capable of making decisions on his own behalf. 6. Skin/Wound Care: Routine skin checks 7. Fluids/Electrolytes/Nutrition: Routine in and outs with follow-up chemistries 8.  Acute blood loss anemia.  Follow-up CBC 9.  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     .Pt received botox injections by Dr. Leanna Battles [facility] on 11/30/2019.  -Patient also on baclofen 10 mg in the morning 20 mg nightly.  -family to bring in resting WHO and day time splint from ALF  -was using w/c predominantly PTA 10.  BPH.  Resume Flomax 0.4 mg daily. 11.  Hyperlipidemia.  Lipitor 12.  Constipation.  Laxative assistance 13. Confusion: Likely medication induced. Hydrocodone dc'ed already  -would go ahead and hold robaxin too.  -low grade temp--- check UA, Verlin Grills, PA-C 01/24/2020

## 2020-01-25 ENCOUNTER — Inpatient Hospital Stay (HOSPITAL_COMMUNITY): Payer: Medicare Other

## 2020-01-25 ENCOUNTER — Inpatient Hospital Stay (HOSPITAL_COMMUNITY): Payer: Medicare Other | Admitting: Occupational Therapy

## 2020-01-25 ENCOUNTER — Inpatient Hospital Stay (HOSPITAL_COMMUNITY): Payer: Medicare Other | Admitting: Speech Pathology

## 2020-01-25 DIAGNOSIS — S72001D Fracture of unspecified part of neck of right femur, subsequent encounter for closed fracture with routine healing: Secondary | ICD-10-CM

## 2020-01-25 DIAGNOSIS — M7989 Other specified soft tissue disorders: Secondary | ICD-10-CM

## 2020-01-25 LAB — CBC WITH DIFFERENTIAL/PLATELET
Abs Immature Granulocytes: 0.02 10*3/uL (ref 0.00–0.07)
Basophils Absolute: 0 10*3/uL (ref 0.0–0.1)
Basophils Relative: 0 %
Eosinophils Absolute: 0.2 10*3/uL (ref 0.0–0.5)
Eosinophils Relative: 2 %
HCT: 31.9 % — ABNORMAL LOW (ref 39.0–52.0)
Hemoglobin: 10.5 g/dL — ABNORMAL LOW (ref 13.0–17.0)
Immature Granulocytes: 0 %
Lymphocytes Relative: 14 %
Lymphs Abs: 1 10*3/uL (ref 0.7–4.0)
MCH: 29.5 pg (ref 26.0–34.0)
MCHC: 32.9 g/dL (ref 30.0–36.0)
MCV: 89.6 fL (ref 80.0–100.0)
Monocytes Absolute: 0.6 10*3/uL (ref 0.1–1.0)
Monocytes Relative: 9 %
Neutro Abs: 5.1 10*3/uL (ref 1.7–7.7)
Neutrophils Relative %: 75 %
Platelets: 196 10*3/uL (ref 150–400)
RBC: 3.56 MIL/uL — ABNORMAL LOW (ref 4.22–5.81)
RDW: 12.8 % (ref 11.5–15.5)
WBC: 6.8 10*3/uL (ref 4.0–10.5)
nRBC: 0 % (ref 0.0–0.2)

## 2020-01-25 LAB — COMPREHENSIVE METABOLIC PANEL
ALT: 17 U/L (ref 0–44)
AST: 23 U/L (ref 15–41)
Albumin: 2.3 g/dL — ABNORMAL LOW (ref 3.5–5.0)
Alkaline Phosphatase: 56 U/L (ref 38–126)
Anion gap: 10 (ref 5–15)
BUN: 19 mg/dL (ref 8–23)
CO2: 24 mmol/L (ref 22–32)
Calcium: 8.4 mg/dL — ABNORMAL LOW (ref 8.9–10.3)
Chloride: 105 mmol/L (ref 98–111)
Creatinine, Ser: 0.93 mg/dL (ref 0.61–1.24)
GFR calc Af Amer: >60 mL/min (ref >60)
GFR calc non Af Amer: >60 mL/min (ref >60)
Glucose, Bld: 107 mg/dL — ABNORMAL HIGH (ref 70–99)
Potassium: 3.4 mmol/L — ABNORMAL LOW (ref 3.5–5.1)
Sodium: 139 mmol/L (ref 135–145)
Total Bilirubin: 1 mg/dL (ref 0.3–1.2)
Total Protein: 5.1 g/dL — ABNORMAL LOW (ref 6.5–8.1)

## 2020-01-25 LAB — URINALYSIS, COMPLETE (UACMP) WITH MICROSCOPIC
Bacteria, UA: NONE SEEN
Bilirubin Urine: NEGATIVE
Glucose, UA: NEGATIVE mg/dL
Hgb urine dipstick: NEGATIVE
Ketones, ur: 20 mg/dL — AB
Leukocytes,Ua: NEGATIVE
Nitrite: NEGATIVE
Protein, ur: NEGATIVE mg/dL
Specific Gravity, Urine: 1.024 (ref 1.005–1.030)
pH: 5 (ref 5.0–8.0)

## 2020-01-25 LAB — URINE CULTURE: Culture: NO GROWTH

## 2020-01-25 NOTE — Evaluation (Signed)
Occupational Therapy Assessment and Plan  Patient Details  Name: DEMARIUS ARCHILA MRN: [id number] Date of Birth: May 07, 1941  OT Diagnosis: abnormal posture, acute pain, cognitive deficits, hemiplegia affecting dominant side, muscle weakness (generalized) and pain in joint Rehab Potential: Rehab Potential (ACUTE ONLY): Good ELOS: 12-14 days   Today's Date: 01/25/2020 OT Individual Time: 2878-6767 OT Individual Time Calculation (min): 81 min     Problem List:  Patient Active Problem List   Diagnosis Date Noted  . Right femoral fracture ([place]) 01/24/2020  . Closed displaced fracture of right femoral neck ([place]) 01/20/2020  . Fall at home, initial encounter 01/20/2020    Past Medical History:  Past Medical History:  Diagnosis Date  . Depression   . Hemorrhoids   . Hyperlipemia    Past Surgical History:  Past Surgical History:  Procedure Laterality Date  . ANTERIOR APPROACH HEMI HIP ARTHROPLASTY Right 01/21/2020   Procedure: ANTERIOR APPROACH HEMI HIP ARTHROPLASTY;  Surgeon: Leandrew Koyanagi, MD;  Location: [place];  Service: Orthopedics;  Laterality: Right;    Assessment & Plan Clinical Impression: Patient is a 79 y.o. year old male with recent admission to the hospital on 01/20/2020 after mechanical fall without loss of consciousness. Patient states he fell out of his wheelchair with right hip pain. X-rays and imaging revealed right femoral neck fracture. Underwent righthip hemiarthroplasty anterior approach 01/21/2020 per Dr.Xu.  Patient transferred to CIR on 01/24/2020 .    Patient currently requires max with basic self-care skills secondary to muscle weakness, impaired timing and sequencing, abnormal tone, unbalanced muscle activation and decreased coordination, decreased awareness, decreased problem solving, decreased safety awareness and decreased memory and decreased sitting balance, decreased standing balance, decreased postural control, hemiplegia and decreased balance strategies.  Prior  to hospitalization, patient could complete ADLs with min.  Patient will benefit from skilled intervention to decrease level of assist with basic self-care skills and increase independence with basic self-care skills prior to discharge to ALF.  Anticipate patient will require minimal physical assistance and follow up home health.  OT - End of Session Activity Tolerance: Tolerates 30+ min activity with multiple rests Endurance Deficit: Yes OT Assessment Rehab Potential (ACUTE ONLY): Good OT Patient demonstrates impairments in the following area(s): Balance;Pain;Safety;Cognition;Endurance;Motor;Sensory OT Basic ADL's Functional Problem(s): Grooming;Bathing;Dressing;Toileting OT Transfers Functional Problem(s): Toilet;Tub/Shower OT Additional Impairment(s): Fuctional Use of Upper Extremity OT Plan OT Intensity: Minimum of 1-2 x/day, 45 to 90 minutes OT Frequency: 5 out of 7 days OT Duration/Estimated Length of Stay: 12-14 days OT Treatment/Interventions: Teacher, English as a foreign language;Discharge planning;Cognitive remediation/compensation;Community reintegration;Disease mangement/prevention;Functional mobility training;Patient/family education;Therapeutic Exercise;Therapeutic Activities;Self Care/advanced ADL retraining;Functional electrical stimulation;Pain management;UE/LE Coordination activities;Splinting/orthotics;Neuromuscular re-education;UE/LE Strength taining/ROM;Wheelchair propulsion/positioning OT Self Feeding Anticipated Outcome(s): modified independent OT Basic Self-Care Anticipated Outcome(s): min assist OT Toileting Anticipated Outcome(s): min assist OT Bathroom Transfers Anticipated Outcome(s): min assist OT Recommendation Recommendations for Other Services: Neuropsych consult Patient destination: Assisted Living Follow Up Recommendations: Other (comment)(ALF min assist level) Equipment Recommended: To be determined   Skilled Therapeutic  Intervention Pt seen for OT evaluation.  Pt was oriented to place and that he was here because he had a CVA and fell.  He was very agreeable to therapy and eager to participate.  Had him work on bathing and dressing sit to stand at the sink.  His spouse came into the session and was informative with providing information of PLOF at the ALF.  She reports pt having two hand splints as well to wear and night and during the day on the  RUE.  Therapist instructed her to bring those in so he continue wearing them.  He was able to complete UB bathing with min assist.  He was unable to integrate the RUE into the task with moderate digit flexor tone noted as well elbow flexion.  He needed min assist for thorough washing of the LUE.  He needed max facilitation for sit to stand during LB bathing secondary to increased flexor tone in the right hamstrings and decreased activation of the quads.  He also exhibited increased trunk flexion in standing as well.  He was able to donn his pullover shirt with supervision but needed max assist for donning brief, pants, TEDs, and gripper socks overall, secondary to the sit to stand component.  He only needed min assist for crossing the RLE over the left knee for donning his pants and his sock, without a moderate increase in pain.  RUE and RLE tone continues to be the most limiting factor noted during this session.  Pt was left in the wheelchair with safety belt in place and call button and phone in reach.  Discussed with pt and spouse expectations for LOS around 2 weeks and min assist level goals.  Pt's spouse with check with the ALF to make sure they can provide care at this level.    OT Evaluation Precautions/Restrictions  Precautions Precautions: Fall Precaution Comments: residual R sided weakness from previous CVA in May of 2020 Restrictions Weight Bearing Restrictions: Yes RLE Weight Bearing: Weight bearing as tolerated   Pain Pain Assessment Pain Scale: Faces Faces Pain  Scale: Hurts a little bit Pain Type: Surgical pain Pain Location: Hip Pain Orientation: Right Pain Descriptors / Indicators: Discomfort Pain Onset: With Activity Home Living/Prior [place] expects to be discharged to:: [place]: Other (Comment)([facility]) Available Help at Discharge: Other (Comment)(ALF since CVA) Type of Home: Assisted living Home Layout: One level Bathroom Shower/Tub: Chiropodist: Standard Bathroom Accessibility: Yes Additional Comments: [facility] since 06/2019  Lives With: Alone IADL History Homemaking Responsibilities: No Occupation: Retired Prior Function Level of Independence: Requires assistive device for independence, Needs assistance with ADLs(quad cane and min assist for mobility since CVA) Bath: Minimal Toileting: Minimal Dressing: Supervision/set-up Vocation: Retired Comments: gait 500' with SPC previously at assisted living with assistance ADL ADL Eating: Minimal assistance Where Assessed-Eating: Wheelchair Grooming: Minimal assistance Where Assessed-Grooming: Clinical biochemist Bathing: Minimal assistance Where Assessed-Upper Body Bathing: Wheelchair Lower Body Bathing: Maximal assistance Where Assessed-Lower Body Bathing: Wheelchair Upper Body Dressing: Supervision/safety Where Assessed-Upper Body Dressing: Wheelchair Lower Body Dressing: Maximal assistance Where Assessed-Lower Body Dressing: Wheelchair Toileting: Maximal assistance Toilet Transfer: Dependent(Stedy) Science writer: Bedside commode Vision Baseline Vision/History: Wears glasses Wears Glasses: At all times(As needed per spouse and pt) Patient Visual Report: No change from baseline Vision Assessment?: No apparent visual deficits Perception  Perception: Within Functional Limits Praxis Praxis: Intact Cognition Overall Cognitive Status:  Impaired/Different from baseline Arousal/Alertness: Awake/alert Orientation Level: Person;Place;Situation Person: Oriented Place: Oriented Situation: Disoriented(stated he had a stroke and a fall, but did not separate the two until questioned about it further) Year: 2021 Month: February Day of Week: Correct Memory: Impaired Memory Impairment: Decreased recall of new information;Decreased short term memory Immediate Memory Recall: Sock;Blue;Bed Memory Recall Sock: Without Cue Memory Recall Blue: With Cue Memory Recall Bed: Not able to recall Attention: Sustained Sustained Attention: Appears intact Selective Attention: Impaired Selective Attention Impairment: Functional basic Awareness: Impaired Awareness Impairment: Anticipatory impairment Comments: increased  confusion during second half of session, initially A&O x 4 but then saying "this is all a sharade and I am playing your games" towards end of session and calling therapist by a different name Sensation Sensation Light Touch: Impaired Detail Light Touch Impaired Details: Impaired RUE Hot/Cold: Not tested Proprioception: Not tested Stereognosis: Impaired Detail Stereognosis Impaired Details: Impaired RUE Additional Comments: Decreased light touch in the right arm and hand compared the left. Coordination Gross Motor Movements are Fluid and Coordinated: No Fine Motor Movements are Fluid and Coordinated: No Coordination and Movement Description: Pt with increased tone in the elbow flexors and digit flexors.  No active functional movement noted in the arm, total assist is needed for integration into a functional task as a gross assist. Motor  Motor Motor: Hemiplegia Motor - Skilled Clinical Observations: Pt with increased hemiparesis and increased tone in the RUE and RLE from recent CVA. Mobility  Transfers Sit to Stand: Maximal Assistance - Patient 25-49% Stand to Sit: Maximal Assistance - Patient 25-49%  Trunk/Postural  Assessment  Cervical Assessment Cervical Assessment: Exceptions to WFL(forward cervical flexion) Thoracic Assessment Thoracic Assessment: Exceptions to WFL(slight thoracic kyphosis) Lumbar Assessment Lumbar Assessment: Exceptions to WFL(lumbar flexion in standing)  Balance Balance Balance Assessed: Yes Static Sitting Balance Static Sitting - Level of Assistance: 5: Stand by assistance Dynamic Sitting Balance Dynamic Sitting - Balance Support: During functional activity Dynamic Sitting - Level of Assistance: 4: Min assist Static Standing Balance Static Standing - Balance Support: During functional activity Static Standing - Level of Assistance: 3: Mod assist Dynamic Standing Balance Dynamic Standing - Balance Support: During functional activity Dynamic Standing - Level of Assistance: 1: +1 Total assist Extremity/Trunk Assessment RUE Assessment RUE Assessment: Exceptions to [facility] Passive Range of Motion (PROM) Comments: Shoulder flexion 0-100 degrees, elbow flexion/extension impaired with increased tone throughout movement Active Range of Motion (AROM) Comments: No AROM noted General Strength Comments: Pt with no active ROM, needs max hand over hand for integration of the RUE into functional tasks at this time.  Hand stays in closed position with elbow flexion present at rest.  Pt's spouse reports having two different splints for wear that she will bring in next visit. RUE Body System: Neuro Brunstrum levels for arm and hand: Arm;Hand Brunstrum level for arm: Stage II Synergy is developing Brunstrum level for hand: Stage II Synergy is developing RUE Tone RUE Tone: Severe LUE Assessment LUE Assessment: Within Functional Limits     Refer to Care Plan for Long Term Goals  Recommendations for other services: Neuropsych   Discharge Criteria: Patient will be discharged from OT if patient refuses treatment 3 consecutive times without medical reason, if treatment goals not met, if  there is a change in medical status, if patient makes no progress towards goals or if patient is discharged from hospital.  The above assessment, treatment plan, treatment alternatives and goals were discussed and mutually agreed upon: by patient and by family  Sheilia Reznick OTR/L 01/25/2020, 4:35 PM

## 2020-01-25 NOTE — Evaluation (Addendum)
Speech Language Pathology Assessment and Plan  Patient Details  Name: Joshua Shannon MRN: [id number] Date of Birth: 1941/06/04  SLP Diagnosis: Dysarthria;Cognitive Impairments;Speech and Language deficits;Dysphagia  Rehab Potential: Good ELOS: 2-3 weeks    Today's Date: 01/25/2020 SLP Individual Time: 3149-7026 SLP Individual Time Calculation (min): 45 min   Problem List:  Patient Active Problem List   Diagnosis Date Noted  . Right femoral fracture ([place]) 01/24/2020  . Closed displaced fracture of right femoral neck ([place]) 01/20/2020  . Fall at home, initial encounter 01/20/2020   Past Medical History:  Past Medical History:  Diagnosis Date  . Depression   . Hemorrhoids   . Hyperlipemia    Past Surgical History:  Past Surgical History:  Procedure Laterality Date  . 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 Presented 01/20/2020 after mechanical fall without loss of consciousness. Patient states he fell out of his wheelchair with right hip pain. X-rays and imaging revealed right femoral neck fracture. Underwent righthip hemiarthroplasty anterior approach 01/21/2020 per Joshua Shannon. Weightbearing as tolerated right lower extremity. Maintained on Lovenox for DVT prophylaxis. Acute blood loss anemia 10.7 and monitored. Therapy evaluation completed and patient was admitted for a comprehensive rehab program on 01/24/20. SLP evaluations were completed 01/25/20 with results as  follows:  Although pt has no history of dysphagia, pt's RN noted consistent pattern of coughing with thin liquids. PA contacted and BSE was recommended and therefore administered by SLP. Pt did present with immediate cough following 90% sips of thin liquid via straw. When straw removed and verbal cues for smaller and slower sips provided, no overt s/sx aspiration were observed. Pt also consumed puree and regular texture breakfast solids with efficient mastication and oral clearance. Therefore, recommend continue regular diet with thin liquids, but NO straws. ST will follow up with pt for 1-2 additional sessions to ensure tolerance of thin with no straws.   Pt with baseline dysarthria and short-term memory deficits s/p hx CVA, however increased confusion noted by family and SLP. Pt disoriented to place and situation and presented with functional problem solving deficits requiring moderate cues during basic tasks. Given that pt's goal is to return to ALF, pt would benefit from skilled ST interventions to address acute cognitive changes/exaccerbations (orientation, problem solving, awareness) in order to maximize safety and functional independence.    Skilled Therapeutic Interventions          Bedside swallow and cognitive-linguistic evaluations were administered and results were reviewed with pt (please see above for details).   SLP Assessment  Patient will need skilled Speech Lanaguage Pathology Services during CIR admission    Recommendations  SLP Diet Recommendations: Age appropriate regular solids;Thin;Other (Comment)(no straws) Liquid Administration via: No straw;Cup Medication Administration: Whole meds with liquid Supervision: Patient able to self feed;Intermittent supervision to cue for compensatory strategies Compensations: Minimize environmental distractions;Slow rate;Small sips/bites Postural Changes and/or Swallow Maneuvers: Seated upright 90 degrees;Upright 30-60 min after meal Oral Care  Recommendations: Oral care BID Patient destination: Assisted Living Follow up Recommendations: 24 hour supervision/assistance;Home Health SLP Equipment Recommended: None recommended  by SLP    SLP Frequency 3 to 5 out of 7 days   SLP Duration  SLP Intensity  SLP Treatment/Interventions 2-3 weeks  Minumum of 1-2 x/day, 30 to 90 minutes  Cognitive remediation/compensation;Dysphagia/aspiration precaution training;Speech/Language facilitation;Cueing hierarchy;Functional tasks;Patient/family education;Environmental controls    Pain Pain Assessment Pain Scale: 0-10 Pain Score: 0-No pain  Prior Functioning Type of Home: Assisted living  Lives With: Alone Available Help at Discharge: Other (Comment)([facility] at d/c is goal; staff available prn) Vocation: Retired  SLP Evaluation Cognition Overall Cognitive Status: No family/caregiver present to determine baseline cognitive functioning Arousal/Alertness: Awake/alert Orientation Level: Oriented X4;Oriented to person;Oriented to place;Oriented to time;Oriented to situation Memory: Impaired Memory Impairment: Decreased recall of new information Executive Function: Organizing Organizing: Impaired Organizing Impairment: Verbal basic;Functional basic Safety/Judgment: Appears intact  Comprehension Auditory Comprehension Overall Auditory Comprehension: Appears within functional limits for tasks assessed Yes/No Questions: Within Functional Limits Conversation: Simple Visual Recognition/Discrimination Discrimination: Not tested Reading Comprehension Reading Status: Not tested Expression Expression Primary Mode of Expression: Verbal Verbal Expression Overall Verbal Expression: Appears within functional limits for tasks assessed Initiation: No impairment Level of Generative/Spontaneous Verbalization: Sentence Repetition: No impairment Naming: No impairment Pragmatics: No impairment Interfering Components: Speech  intelligibility Non-Verbal Means of Communication: Not applicable Written Expression Written Expression: Not tested Oral Motor Oral Motor/Sensory Function Overall Oral Motor/Sensory Function: Mild impairment Facial ROM: Reduced right Facial Symmetry: Abnormal symmetry right Facial Strength: Reduced right Lingual Symmetry: Within Functional Limits Lingual Strength: Reduced Mandible: Within Functional Limits Motor Speech Overall Motor Speech: Impaired at baseline Respiration: Within functional limits Resonance: Within functional limits Articulation: Impaired Level of Impairment: Sentence Intelligibility: Intelligibility reduced Word: 75-100% accurate Phrase: 75-100% accurate Sentence: 50-74% accurate Conversation: 50-74% accurate Motor Planning: Witnin functional limits Motor Speech Errors: Not applicable Interfering Components: Premorbid status Effective Techniques: Slow rate;Increased vocal intensity;Over-articulate    Intelligibility: Intelligibility reduced Word: 75-100% accurate Phrase: 75-100% accurate Sentence: 50-74% accurate Conversation: 50-74% accurate  Bedside Swallowing Assessment General Date of Onset: 01/24/20 Previous Swallow Assessment: none found in chart Diet Prior to this Study: Regular;Thin liquids Temperature Spikes Noted: N/A Respiratory Status: Room air History of Recent Intubation: Yes(procedure only) Length of Intubations (days): 1 days Date extubated: 01/21/20 Behavior/Cognition: Cooperative;Pleasant mood;Confused;Lethargic/Drowsy Oral Cavity - Dentition: Adequate natural dentition Self-Feeding Abilities: Able to feed self;Needs set up Patient Positioning: Upright in bed Baseline Vocal Quality: Low vocal intensity Volitional Cough: Weak Volitional Swallow: Able to elicit  Oral Care Assessment Does patient have any of the following "high(er) risk" factors?: None of the above Does patient have any of the following "at risk" factors?: None  of the above Patient is LOW RISK: Follow universal precautions (see row information) Ice Chips Ice chips: Not tested Thin Liquid Thin Liquid: Impaired Presentation: Cup;Straw;Self Fed Oral Phase Impairments: Reduced labial seal Oral Phase Functional Implications: Right anterior spillage Pharyngeal  Phase Impairments: Multiple swallows;Cough - Immediate;Cough - Delayed Nectar Thick Nectar Thick Liquid: Not tested Honey Thick Honey Thick Liquid: Not tested Puree Puree: Within functional limits Presentation: Self Fed;Spoon Solid Solid: Within functional limits Presentation: Spoon;Self Fed BSE Assessment Risk for Aspiration Impact on safety and function: Mild aspiration risk Other Related Risk Factors: Previous CVA;Cognitive impairment;Lethargy  Short Term Goals: Week 1: SLP Short Term Goal 1 (Week 1): Pt will consume current diet with minimal overt s/sx aspiration and efficient mastication and oral clearance with Min A cues for use of swallow strategies. SLP Short Term Goal 2 (Week 1): Pt will verbally recall his swallow precaution (no straws)   with Min A verbal cues. SLP Short Term Goal 3 (Week 1): Pt will demonstrate ability to problem solve during basic, functional familiar tasks wtih Min A verbal/visual cues. SLP Short Term Goal 4 (Week 1): Pt will demonstrate emergent awareness by detecting errors within tasks wtih Min A verbal/visual cues.  Refer to Care Plan for Long Term Goals  Recommendations for other services: None   Discharge Criteria: Patient will be discharged from SLP if patient refuses treatment 3 consecutive times without medical reason, if treatment goals not met, if there is a change in medical status, if patient makes no progress towards goals or if patient is discharged from hospital.  The above assessment, treatment plan, treatment alternatives and goals were discussed and mutually agreed upon: by patient   E  01/25/2020, 9:58 AM  

## 2020-01-25 NOTE — Care Management (Signed)
Inpatient Rehabilitation Center Individual Statement of Services  Patient Name:  Joshua Shannon  Date:  01/25/2020  Welcome to the Inpatient Rehabilitation Center.  Our goal is to provide you with an individualized program based on your diagnosis and situation, designed to meet your specific needs.  With this comprehensive rehabilitation program, you will be expected to participate in at least 3 hours of rehabilitation therapies Monday-Friday, with modified therapy programming on the weekends.  Your rehabilitation program will include the following services:  Physical Therapy (PT), Occupational Therapy (OT), Speech Therapy (ST), 24 hour per day rehabilitation nursing, Neuropsychology, Case Management (Social Worker), Rehabilitation Medicine, Nutrition Services and Pharmacy Services  Weekly team conferences will be held on Wednesdays to discuss your progress.  Your Social Worker will talk with you frequently to get your input and to update you on team discussions.  Team conferences with you and your family in attendance may also be held.  Expected length of stay: 2-3 weeks   Overall anticipated outcome: CGA overall for transfers, ambulation in a home environment-50' and Minimal assistance for car transfers. Supervision for orientation, daily situations, problem solving and dietary intake.  Depending on your progress and recovery, your program may change. Your Social Worker will coordinate services and will keep you informed of any changes. Your Social Worker's name and contact numbers are listed  below.  The following services may also be recommended but are not provided by the Inpatient Rehabilitation Center:    Home Health Rehabiltiation Services  Outpatient Rehabilitation Services    Arrangements will be made to provide these services after discharge if needed.  Arrangements include referral to agencies that provide these services.  Your insurance has been verified to be:  Medicare A+B/BCBS  PPO Your primary doctor is:  Dr. Windle Guard  Pertinent information will be shared with your doctor and your insurance company.  Social Worker:Lucy [place], [place] [phone] or [phone]    Case Manager:   Chana Bode, RN  (O) [phone] or [phone]  Information discussed with and copy given to patient by: Pamelia Hoit, 01/25/2020, 2:39 PM

## 2020-01-25 NOTE — Progress Notes (Signed)
Bilateral lower extremity venous duplex has been completed. Preliminary results can be found in CV Proc through chart review.   01/25/20 8:29 AM Olen Cordial RVT

## 2020-01-25 NOTE — Discharge Instructions (Signed)
Inpatient Rehab Discharge Instructions  CHRISTROPHER GINTZ Discharge date and time: No discharge date for patient encounter.   Activities/Precautions/ Functional Status: Activity: activity as tolerated Diet: regular diet Wound Care: keep wound clean and dry Functional status:  ___ No restrictions     ___ Walk up steps independently ___ 24/7 supervision/assistance   ___ Walk up steps with assistance ___ Intermittent supervision/assistance  ___ Bathe/dress independently ___ Walk with walker     _x__ Bathe/dress with assistance ___ Walk Independently    ___ Shower independently ___ Walk with assistance    ___ Shower with assistance ___ No alcohol     ___ Return to work/school ________  Special Instructions: No driving smoking or alcohol   My questions have been answered and I understand these instructions. I will adhere to these goals and the provided educational materials after my discharge from the hospital.  Patient/Caregiver Signature _______________________________ Date __________  Clinician Signature _______________________________________ Date __________  Please bring this form and your medication list with you to all your follow-up doctor's appointments.

## 2020-01-25 NOTE — Evaluation (Signed)
Physical Therapy Assessment and Plan  Patient Details  Name: Joshua Shannon MRN: [id number] Date of Birth: [birth year]  PT Diagnosis: Abnormal posture, Difficulty walking, Hemiparesis dominant, Hypertonia, Impaired cognition, Muscle weakness and Pain in R hip Rehab Potential: Good ELOS: 2.5-3 weeks   Today's Date: 01/25/2020 PT Individual Time: 0900-1000 PT Individual Time Calculation (min): 60 min     Problem List:  Patient Active Problem List   Diagnosis Date Noted   Right femoral fracture ([place]) 01/24/2020   Closed displaced fracture of right femoral neck ([place]) 01/20/2020   Fall at home, initial encounter 01/20/2020    Past Medical History:  Past Medical History:  Diagnosis Date   Depression    Hemorrhoids    Hyperlipemia    Past Surgical History:  Past Surgical History:  Procedure Laterality Date   ANTERIOR APPROACH HEMI HIP ARTHROPLASTY Right 01/21/2020   Procedure: ANTERIOR APPROACH HEMI HIP ARTHROPLASTY;  Surgeon: Leandrew Koyanagi, MD;  Location: [facility];  Service: Orthopedics;  Laterality: Right;    Assessment & Plan Clinical Impression: Patient is a 79 y.o. 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  Patient currently resides at [facility] assisted living facility.  Presented 01/20/2020 after mechanical fall without loss of consciousness.  Patient states he fell out of his wheelchair with right hip pain.  X-rays and imaging revealed right femoral neck fracture.  Underwent right hip hemiarthroplasty anterior approach 01/21/2020 per Dr.Xu.  Weightbearing as tolerated right lower extremity.  Maintained on Lovenox for DVT prophylaxis.  Acute blood loss anemia 10.7 and monitored.  Therapy evaluation completed and patient was admitted for a comprehensive rehab program..  Patient transferred to CIR on 01/24/2020 .   Patient currently  requires max with mobility secondary to muscle weakness and muscle joint tightness, abnormal tone and decreased coordination, decreased awareness, decreased safety awareness and decreased memory and decreased standing balance, decreased postural control, hemiplegia and decreased balance strategies.  Prior to hospitalization, patient was min with mobility and lived with Alone in a Assisted living home.  Home access is   .  Patient will benefit from skilled PT intervention to maximize safe functional mobility, minimize fall risk and decrease caregiver burden for planned discharge home with 24 hour assist.  Anticipate patient will benefit from follow up [facility] at discharge.  PT - End of Session Activity Tolerance: Tolerates 30+ min activity with multiple rests Endurance Deficit: Yes PT Assessment Rehab Potential (ACUTE/IP ONLY): Good PT Barriers to Discharge: Behavior;Weight bearing restrictions;Other (comments) PT Barriers to Discharge Comments: confusion(R LE tone/pain) PT Patient demonstrates impairments in the following area(s): Balance;Behavior;Edema;Endurance;Motor;Pain;Perception;Safety;Sensory;Skin Integrity;Nutrition PT Transfers Functional Problem(s): Bed Mobility;Bed to Chair;Car;Furniture PT Locomotion Functional Problem(s): Ambulation;Wheelchair Mobility;Stairs PT Plan PT Intensity: Minimum of 1-2 x/day ,45 to 90 minutes PT Frequency: 5 out of 7 days PT Duration Estimated Length of Stay: 2.5-3 weeks PT Treatment/Interventions: Ambulation/gait training;DME/adaptive equipment instruction;Neuromuscular re-education;Stair training;UE/LE Strength taining/ROM;Psychosocial support;Wheelchair propulsion/positioning;Balance/vestibular training;Discharge planning;Pain management;Skin care/wound management;Therapeutic Activities;UE/LE Coordination activities;Cognitive remediation/compensation;Disease management/prevention;Functional mobility training;Patient/family  education;Splinting/orthotics;Therapeutic Exercise;Visual/perceptual remediation/compensation PT Transfers Anticipated Outcome(s): CGA PT Locomotion Anticipated Outcome(s): CGA PT Recommendation Follow Up Recommendations: Home health PT Patient destination: Assisted Living Equipment Recommended: To be determined Equipment Details: pt has cane and w/c  Skilled Therapeutic Intervention Evaluation completed (see details above and below) with education on PT POC and goals and individual treatment initiated with focus on functional mobility, safety, transfers  and sit<>stands. Pt supine in bed upon PT arrival, agreeable to therapy tx and reports pain 8/10 in R hip. Pt noted to have increased R hamstring tone with movements, causing increased R knee flexion and limited knee extension. Pt transferred to sitting EOB this session with mod assist to help bring R LE off bed and elevate trunk, in sitting pt able to maintain static balance with supervision. Pt performed sit<>stand within stedy this session with mod assist, assist for R LE placement as hamstring tone kicks in. Dependent transfer to the w/c with stedy this session. Pt requesting to wash hands at sink, therapist helped with set up. Pt brushed hair while sitting at the sink, set up assist. Pt performed sit<>stand this session from w/c with max assist from therapist to boost up to standing, able to maintain static standing mod assist with L UE support on therapist's shoulder, cues for upright posture and noted to again have increased R LE flexion/tone, requiring second person to help with R LE placement. Pt propelled w/c this session x 150 ft using L UE/L LE to propel, cues for techniques and min assist needed for steering. Pt seated in w/c at the sink, requesting to brush his teeth, set up assist to complete task. Pt with increased confusion towards end of session, reports this is "all a sharade" and thinks therapist is someone else. Pt left in w/c with  needs in reach and chair alarm set.   PT Evaluation Precautions/Restrictions Precautions Precautions: Fall Precaution Comments: residual R sided weakness from previous CVA in May of 2020 Restrictions Weight Bearing Restrictions: Yes RLE Weight Bearing: Weight bearing as tolerated Other Position/Activity Restrictions: WBAT RLE Home Living/Prior Functioning Home Living Available Help at Discharge: Other (Comment)([facility] at d/c is goal; staff available prn) Type of Home: Assisted living Home Layout: One level Bathroom Shower/Tub: Chiropodist: Standard Bathroom Accessibility: Yes Additional Comments: [facility] since 06/2019  Lives With: Alone Prior Function Level of Independence: Requires assistive device for independence(SPC) Vocation: Retired Comments: gait 500' with SPC previously at assisted living with assistance Cognition Overall Cognitive Status: No family/caregiver present to determine baseline cognitive functioning Arousal/Alertness: Awake/alert Orientation Level: Disoriented to situation;Oriented to person;Oriented to place;Oriented to time Memory: Impaired Memory Impairment: Decreased recall of new information Awareness: Impaired Comments: increased confusion during second half of session, initially A&O x 4 but then saying "this is all a sharade and I am playing your games" towards end of session and calling therapist by a different name Sensation Sensation Light Touch: Impaired by gross assessment(sensation WFL except for dec light touch to R toes) Additional Comments: sensation WFL; except for dec light touch sensation to R toes Coordination Gross Motor Movements are Fluid and Coordinated: No Fine Motor Movements are Fluid and Coordinated: Yes Coordination and Movement Description: gross motor coordination impaired secondary to R hip pain and tightness Motor  Motor Motor: Other (comment) Motor - Skilled Clinical Observations: generalized  weakness, impaired movement on R side secondary to pain and tightness  Mobility Bed Mobility Bed Mobility: Rolling Right;Supine to Sit Rolling Right: Moderate Assistance - Patient 50-74% Supine to Sit: Moderate Assistance - Patient 50-74% Transfers Transfers: Sit to Stand;Stand to Sit;Transfer via [place] to Stand: Maximal Assistance - Patient 25-49% Stand to Sit: Maximal Assistance - Patient 25-49% Transfer via Lift Equipment: Probation officer Ambulation: No Gait Gait: No Stairs / Additional Locomotion Stairs: No Architect: Yes Wheelchair Assistance: Minimal assistance - Patient Building control surveyor:  Left upper extremity;Left lower extremity Wheelchair Parts Management: Needs assistance  Trunk/Postural Assessment  Cervical Assessment Cervical Assessment: Exceptions to [facility] Thoracic Assessment Thoracic Assessment: Exceptions to [facility] Lumbar Assessment Lumbar Assessment: Exceptions to [facility] Postural Control Postural Control: Deficits on evaluation Postural Limitations: forward head, rounded shoulders, kyphotic  Balance Balance Balance Assessed: Yes Static Sitting Balance Static Sitting - Level of Assistance: 5: Stand by assistance Dynamic Sitting Balance Dynamic Sitting - Level of Assistance: 4: Min assist Static Standing Balance Static Standing - Level of Assistance: 3: Mod assist Dynamic Standing Balance Dynamic Standing - Level of Assistance: 1: +1 Total assist;2: Max assist Extremity Assessment  RLE Assessment RLE Assessment: Exceptions to [facility] Passive Range of Motion (PROM) Comments: hip flexion to 90*, limited full knee extension secondary to HS tightness, DF to neutral Active Range of Motion (AROM) Comments: limited d/t pain and weakness, lacking DF/PF may be secondary to hx of CVA (2020) and R hemiparesis General Strength Comments: activates quads unable to assess formally d/t pain, active hip flexion w/assist for  greater ROM LLE Assessment LLE Assessment: Within Functional Limits    Refer to Care Plan for Long Term Goals  Recommendations for other services: None   Discharge Criteria: Patient will be discharged from PT if patient refuses treatment 3 consecutive times without medical reason, if treatment goals not met, if there is a change in medical status, if patient makes no progress towards goals or if patient is discharged from hospital.  The above assessment, treatment plan, treatment alternatives and goals were discussed and mutually agreed upon: No family available/patient unable  Netta Corrigan, PT, DPT, CSRS 01/25/2020, 12:50 PM

## 2020-01-25 NOTE — Progress Notes (Signed)
[facility] PHYSICAL MEDICINE & REHABILITATION PROGRESS NOTE   Subjective/Complaints:  SLP eval in process, pt oriented to person and time but not place and situation, thought he is here for new CVA Per SLP +dysarhtria but no aphasia  Right hip pain last noc  ROS- cpnstipation no abd pain  Or N/V.  No CP or SOB  Objective:   No results found. Recent Labs    01/24/20 0154 01/25/20 0536  WBC 8.9 6.8  HGB 10.7* 10.5*  HCT 32.3* 31.9*  PLT 158 196   Recent Labs    01/23/20 0618  NA 135  K 3.8  CL 101  CO2 24  GLUCOSE 107*  BUN 19  CREATININE 0.98  CALCIUM 8.4*    Intake/Output Summary (Last 24 hours) at 01/25/2020 1245 Last data filed at 01/24/2020 1900 Gross per 24 hour  Intake 120 ml  Output --  Net 120 ml     Physical Exam: Vital Signs Blood pressure 132/62, pulse 83, temperature 97.6 F (36.4 C), temperature source Axillary, resp. rate 18, height 5\' 7"  (1.702 m), weight 61.1 kg, SpO2 93 %.   General: No acute distress Mood and affect are appropriate Heart: Regular rate and rhythm no rubs murmurs or extra sounds Lungs: Clear to auscultation, breathing unlabored, no rales or wheezes Abdomen: Positive bowel sounds, soft nontender to palpation, nondistended Extremities: No clubbing, cyanosis, or edema Skin: No evidence of breakdown, no evidence of rash Neurologic: Cranial nerves II through XII intact, motor strength is 5/5 in LEFT deltoid, bicep, tricep, grip, hip flexor, knee extensors, ankle dorsiflexor and plantar flexor 0/5 RUE, 0/5 LLE in all muscle groups  Tone- MAS 3 in R elbow flexors, mAS 4 in RIght finger flexors MAS 3 in R Hamstrings and quads  Sensory exam normal sensation to light touch and proprioception in bilateral upper and lower extremities Cerebellar exam normal finger to nose to finger as well as heel to shin in bilateral upper and lower extremities Musculoskeletal: Full range of motion in all 4 extremities. No joint swelling     Assessment/Plan: 1. Functional deficits secondary to RIght hip fracture due to fall in a pt with chronic spastic hemi which require 3+ hours per day of interdisciplinary therapy in a comprehensive inpatient rehab setting.  Physiatrist is providing close team supervision and 24 hour management of active medical problems listed below.  Physiatrist and rehab team continue to assess barriers to discharge/monitor patient progress toward functional and medical goals  Care Tool:  Bathing              Bathing assist       Upper Body Dressing/Undressing Upper body dressing   What is the patient wearing?: Pull over shirt    Upper body assist Assist Level: Moderate Assistance - Patient 50 - 74%    Lower Body Dressing/Undressing Lower body dressing      What is the patient wearing?: Incontinence brief     Lower body assist Assist for lower body dressing: Moderate Assistance - Patient 50 - 74%     Toileting Toileting    Toileting assist Assist for toileting: Maximal Assistance - Patient 25 - 49%     Transfers Chair/bed transfer  Transfers assist     Chair/bed transfer assist level: 2 Helpers     Locomotion Ambulation   Ambulation assist              Walk 10 feet activity   Assist  Walk 50 feet activity   Assist           Walk 150 feet activity   Assist           Walk 10 feet on uneven surface  activity   Assist           Wheelchair     Assist               Wheelchair 50 feet with 2 turns activity    Assist            Wheelchair 150 feet activity     Assist          Blood pressure 132/62, pulse 83, temperature 97.6 F (36.4 C), temperature source Axillary, resp. rate 18, height 5\' 7"  (1.702 m), weight 61.1 kg, SpO2 93 %.    Medical Problem List and Plan: 1.Decreased functional mobilitysecondary to right FNF.S/P righthemiarthroplasty anterior approach 01/21/2020.  Weightbearing as tolerated. 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 Was at w/c level PTA -patient may shower -ELOS/Goals: supervision to min assist. 14-20 days 2. Antithrombotics: -DVT/anticoagulation:Lovenox. Check vascular study -antiplatelet therapy: resume ASA 325mg  daily 3. Pain Management:limit to tylenol, ice pack for now given AMS. 4. Mood:Prozac 40 mf daily -antipsychotic agents: N/A 5. Neuropsych: This patientiscapable of making decisions on hisown behalf. 6. Skin/Wound Care:Routine skin checks 7. Fluids/Electrolytes/Nutrition:Routine in and outs with follow-up chemistries 8. Acute blood loss anemia. Follow-up CBC 9. 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  .Pt received botox injections by Dr. June 2020 [facility] on 11/30/2019.Still has severe RUE and RLE tone -Patientalsoon baclofen 10 mg in the morning 20 mg nightly. -family to bring in resting WHO and day time splint from ALF -was using w/c predominantly PTA 10. BPH. Resume Flomax 0.4 mg daily. 11.Hyperlipidemia. Lipitor 12. Constipation. Laxative assistance 13. Confusion: Likely medication induced. Hydrocodone dc'ed already -would go ahead and hold robaxin too. -low grade temp, WBC normal --- negative  UA, UCX pnd  LOS: 1 days A FACE TO FACE EVALUATION WAS PERFORMED  [facility] 01/25/2020, 6:24 AM

## 2020-01-25 NOTE — Care Management (Signed)
   Patient Details  Name: Joshua Shannon MRN: [id number] Date of Birth: [birth year]  Today's Date: 01/25/2020  Problem List:  Patient Active Problem List   Diagnosis Date Noted  . Right femoral fracture (HCC) 01/24/2020  . Closed displaced fracture of right femoral neck (HCC) 01/20/2020  . Fall at home, initial encounter 01/20/2020   Past Medical History:  Past Medical History:  Diagnosis Date  . Depression   . Hemorrhoids   . Hyperlipemia    Past Surgical History:  Past Surgical History:  Procedure Laterality Date  . ANTERIOR APPROACH HEMI HIP ARTHROPLASTY Right 01/21/2020   Procedure: ANTERIOR APPROACH HEMI HIP ARTHROPLASTY;  Surgeon: Tarry Kos, MD;  Location: [facility] OR;  Service: Orthopedics;  Laterality: Right;   Social History:  reports that he has never smoked. He has never used smokeless tobacco. He reports that he does not use drugs. No history on file for alcohol.  Family / Support Systems Marital Status: Married Patient Roles: Spouse, Parent Spouse/Significant Other: Joshua Shannon [phone] Children: Daughter Joshua Shannon Anticipated Caregiver: ALF staff Caregiver Availability: 24/7  Social History Preferred language: English Religion:      Abuse/Neglect Abuse/Neglect Assessment Can Be Completed: Yes Physical Abuse: Denies Verbal Abuse: Denies Sexual Abuse: Denies Exploitation of patient/patient's resources: Denies Self-Neglect: Denies  Emotional Status Pt's affect, behavior and adjustment status: Normal affect, pleasantly confused, cooperative Psychiatric History: Depression  Patient / Family Perceptions, Expectations & Goals Pt/Family understanding of illness & functional limitations: Wife appears to have a good understanding of current health status and functional limitations Pt/family expectations/goals: Wife would like for the patient to get back to his pre-admission functional level  [facility]    Discharge Planning Living Arrangements:  Other (Comment)([facility]) Support Systems: Spouse/significant other, Children Type of Residence: Assisted living Care Facility Name: [facility] Does the patient have any problems obtaining your medications?: No Home Management: Staff will manage the home area Patient/Family Preliminary Plans: Discharge back to [facility] Barriers to Discharge: Decreased caregiver support Social Work Anticipated Follow Up Needs: ALF/IL DC Planning Additional Notes/Comments: Wife placed hold on his previous room at [facility]  Clinical Impression Pleasantly confused gentleman noted he was tired after his therapy session. Wife in room, appreciative of opportunity to be with the patient. This is the first place she has been allowed into. [facility] would only allow for facetime and visits on a porch with social distancing between the patient and family member. She noted she has never been inside even though patient had been there for several months prior to admission. The wife notes an understanding of the plan of care and hopes the patient will get back to the level of care he was before the most recent fall/hip fracture.   Joshua Shannon B 01/25/2020, 2:57 PM

## 2020-01-25 NOTE — Progress Notes (Signed)
Inpatient Rehabilitation  Patient information reviewed and entered into eRehab system by Janera Peugh M. Deven Furia, M.A., CCC/SLP, PPS Coordinator.  Information including medical coding, functional ability and quality indicators will be reviewed and updated through discharge.    

## 2020-01-26 ENCOUNTER — Inpatient Hospital Stay (HOSPITAL_COMMUNITY): Payer: Medicare Other

## 2020-01-26 ENCOUNTER — Inpatient Hospital Stay (HOSPITAL_COMMUNITY): Payer: Medicare Other | Admitting: Speech Pathology

## 2020-01-26 ENCOUNTER — Inpatient Hospital Stay (HOSPITAL_COMMUNITY): Payer: Medicare Other | Admitting: Occupational Therapy

## 2020-01-26 MED ORDER — BACLOFEN 10 MG PO TABS
10.0000 mg | ORAL_TABLET | Freq: Two times a day (BID) | ORAL | Status: DC
  Administered 2020-01-26 – 2020-01-30 (×9): 10 mg via ORAL
  Filled 2020-01-26 (×9): qty 1

## 2020-01-26 MED ORDER — TRAMADOL-ACETAMINOPHEN 37.5-325 MG PO TABS
1.0000 | ORAL_TABLET | ORAL | Status: DC | PRN
  Administered 2020-01-26 – 2020-02-11 (×20): 1 via ORAL
  Filled 2020-01-26 (×22): qty 1

## 2020-01-26 NOTE — Progress Notes (Signed)
Occupational Therapy Session Note  Patient Details  Name: Joshua Shannon MRN: [id number] Date of Birth: [birth year]  Today's Date: 01/26/2020 OT Individual Time: 1130-1200 OT Individual Time Calculation (min): 30 min    Short Term Goals: Week 1:  OT Short Term Goal 1 (Week 1): Pt will complete LB bathing sit to stand with mod assist for 3 consecutive sessions. OT Short Term Goal 2 (Week 1): Pt will complete LB dressing sit to stand with mod assist for 3 consecutive sessions. OT Short Term Goal 3 (Week 1): Pt will complete toilet transfer stand pivot with quadcane and mod assist. OT Short Term Goal 4 (Week 1): Pt will complete toilet hygiene and clothing management with mod assist sit to stand.  Skilled Therapeutic Interventions/Progress Updates:    1:1. Pt received in w/c agreeable to OT. Pt completes grooming seated at sink with RUE held in WB position with mod VC for termination. Pt completes 1 sit to stand at high to low table with OT facilitating WB through RLE attempting to come off floor d/t tone. Pt completes SBT with MIN A for facilitation of RLE and scooting laterally and VC for hand placement. Exited session with pt setad in w/c, exit alarm on and call light in reach  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 Restrictions Weight Bearing Restrictions: No RLE Weight Bearing: Weight bearing as tolerated Other Position/Activity Restrictions: WBAT RLE General:   Vital Signs:  Pain: Pain Assessment Pain Scale: Faces Faces Pain Scale: Hurts a little bit Pain Type: Surgical pain Pain Location: Hip Pain Orientation: Right Pain Descriptors / Indicators: Discomfort Pain Onset: With Activity Pain Intervention(s): Repositioned;Emotional support ADL: ADL Eating: Minimal assistance Where Assessed-Eating: Wheelchair Grooming: Minimal assistance Where Assessed-Grooming:  Wheelchair Upper Body Bathing: Minimal assistance Where Assessed-Upper Body Bathing: Wheelchair Lower Body Bathing: Maximal assistance Where Assessed-Lower Body Bathing: Wheelchair Upper Body Dressing: Supervision/safety Where Assessed-Upper Body Dressing: Wheelchair Lower Body Dressing: Maximal assistance Where Assessed-Lower Body Dressing: Wheelchair Toileting: Maximal assistance Toilet Transfer: Dependent(Stedy) Acupuncturist: Tourist information centre manager    Praxis   Exercises:   Other Treatments:     Therapy/Group: Individual Therapy  [name] 01/26/2020, 12:01 PM

## 2020-01-26 NOTE — Progress Notes (Signed)
Speech Language Pathology Daily Session Note  Patient Details  Name: MAHAD NEWSTROM MRN: [id number] Date of Birth: [birth year]  Today's Date: 01/26/2020 SLP Individual Time: 1245-1340 SLP Individual Time Calculation (min): 55 min  Short Term Goals: Week 1: SLP Short Term Goal 1 (Week 1): Pt will consume current diet with minimal overt s/sx aspiration and efficient mastication and oral clearance with Min A cues for use of swallow strategies. SLP Short Term Goal 2 (Week 1): Pt will verbally recall his swallow precaution (no straws) with Min A verbal cues. SLP Short Term Goal 3 (Week 1): Pt will demonstrate ability to problem solve during basic, functional familiar tasks wtih Min A verbal/visual cues. SLP Short Term Goal 4 (Week 1): Pt will demonstrate emergent awareness by detecting errors within tasks wtih Min A verbal/visual cues. SLP Short Term Goal 5 (Week 1): Pt will utilize external memory aids to recall new, daily information with Mod A verbal cues.  Skilled Therapeutic Interventions: Skilled treatment session focused on dysphagia and cognitive goals. SLP facilitated session by providing skilled observation with lunch meal of regular textures with thin liquids. Patient consumed meal with extra time but did not demonstrate any overt s/s of aspiration, therefore, recommend patient continue current diet.  Patient requested to use the bathroom and required Mod A verbal cues to recall safe transfer (stedy, commode, etc). Patient was able to successfully void but unable to have a bowel movement, RN aware.  Patient demonstrated appropriate awareness in regards to decreased memory and how his impaired memory frustrates him at times, therefore, SLP initiated a memory notebook to maximize recall of daily information. Patient left upright in wheelchair with alarm on and all needs within reach. Continue with current plan of care.      Pain No/Denies Pain   Therapy/Group: Individual Therapy  Jensine Luz,  Liem Copenhaver 01/26/2020, 2:32 PM

## 2020-01-26 NOTE — Progress Notes (Signed)
Team Conference Report to Patient/Family  Team Conference discussion was reviewed with the wife, including goals, any changes in plan of care and target discharge date.  Wife expressed understanding and is  in agreement.  The patient has a target discharge date of 02/11/20. At a minimal assist overall level. She will check with the ALF to see if they will be able to accept the patient back at that level of function when discharged.   Chana Bode B 01/26/2020, 5:29 PM

## 2020-01-26 NOTE — Progress Notes (Signed)
[place] PHYSICAL MEDICINE & REHABILITATION PROGRESS NOTE   Subjective/Complaints:  Right thigh pain interfered with sleep last noc   ROS-  no abd pain  Or N/V.  No CP or SOB  Objective:   VAS [place] LOWER EXTREMITY VENOUS (DVT)  Result Date: 01/25/2020  Lower Venous Study Indications: Swelling.  Risk Factors: None identified. Comparison Study: No prior studies. Performing Technologist: Chanda Busing RVT  Examination Guidelines: A complete evaluation includes B-mode imaging, spectral Doppler, color Doppler, and power Doppler as needed of all accessible portions of each vessel. Bilateral testing is considered an integral part of a complete examination. Limited examinations for reoccurring indications may be performed as noted.  +---------+---------------+---------+-----------+----------+--------------+ RIGHT    CompressibilityPhasicitySpontaneityPropertiesThrombus Aging +---------+---------------+---------+-----------+----------+--------------+ CFV      Full           Yes      Yes                                 +---------+---------------+---------+-----------+----------+--------------+ SFJ      Full                                                        +---------+---------------+---------+-----------+----------+--------------+ FV Prox  Full                                                        +---------+---------------+---------+-----------+----------+--------------+ FV Mid   Full                                                        +---------+---------------+---------+-----------+----------+--------------+ FV DistalFull                                                        +---------+---------------+---------+-----------+----------+--------------+ PFV      Full                                                        +---------+---------------+---------+-----------+----------+--------------+ POP      Full           Yes      Yes                                  +---------+---------------+---------+-----------+----------+--------------+ PTV      Full                                                        +---------+---------------+---------+-----------+----------+--------------+  PERO     Full                                                        +---------+---------------+---------+-----------+----------+--------------+   +---------+---------------+---------+-----------+----------+--------------+ LEFT     CompressibilityPhasicitySpontaneityPropertiesThrombus Aging +---------+---------------+---------+-----------+----------+--------------+ CFV      Full           Yes      Yes                                 +---------+---------------+---------+-----------+----------+--------------+ SFJ      Full                                                        +---------+---------------+---------+-----------+----------+--------------+ FV Prox  Full                                                        +---------+---------------+---------+-----------+----------+--------------+ FV Mid   Full                                                        +---------+---------------+---------+-----------+----------+--------------+ FV DistalFull                                                        +---------+---------------+---------+-----------+----------+--------------+ PFV      Full                                                        +---------+---------------+---------+-----------+----------+--------------+ POP      Full           Yes      Yes                                 +---------+---------------+---------+-----------+----------+--------------+ PTV      Full                                                        +---------+---------------+---------+-----------+----------+--------------+ PERO     Full                                                         +---------+---------------+---------+-----------+----------+--------------+  Summary: Right: There is no evidence of deep vein thrombosis in the lower extremity. No cystic structure found in the popliteal fossa. Left: There is no evidence of deep vein thrombosis in the lower extremity. No cystic structure found in the popliteal fossa.  *See table(s) above for measurements and observations. Electronically signed by Coral Else MD on 01/25/2020 at 3:20:54 PM.    Final    Recent Labs    01/24/20 0154 01/25/20 0536  WBC 8.9 6.8  HGB 10.7* 10.5*  HCT 32.3* 31.9*  PLT 158 196   Recent Labs    01/25/20 0536  NA 139  K 3.4*  CL 105  CO2 24  GLUCOSE 107*  BUN 19  CREATININE 0.93  CALCIUM 8.4*    Intake/Output Summary (Last 24 hours) at 01/26/2020 0831 Last data filed at 01/26/2020 0445 Gross per 24 hour  Intake 120 ml  Output 525 ml  Net -405 ml     Physical Exam: Vital Signs Blood pressure (!) 161/78, pulse 80, temperature 97.7 F (36.5 C), temperature source Oral, resp. rate 18, height 5\' 7"  (1.702 m), weight 61.1 kg, SpO2 99 %.   General: No acute distress Mood and affect are appropriate Heart: Regular rate and rhythm no rubs murmurs or extra sounds Lungs: Clear to auscultation, breathing unlabored, no rales or wheezes Abdomen: Positive bowel sounds, soft nontender to palpation, nondistended Extremities: No clubbing, cyanosis, or edema Skin: No evidence of breakdown, no evidence of rash Neurologic: Cranial nerves II through XII intact, motor strength is 5/5 in LEFT deltoid, bicep, tricep, grip, hip flexor, knee extensors, ankle dorsiflexor and plantar flexor 0/5 RUE, 0/5 LLE in all muscle groups  Tone- MAS 3 in R elbow flexors, mAS 4 in RIght finger flexors MAS 3 in R Hamstrings and quads  Sensory exam normal sensation to light touch and proprioception in bilateral upper and lower extremities Cerebellar exam normal finger to nose to finger as well as heel to shin in  bilateral upper and lower extremities Musculoskeletal: Full range of motion in all 4 extremities. No joint swelling    Assessment/Plan: 1. Functional deficits secondary to RIght hip fracture due to fall in a pt with chronic spastic hemi which require 3+ hours per day of interdisciplinary therapy in a comprehensive inpatient rehab setting.  Physiatrist is providing close team supervision and 24 hour management of active medical problems listed below.  Physiatrist and rehab team continue to assess barriers to discharge/monitor patient progress toward functional and medical goals  Care Tool:  Bathing    Body parts bathed by patient: Right arm, Chest, Abdomen, Front perineal area, Right upper leg, Left upper leg, Left lower leg, Face   Body parts bathed by helper: Left arm, Right lower leg, Buttocks     Bathing assist Assist Level: Maximal Assistance - Patient 24 - 49%     Upper Body Dressing/Undressing Upper body dressing   What is the patient wearing?: Pull over shirt    Upper body assist Assist Level: Supervision/Verbal cueing    Lower Body Dressing/Undressing Lower body dressing      What is the patient wearing?: Incontinence brief, Pants     Lower body assist Assist for lower body dressing: Maximal Assistance - Patient 25 - 49%     Toileting Toileting    Toileting assist Assist for toileting: Maximal Assistance - Patient 25 - 49%     Transfers Chair/bed transfer  Transfers assist     Chair/bed transfer assist level: Dependent - mechanical lift(steady)  Locomotion Ambulation   Ambulation assist   Ambulation activity did not occur: Safety/medical concerns          Walk 10 feet activity   Assist  Walk 10 feet activity did not occur: Safety/medical concerns        Walk 50 feet activity   Assist Walk 50 feet with 2 turns activity did not occur: Safety/medical concerns         Walk 150 feet activity   Assist Walk 150 feet activity  did not occur: Safety/medical concerns         Walk 10 feet on uneven surface  activity   Assist Walk 10 feet on uneven surfaces activity did not occur: Safety/medical concerns         Wheelchair     Assist Will patient use wheelchair at discharge?: Yes Type of Wheelchair: Manual    Wheelchair assist level: Minimal Assistance - Patient > 75% Max wheelchair distance: 150'    Wheelchair 50 feet with 2 turns activity    Assist        Assist Level: Minimal Assistance - Patient > 75%   Wheelchair 150 feet activity     Assist      Assist Level: Minimal Assistance - Patient > 75%   Blood pressure (!) 161/78, pulse 80, temperature 97.7 F (36.5 C), temperature source Oral, resp. rate 18, height 5\' 7"  (1.702 m), weight 61.1 kg, SpO2 99 %.    Medical Problem List and Plan: 1.Decreased functional mobilitysecondary to right FNF.S/P righthemiarthroplasty anterior approach 01/21/2020. Weightbearing as tolerated. 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 Was at w/c level PTA -patient may shower -ELOS/Goals: supervision to min assist. 14-20 days 2. Antithrombotics: -DVT/anticoagulation:Lovenox. Negative vascular study -antiplatelet therapy: resume ASA 325mg  daily 3. Pain Management:limit to tylenol, ice pack for now given AMS. 4. Mood:Prozac 40 mf daily -antipsychotic agents: N/A 5. Neuropsych: This patientiscapable of making decisions on hisown behalf. 6. Skin/Wound Care:Routine skin checks- d/c post op dressing in ~2d for skin check  7. Fluids/Electrolytes/Nutrition:Routine in and outs with follow-up chemistries 8. Acute blood loss anemia. Follow-up CBC 9. 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  .Pt received botox injections by Dr. Dimas [facility] on 11/30/2019.Still has severe RUE and RLE  tone -Patientalsoon baclofen 10 mg in the morning 20 mg nightly. -family to bring in resting WHO and day time splint from ALF -was using w/c predominantly PTA 10. BPH. Resume Flomax 0.4 mg daily. 11.Hyperlipidemia. Lipitor 12. Constipation. Laxative assistance 13. Confusion: Likely medication induced. Hydrocodone dc'ed already -would go ahead and hold robaxin too. -low grade temp, WBC normal --- negative  UA, UCX neg thus far   LOS: 2 days A FACE TO FACE EVALUATION WAS PERFORMED  Charlett Blake 01/26/2020, 8:31 AM

## 2020-01-26 NOTE — Progress Notes (Signed)
Occupational Therapy Session Note  Patient Details  Name: Joshua Shannon MRN: [id number] Date of Birth: 28-Jun-1941  Today's Date: 01/26/2020 OT Individual Time: 8786-7672 OT Individual Time Calculation (min): 76 min    Short Term Goals: Week 1:  OT Short Term Goal 1 (Week 1): Pt will complete LB bathing sit to stand with mod assist for 3 consecutive sessions. OT Short Term Goal 2 (Week 1): Pt will complete LB dressing sit to stand with mod assist for 3 consecutive sessions. OT Short Term Goal 3 (Week 1): Pt will complete toilet transfer stand pivot with quadcane and mod assist. OT Short Term Goal 4 (Week 1): Pt will complete toilet hygiene and clothing management with mod assist sit to stand.  Skilled Therapeutic Interventions/Progress Updates:    Pt worked on shower and dressing during session.  He was pleasant and able to transfer from supine to the EOB with mod assist on the right side.  He was able to then complete transfer from the EOB to the shower bench with use of the Stedy and mod assist for sit to stand.  Once in the shower he undressed with mod assist including brief and then worked on bathing.  He was able to complete UB bathing with min assist, max assist for LB bathing sit to stand secondary to increased tone in the RLE, pulling off of the floor.  He transferred stand pivot from the shower bench to the wheelchair with max assist.  He was able to donn a pullover shirt with increased time, but needed cueing to remember to begin with the RUE first.  He was able to thread his pants and his brief with min assist but needed max assist for standing to pull the items over his hips.  Total assist was needed for donning his TEDs and then mod assist for donning his shoes.  Finished session with stretching of the right digit flexors and placement of padded resting hand splint, which pt reports wearing during the day PTA.  Half lap tray also provided for RUE support.  Finished session with pt in the  wheelchair with call button and phone in reach with safety belt in place.     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 Restrictions Weight Bearing Restrictions: No RLE Weight Bearing: Weight bearing as tolerated Other Position/Activity Restrictions: WBAT RLE  Pain: Pain Assessment Pain Scale: Faces Pain Score: 5  Faces Pain Scale: Hurts a little bit Pain Type: Surgical pain Pain Location: Hip Pain Orientation: Right Pain Descriptors / Indicators: Discomfort Pain Frequency: Intermittent Pain Onset: With Activity Patients Stated Pain Goal: 2 Pain Intervention(s): Repositioned;Emotional support ADL: See Care Tool Section for some details of mobility and selfcare  Therapy/Group: Individual Therapy  Haldon Carley OTR/L 01/26/2020, 10:46 AM

## 2020-01-26 NOTE — Patient Care Conference (Signed)
Inpatient RehabilitationTeam Conference and Plan of Care Update Date: 01/26/2020   Time: 3:54 PM    Patient Name: Joshua Shannon      Medical Record Number: [id number]  Date of Birth: Feb 20, 1941 Sex: Male         Room/Bed: 4W13C/4W13C-01 Payor Info: Payor: MEDICARE / Plan: MEDICARE PART A AND B / Product Type: *No Product type* /    Admit Date/Time:  01/24/2020  4:44 PM  Primary Diagnosis:  Right femoral fracture [facility])  Patient Active Problem List   Diagnosis Date Noted  . Right femoral fracture (HCC) 01/24/2020  . Closed displaced fracture of right femoral neck (HCC) 01/20/2020  . Fall at home, initial encounter 01/20/2020    Expected Discharge Date: Expected Discharge Date: 02/11/20  Team Members Present: Physician leading conference: Dr. Claudette Laws Social Worker Present: Amada Jupiter, LCSW Nurse Present: Chana Bode, RN;Other (comment)(Blair Montanti, LPN) Case Manager: Roderic Palau, RN PT Present: Woodfin Ganja, PT OT Present: Perrin Maltese, OT SLP Present: Suzzette Righter, CF-SLP PPS Coordinator present : Edson Snowball, Park Breed, SLP     Current Status/Progress Goal Weekly Team Focus  Bowel/Bladder   continent with incontinent episodes, LBM 01/23/20  less incontinent episodes  timed toileting   Swallow/Nutrition/ Hydration   Reg/thin, no straws Min A slow rate, small sips  Supervision A  carryover of swallow strategies, ensure no use of straws, tolerance reg/thin via cup   ADL's   Pt completes UB selfcare with min assist, max assist for LB selfcare, max assist for transfers as well.  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     Barriers to Discharge  Current Status/Progress Possible Resolutions Date Resolved   Nursing  Weight bearing restrictions;Incontinence               PT  Behavior;Weight bearing restrictions;Other (comments)  confusion(R LE tone/pain)              OT                  SLP                SW Decreased caregiver support;Lack of/limited family support Plan is to return to [facility] Bed is on hold at ALF          Discharge Planning/Teaching Needs:  Plan to discharge back to [facility]  TBD   Team Discussion: Old CVA, chronic r hemi, botox injections at [facility], family to bring resting  hand splints, started tramadol.  RN confusion, increased tone, inc.  OT min UB B, S UB D, LB B mod, max A sit to stand, wife brought in hand splints, goals min A.  PT CGA goals, tone limiting, min A stand with steady, max A to stand.  SLP baseline deficits, memory deficits, disoriented, coughing, no straws to be used.   Revisions to Treatment Plan: N/A     Medical Summary Current Status: Patient remains aphasic.  Tone related pain. Weekly Focus/Goal: Spasticity management, oral medication adjustment  Barriers to Discharge: Medical stability   Possible Resolutions to Barriers: See above, increase splinting use for the right upper extremity   Continued Need for Acute Rehabilitation  Level of Care: The patient requires daily medical management by a physician with specialized training in physical medicine and rehabilitation for the following reasons: Direction of a multidisciplinary physical rehabilitation program to maximize functional independence : Yes Medical management of patient stability for increased activity during participation in an intensive rehabilitation regime.: Yes Analysis of laboratory values and/or radiology reports with any subsequent need for medication adjustment and/or medical intervention. : Yes   I attest that I was present, lead the team conference, and concur with the assessment and plan of the team.   Jodell Cipro M 01/26/2020, 3:54 PM   Team conference was held via web/ teleconference due to [place] - 19

## 2020-01-26 NOTE — Progress Notes (Signed)
Physical Therapy Session Note  Patient Details  Name: Joshua Shannon MRN: [id number] Date of Birth: [birth year]  Today's Date: 01/26/2020 PT Individual Time: 1500-1557 PT Individual Time Calculation (min): 57 min    Short Term Goals: Week 1:  PT Short Term Goal 1 (Week 1): Pt will initiate gait training PT Short Term Goal 2 (Week 1): Pt will perform bed<>chair transfers with mod assist PT Short Term Goal 3 (Week 1): Pt will perform bed mobility supine<>sit with min assist  Skilled Therapeutic Interventions/Progress Updates:    Pt seated in w/c upon PT arrival, agreeable to therapy tx and reports pain 1/10 in R hip, relieved with rest. Pt transported to the gym in w/c. Pt alert and oriented throughout the session, upon arrival he reports "are you Irving Burton with physical therapy?" referring to his memory notebook with schedule. Session focused on R LE tone management this session- including weightbearing through R LE in standing and seated hamstring stretching. Pt performed slideboard transfer to the mat with CGA, cues for techniques. Pt performed sit<>stand from mat without AD and with mod assist, therapist facilitating R LE weightbearing and increased knee extension in standing, pt able to maintain static standing balance with min-mod assist. Pt reports minimal pain in R hip with standing. Pt able to perform stepping in place with L LE 2 x 10 this session with L LE on quad cane for UE support working on R LE weightbearing, mod assist. Attempted to take a step in place with R LE however adductor and hamstring tone limiting safe foot placement. Pt performed sit>stand with mod assist and then stand pivot towards the L to w/c using L UE to grab arm rest, mod assist, cues for techniques. Lateral scoot back to the mat with min assist, towards the R, cues for techniques. Squat pivot to the w/c using L arm rest with min assist towards the L. Pt ambulated x 10 ft this session with L rail in hallway, min assist, pt  with R hamstring tone causing R knee flexion, therapist assisting with R LE placement during swing. Pt ambulated x 15 ft with QC and min assist, therapist assisting with R LE foot placement, pt noted to have increased hip hike/circumduction to clear R foot and continues with R hamstring tone/knee flexion. OT reports that patient does have an AFO in his room. Pt transported back to room and therapist donned R AFO. Pt ambulated x 10 ft to the bed with mod assist and quad cane, improved foot clearance and minimized hip hike however continues with knee flexion/hamstring tone during stance. Pt transferred to supine with min assist and left supine in bed with needs in reach and bed alarm set.   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

## 2020-01-27 ENCOUNTER — Inpatient Hospital Stay (HOSPITAL_COMMUNITY): Payer: Medicare Other | Admitting: Speech Pathology

## 2020-01-27 ENCOUNTER — Inpatient Hospital Stay (HOSPITAL_COMMUNITY): Payer: Medicare Other | Admitting: Physical Therapy

## 2020-01-27 ENCOUNTER — Inpatient Hospital Stay (HOSPITAL_COMMUNITY): Payer: Medicare Other | Admitting: Occupational Therapy

## 2020-01-27 MED ORDER — POLYETHYLENE GLYCOL 3350 17 G PO PACK
17.0000 g | PACK | Freq: Every day | ORAL | Status: DC
  Administered 2020-01-27 – 2020-02-06 (×10): 17 g via ORAL
  Filled 2020-01-27 (×13): qty 1

## 2020-01-27 NOTE — Progress Notes (Signed)
Occupational Therapy Session Note  Patient Details  Name: Joshua Shannon MRN: [id number] Date of Birth: Feb 23, 1941  Today's Date: 01/27/2020 OT Individual Time: 0838-1000 OT Individual Time Calculation (min): 82 min    Short Term Goals: Week 1:  OT Short Term Goal 1 (Week 1): Pt will complete LB bathing sit to stand with mod assist for 3 consecutive sessions. OT Short Term Goal 2 (Week 1): Pt will complete LB dressing sit to stand with mod assist for 3 consecutive sessions. OT Short Term Goal 3 (Week 1): Pt will complete toilet transfer stand pivot with quadcane and mod assist. OT Short Term Goal 4 (Week 1): Pt will complete toilet hygiene and clothing management with mod assist sit to stand.  Skilled Therapeutic Interventions/Progress Updates:    Pt in bed to start session, completed transfer to the right edge of the bed with mod facilitation and increased time secondary to right hip pain.  On the EOB, worked on donning pants, TEDs, shoes, and right AFO.  He demonstrates increased posterior lean in sitting when attempting to cross the RLE over the left knee, which he was able to complete with min assist.  Min instructional cueing for orientation of pants to make sure and not place the RLE in the left leg opening.  Once donned over his feet, he was able to stand with mod assist to pull the pants over his hips.  Increased flexor tone noted in the right LE but with therapist facilitation it was able to remain on the ground.  Therapist provided total assist for donning TEDs and max assist for donning the right shoe and AFO.  He was able to strap the AFO once his foot was in place. He was able to donn the left shoe with supervision.  Next, he completed transfer stand pivot to the wheelchair without an assistive device and mod assist.  Therapist took him down to the therapy gym where he transferred to the mat at the same level with use of the quad cane.  Mod facilitation for advancement and placement of the  RLE, with pt still demonstrating knee flexion in weightbearing.  Once on the mat, therapist performed stretching to the right  finger flexors and place his hand in a splint to allow for weightbearing.  Therapist also applied NMES in the form of the Saebo Stim go to the digit extensors.  During work on RUE stretching and weightbearing tasks stimulation was active as listed below.  No adverse reactions to stimulation noted with setting placed on level 14.  Had pt transition to supine and sidelying on the left side for scapular mobilizations.  Then had pt assist with scapular adduction on the right as well as depressing.  He demonstrated some active movement.  Transitioned back to sitting with RUE placed in weightbearing and maintained with mod assist from therapist to work on lateral weightshifts to the left and hip hike on the right for reciprical scooting  While attempting to use the RUE to push into the surface and assist.  Mod facilitation needed to maintain hand in contact with the surface and help extend the elbow.  Trace movements inconsistently noted in the elbow extensors.  Also worked on small weightshifts to the right in sitting as tolerated while using the RUE to help maintain balance and return to midline.  Finished session with removal of NMES and donning of resting hand splint.  Mod assist transfer completed back to the wheelchair with use of the quad cane and  return to the room.  Pt left sitting up with RUE supported on half lap tray and safety belt in place.  Call button and phone in reach.    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

## 2020-01-27 NOTE — Progress Notes (Signed)
Speech Language Pathology Daily Session Note  Patient Details  Name: Joshua Shannon MRN: [id number] Date of Birth: 03-23-1941  Today's Date: 01/27/2020 SLP Individual Time: 1059-1200 SLP Individual Time Calculation (min): 61 min  Short Term Goals: Week 1: SLP Short Term Goal 1 (Week 1): Pt will consume current diet with minimal overt s/sx aspiration and efficient mastication and oral clearance with Min A cues for use of swallow strategies. SLP Short Term Goal 2 (Week 1): Pt will verbally recall his swallow precaution (no straws) with Min A verbal cues. SLP Short Term Goal 3 (Week 1): Pt will demonstrate ability to problem solve during basic, functional familiar tasks wtih Min A verbal/visual cues. SLP Short Term Goal 4 (Week 1): Pt will demonstrate emergent awareness by detecting errors within tasks wtih Min A verbal/visual cues. SLP Short Term Goal 5 (Week 1): Pt will utilize external memory aids to recall new, daily information with Mod A verbal cues.  Skilled Therapeutic Interventions: Pt was seen for skilled ST targeting dysphagia and cognitive goals. SLP facilitated session with overall Supervision A verbal cues for sequencing and safety awareness during functional transfers sitting to standing in stedy as well as standing to sitting in wheelchair. Pt with improved overall orientation (OX4), awareness of deficits, and ability to at minimum detect verbal and functional errors during tasks today. Pt also demonstrated recall of earlier OT session (activities X2) as well as his therapist's name. With delayed recall of 30 min pt also recalled this SLP's name at end of session, and pt located memory notebook started in previous ST session with Supervision A question cues. SLP provided handout and verbal explanation of additional compensatory memory strategies with an emphasis on routines and note taking. During skilled observation of pt consuming thin liquids, pt exhibited immediate cough X1, suspect due  to his attempt to speak too quickly after swallowing. Pt did, however, verbalize and use safe swallowing strategies with Supervision A verbal cues. Suspect pt is close to new baseline level of cognitive functioning (post-CVA in May 2020). Will follow up for additional tx session. Pt left sitting in chair with seatbelt alarm in place and needs within reach. Continue per current plan of care.         Pain Pain Assessment Pain Scale: 0-10 Pain Score: 0-No pain  Therapy/Group: Individual Therapy  Joshua Shannon 01/27/2020, 12:17 PM

## 2020-01-27 NOTE — IPOC Note (Signed)
Overall Plan of Care [facility]) Patient Details Name: Joshua Shannon MRN: [id number] DOB: [birth year]  Admitting Diagnosis: Right femoral fracture [facility])  Hospital Problems: Principal Problem:   Right femoral fracture (HCC)     Functional Problem List: Nursing Endurance, Safety  PT Balance, Behavior, Edema, Endurance, Motor, Pain, Perception, Safety, Sensory, Skin Integrity, Nutrition  OT Balance, Pain, Safety, Cognition, Endurance, Motor, Sensory  SLP Cognition, Nutrition  TR         Basic ADL's: OT Grooming, Bathing, Dressing, Toileting     Advanced  ADL's: OT       Transfers: PT Bed Mobility, Bed to Chair, Car, Occupational psychologist, Research scientist (life sciences): PT Ambulation, Psychologist, prison and probation services, Stairs     Additional Impairments: OT Fuctional Use of Upper Extremity  SLP Social Cognition, Swallowing expression Problem Solving, Awareness  TR      Anticipated Outcomes Item Anticipated Outcome  Self Feeding modified independent  Swallowing  Supervision A   Basic self-care  min assist  Toileting  min assist   Bathroom Transfers min assist  Bowel/Bladder  cont x 2  Transfers  CGA  Locomotion  CGA  Communication     Cognition  Supervision A  Pain  less than 3  Safety/Judgment  supervision assist   Therapy Plan: PT Intensity: Minimum of 1-2 x/day ,45 to 90 minutes PT Frequency: 5 out of 7 days PT Duration Estimated Length of Stay: 2.5-3 weeks OT Intensity: Minimum of 1-2 x/day, 45 to 90 minutes OT Frequency: 5 out of 7 days OT Duration/Estimated Length of Stay: 12-14 days SLP Intensity: Minumum of 1-2 x/day, 30 to 90 minutes SLP Frequency: 3 to 5 out of 7 days SLP Duration/Estimated Length of Stay: 2-3 weeks   Due to the current state of emergency, patients may not be receiving their 3-hours of Medicare-mandated therapy.   Team Interventions: Nursing Interventions Patient/Family Education, Discharge Planning, Cognitive Remediation/Compensation  PT  interventions Ambulation/gait training, DME/adaptive equipment instruction, Neuromuscular re-education, Stair training, UE/LE Strength taining/ROM, Psychosocial support, Wheelchair propulsion/positioning, Warden/ranger, Discharge planning, Pain management, Skin care/wound management, Therapeutic Activities, UE/LE Coordination activities, Cognitive remediation/compensation, Disease management/prevention, Functional mobility training, Patient/family education, Splinting/orthotics, Therapeutic Exercise, Visual/perceptual remediation/compensation  OT Interventions Balance/vestibular training, DME/adaptive equipment instruction, Discharge planning, Cognitive remediation/compensation, Community reintegration, Disease mangement/prevention, Functional mobility training, Patient/family education, Therapeutic Exercise, Therapeutic Activities, Self Care/advanced ADL retraining, Functional electrical stimulation, Pain management, UE/LE Coordination activities, Splinting/orthotics, Neuromuscular re-education, UE/LE Strength taining/ROM, Wheelchair propulsion/positioning  SLP Interventions Cognitive remediation/compensation, Dysphagia/aspiration precaution training, Cueing hierarchy, Functional tasks, Patient/family education, Environmental controls  TR Interventions    SW/CM Interventions Discharge Planning, Disease Management/Prevention, Psychosocial Support, Patient/Family Education   Barriers to Discharge MD  Medical stability  Nursing Weight bearing restrictions, Incontinence    PT Behavior, Weight bearing restrictions, Other (comments) confusion(R LE tone/pain)  OT      SLP      SW Decreased caregiver support, Lack of/limited family support Plan is to return to [facility]   Team Discharge Planning: Destination: PT-Assisted Living ,OT- Assisted Living , SLP-Assisted Living Projected Follow-up: PT-Home health PT, OT-  Other (comment)(ALF min assist level), SLP-24 hour supervision/assistance,  Home Health SLP Projected Equipment Needs: PT-To be determined, OT- To be determined, SLP-None recommended by SLP Equipment Details: PT-pt has cane and w/c, OT-  Patient/family involved in discharge planning: PT- Patient unable/family or caregiver not available,  OT-Patient, Family member/caregiver, SLP-Patient  MD ELOS: 14-20 days Medical Rehab Prognosis:  Excellent Assessment: Mr. Guster has been progressing  well with therapy. Goals are S to MinA. Medical issues include pain, spasticity, and sedation. Hydrocodone has been discontinued to decrease sedation and appears to have helped. Tizanidine added 2/4 to help with spasticity, insomnia, and pain which is worst at well. Patient is doing an excellent job working through pain during his therapy sessions.      See Team Conference Notes for weekly updates to the plan of care

## 2020-01-27 NOTE — Progress Notes (Signed)
[name] PHYSICAL MEDICINE & REHABILITATION PROGRESS NOTE   Subjective/Complaints: Joshua Shannon denies pain this morning. He had some right leg pain last night that interfered with his ability to sleep, similar to the previous night. He is unable to describe quality of pain but points to his thigh. Discussed with [name] OT who shares that his pain has not limited his ability to participate in therapy and that patient and his wife were concerned about oversedation from too many medications so his opioid dose has been lowered.  Joshua Shannon reports constipation--he tried twice yesterday to have BM but was unable to. He requests Miralax.   ROS-  no abd pain  Or N/V.  No CP or SOB  Objective:   No results found. Recent Labs    01/25/20 0536  WBC 6.8  HGB 10.5*  HCT 31.9*  PLT 196   Recent Labs    01/25/20 0536  NA 139  K 3.4*  CL 105  CO2 24  GLUCOSE 107*  BUN 19  CREATININE 0.93  CALCIUM 8.4*    Intake/Output Summary (Last 24 hours) at 01/27/2020 0915 Last data filed at 01/27/2020 0733 Gross per 24 hour  Intake 480 ml  Output 350 ml  Net 130 ml     Physical Exam: Vital Signs Blood pressure (!) 166/77, pulse 79, temperature 97.9 F (36.6 C), resp. rate 18, height 5\' 7"  (1.702 m), weight 61.1 kg, SpO2 97 %. General: No acute distress. Lying in bed comfortably.  Mood and affect are appropriate Heart: Regular rate and rhythm no rubs murmurs or extra sounds Lungs: Clear to auscultation, breathing unlabored, no rales or wheezes Abdomen: Positive bowel sounds, soft nontender to palpation, nondistended Extremities: No clubbing, cyanosis, or edema Skin: No evidence of breakdown, no evidence of rash Neurologic: Cranial nerves II through XII intact, motor strength is 5/5 in LEFT deltoid, bicep, tricep, grip, hip flexor, knee extensors, ankle dorsiflexor and plantar flexor 0/5 RUE, 0/5 LLE in all muscle groups  Tone- MAS 3 in R elbow flexors, mAS 4 in RIght finger flexors MAS 3 in  R Hamstrings and quads. Crepitus in right knee with spasticity testing.  Sensory exam normal sensation to light touch and proprioception in bilateral upper and lower extremities Cerebellar exam normal finger to nose to finger as well as heel to shin in bilateral upper and lower extremities Musculoskeletal: Full range of motion in all 4 extremities. No joint swelling    Assessment/Plan: 1. Functional deficits secondary to RIght hip fracture due to fall in a pt with chronic spastic hemi which require 3+ hours per day of interdisciplinary therapy in a comprehensive inpatient rehab setting.  Physiatrist is providing close team supervision and 24 hour management of active medical problems listed below.  Physiatrist and rehab team continue to assess barriers to discharge/monitor patient progress toward functional and medical goals  Care Tool:  Bathing    Body parts bathed by patient: Right arm, Chest, Abdomen, Front perineal area, Right upper leg, Left upper leg, Right lower leg, Left lower leg, Face   Body parts bathed by helper: Left arm, Right lower leg, Buttocks Body parts n/a: Left arm, Buttocks   Bathing assist Assist Level: Moderate Assistance - Patient 50 - 74%     Upper Body Dressing/Undressing Upper body dressing   What is the patient wearing?: Pull over shirt    Upper body assist Assist Level: Maximal Assistance - Patient 25 - 49%    Lower Body Dressing/Undressing Lower body dressing  What is the patient wearing?: Incontinence brief, Pants     Lower body assist Assist for lower body dressing: Maximal Assistance - Patient 25 - 49%     Toileting Toileting    Toileting assist Assist for toileting: Minimal Assistance - Patient > 75%     Transfers Chair/bed transfer  Transfers assist     Chair/bed transfer assist level: Minimal Assistance - Patient > 75%(squat pivot)     Locomotion Ambulation   Ambulation assist   Ambulation activity did not occur:  Safety/medical concerns  Assist level: Moderate Assistance - Patient 50 - 74% Assistive device: Cane-quad Max distance: 10 ft   Walk 10 feet activity   Assist  Walk 10 feet activity did not occur: Safety/medical concerns  Assist level: Moderate Assistance - Patient - 50 - 74% Assistive device: Cane-quad   Walk 50 feet activity   Assist Walk 50 feet with 2 turns activity did not occur: Safety/medical concerns         Walk 150 feet activity   Assist Walk 150 feet activity did not occur: Safety/medical concerns         Walk 10 feet on uneven surface  activity   Assist Walk 10 feet on uneven surfaces activity did not occur: Safety/medical concerns         Wheelchair     Assist Will patient use wheelchair at discharge?: Yes Type of Wheelchair: Manual    Wheelchair assist level: Minimal Assistance - Patient > 75% Max wheelchair distance: 150'    Wheelchair 50 feet with 2 turns activity    Assist        Assist Level: Minimal Assistance - Patient > 75%   Wheelchair 150 feet activity     Assist      Assist Level: Minimal Assistance - Patient > 75%   Blood pressure (!) 166/77, pulse 79, temperature 97.9 F (36.6 C), resp. rate 18, height 5\' 7"  (1.702 m), weight 61.1 kg, SpO2 97 %.    Medical Problem List and Plan: 1.Decreased functional mobilitysecondary to right FNF.S/P righthemiarthroplasty anterior approach 01/21/2020. Weightbearing as tolerated. 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 Was at w/c level PTA -patient may shower -ELOS/Goals: supervision to min assist. 14-20 days  -Continue CIR PT, OT.  2. Antithrombotics: -DVT/anticoagulation:Lovenox. Negative vascular study -antiplatelet therapy: resume ASA 325mg  daily 3. Pain Management:limit to tylenol, ice pack for now given AMS. 4. Mood:Prozac 40 mf daily -antipsychotic agents:  N/A 5. Neuropsych: This patientiscapable of making decisions on hisown behalf. 6. Skin/Wound Care:Routine skin checks- d/c post op dressing in ~2d for skin check  7. Fluids/Electrolytes/Nutrition:Routine in and outs with follow-up chemistries 8. Acute blood loss anemia. Follow-up CBC 9. 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  .Pt received botox injections by Dr. [facility]as [facility] on 11/30/2019.Still has severe RUE and RLE tone -Patientalsoon baclofen 10 mg in the morning 20 mg nightly. -family to bring in resting WHO and day time splint from ALF -was using w/c predominantly PTA  -2/4: Discussed with Jeneen Rinks OT; spasticity is a barrier during therapy. Reviewed medications: patient is receiving Baclofen 10mg , 10mg , 20mg . Patient is Shannon having difficulty sleeping a night due to RLE pain. I do not want to increase dose of Baclofen further to avoid Baclofen toxicity and will instead add Tizanidine 2mg  HS to help with spasticity, pain, and insomnia.  10. BPH. Resume Flomax 0.4 mg daily. 11.Hyperlipidemia. Lipitor 12. Constipation. 2/4: Patient  reports that he attempted to have BM twice yesterday and was unable to. Requests Miralax. He has ordered as PRN but has not received; I will schedule. Next dose this morning.  13. Confusion: Likely medication induced. Hydrocodone dc'ed already -would go ahead and hold robaxin too. -low grade temp, WBC normal --- negative  UA, UCX neg thus far  14. Insomnia: 2/4: Appears to be pain related. Will add Tizanidine 2mg  HS (see above #9 spasticity)  LOS: 3 days A FACE TO FACE EVALUATION WAS PERFORMED  Avrie Kedzierski P Shaima Sardinas 01/27/2020, 9:15 AM

## 2020-01-27 NOTE — Progress Notes (Signed)
Physical Therapy Session Note  Patient Details  Name: Joshua Shannon MRN: [id number] Date of Birth: 08-12-1941  Today's Date: 01/27/2020 PT Individual Time: 1700-1749 PT Individual Time Calculation (min): 57 min   Short Term Goals: Week 1:  PT Short Term Goal 1 (Week 1): Pt will initiate gait training PT Short Term Goal 2 (Week 1): Pt will perform bed<>chair transfers with mod assist PT Short Term Goal 3 (Week 1): Pt will perform bed mobility supine<>sit with min assist  Skilled Therapeutic Interventions/Progress Updates:    Pt received sitting on toilet with nursing staff present and pt attempting to have a BM. Therapist assumed care of patient and pt agreeable to therapy session. Pt required increased time on toilet for voiding - adjusted therapy treatment time to account for this. Sit>stand BSC over toilet>stedy with mod assist for lifting into standing - required min assist for standing balance in stedy with therapist assisting R LE foot placement and performed total assist peri-care. Stedy transfer to w/c - able to stand from stedy chair with min assist and again therapist manually facilitating R foot placement due to hamstring tone.  Transported to/from gym in w/c for time management and energy conservation. Pt wearing R LE GRAFO throughout session. R squat pivot w/c>EOM with mod assist for lifting and pivoting hips, cuing for sequencing. Seated R LE hamstring stretch due to significantly increased tone 2x55minute. Sit<>stands using quad cane in L UE with min/mod assist for lifting/balance throughout session. Performed standing with quad cane R LE weight bearing for tone management via lateral weight shifts while therapist manually facilitated increased R hip/knee extension. Attempted gait~67ft forward/backwards using quad cane but therapist unable to safely assist with R LE placement therefore returned to sitting EOM. Therapist readjusted position by putting pt's R UE support around therapist to  improve assist with balance and pt able to ambulate ~71ft using quad can requiring max manual facilitation for R LE positioning but pt continues to demonstrate significantly impaired gait mechanics with knee flexion and therapist unable to safely manually facilitate correction. Standing with L UE support on quad cane performed R LE NMR via cone kicks targeting increased quad activation with min/mod assist for balance - cuing for increased R hip abduction to improve foot position when bringing foot back after kick. L stand pivot to w/c using quad cane with min assist for balance and manual facilitation for weight shifting. Transported to hallway rail. Gait training 85ft x2 (seated break between) using L UE support on hallway rail with mod/max assist for balance and R LE management with +2 for w/c follow - during first few steps pt's R hamstring tone caused a significantly flexed posture but with increased repetition and therapist providing max assist for R LE position and increased weight bearing able to improve gait mechanics though pt still lacked adequate R/L lateral and forward weight shift onto stance limb as well as lacked adequate trunk/hip extension. Transported back to room and left seated in w/c with needs in reach and seat belt alarm on.  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 Restrictions Weight Bearing Restrictions: No RLE Weight Bearing: Weight bearing as tolerated Other Position/Activity Restrictions: WBAT RLE  Pain: Consistently denies pain throughout session.    Therapy/Group: Individual Therapy  Ginny Forth, PT, DPT 01/27/2020, 12:46 PM

## 2020-01-28 ENCOUNTER — Inpatient Hospital Stay (HOSPITAL_COMMUNITY): Payer: Medicare Other | Admitting: Physical Therapy

## 2020-01-28 ENCOUNTER — Inpatient Hospital Stay (HOSPITAL_COMMUNITY): Payer: Medicare Other | Admitting: Speech Pathology

## 2020-01-28 ENCOUNTER — Inpatient Hospital Stay (HOSPITAL_COMMUNITY): Payer: Medicare Other | Admitting: Occupational Therapy

## 2020-01-28 NOTE — Progress Notes (Signed)
[facility] PHYSICAL MEDICINE & REHABILITATION PROGRESS NOTE   Subjective/Complaints:  Denies hip or thigh pain.    ROS-  no abd pain  Or N/V.  No CP or SOB  Objective:   No results found. No results for input(s): WBC, HGB, HCT, PLT in the last 72 hours. No results for input(s): NA, K, CL, CO2, GLUCOSE, BUN, CREATININE, CALCIUM in the last 72 hours.  Intake/Output Summary (Last 24 hours) at 01/28/2020 0732 Last data filed at 01/28/2020 0259 Gross per 24 hour  Intake 360 ml  Output 200 ml  Net 160 ml     Physical Exam: Vital Signs Blood pressure (!) 165/82, pulse 87, temperature 98 F (36.7 C), resp. rate 16, height 5\' 7"  (1.702 m), weight 61.1 kg, SpO2 98 %. General: No acute distress. Lying in bed comfortably.  Mood and affect are appropriate Heart: Regular rate and rhythm no rubs murmurs or extra sounds Lungs: Clear to auscultation, breathing unlabored, no rales or wheezes Abdomen: Positive bowel sounds, soft nontender to palpation, nondistended Extremities: No clubbing, cyanosis, or edema Skin: No evidence of breakdown, no evidence of rash Neurologic: Cranial nerves II through XII intact, motor strength is 5/5 in LEFT deltoid, bicep, tricep, grip, hip flexor, knee extensors, ankle dorsiflexor and plantar flexor 0/5 RUE, 0/5 LLE in all muscle groups  Tone- MAS 3 in R elbow flexors, mAS 4 in RIght finger flexors MAS 3 in R Hamstrings and quads. Crepitus in right knee with spasticity testing.  Sensory exam normal sensation to light touch and proprioception in bilateral upper and lower extremities Cerebellar exam normal finger to nose to finger as well as heel to shin in bilateral upper and lower extremities Musculoskeletal: Full range of motion in all 4 extremities. No joint swelling    Assessment/Plan: 1. Functional deficits secondary to RIght hip fracture due to fall in a pt with chronic spastic hemi which require 3+ hours per day of interdisciplinary therapy in a  comprehensive inpatient rehab setting.  Physiatrist is providing close team supervision and 24 hour management of active medical problems listed below.  Physiatrist and rehab team continue to assess barriers to discharge/monitor patient progress toward functional and medical goals  Care Tool:  Bathing    Body parts bathed by patient: Right arm, Chest, Abdomen, Front perineal area, Right upper leg, Left upper leg, Right lower leg, Left lower leg, Face   Body parts bathed by helper: Left arm, Right lower leg, Buttocks Body parts n/a: Left arm, Buttocks   Bathing assist Assist Level: Moderate Assistance - Patient 50 - 74%     Upper Body Dressing/Undressing Upper body dressing   What is the patient wearing?: Pull over shirt    Upper body assist Assist Level: Maximal Assistance - Patient 25 - 49%    Lower Body Dressing/Undressing Lower body dressing      What is the patient wearing?: Pants     Lower body assist Assist for lower body dressing: Moderate Assistance - Patient 50 - 74%     Toileting Toileting    Toileting assist Assist for toileting: Minimal Assistance - Patient > 75%     Transfers Chair/bed transfer  Transfers assist     Chair/bed transfer assist level: Moderate Assistance - Patient 50 - 74%     Locomotion Ambulation   Ambulation assist   Ambulation activity did not occur: Safety/medical concerns  Assist level: 2 helpers(max A of 1 and +2 w/c follow) Assistive device: Other (comment)(hallway rail) Max distance: 30ft  Walk 10 feet activity   Assist  Walk 10 feet activity did not occur: Safety/medical concerns  Assist level: 2 helpers(max A of 1 and +2 w/c follow) Assistive device: Other (comment)(hallway rail)   Walk 50 feet activity   Assist Walk 50 feet with 2 turns activity did not occur: Safety/medical concerns         Walk 150 feet activity   Assist Walk 150 feet activity did not occur: Safety/medical concerns          Walk 10 feet on uneven surface  activity   Assist Walk 10 feet on uneven surfaces activity did not occur: Safety/medical concerns         Wheelchair     Assist Will patient use wheelchair at discharge?: Yes Type of Wheelchair: Manual    Wheelchair assist level: Minimal Assistance - Patient > 75% Max wheelchair distance: 150'    Wheelchair 50 feet with 2 turns activity    Assist        Assist Level: Minimal Assistance - Patient > 75%   Wheelchair 150 feet activity     Assist      Assist Level: Minimal Assistance - Patient > 75%   Blood pressure (!) 165/82, pulse 87, temperature 98 F (36.7 C), resp. rate 16, height 5\' 7"  (1.702 m), weight 61.1 kg, SpO2 98 %.    Medical Problem List and Plan: 1.Decreased functional mobilitysecondary to right FNF.S/P righthemiarthroplasty anterior approach 01/21/2020. Weightbearing as tolerated. 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 Was at w/c level PTA -patient may shower -ELOS/Goals: supervision to min assist. 14-20 days  -Continue CIR PT, OT.  2. Antithrombotics: -DVT/anticoagulation:Lovenox. Negative vascular study -antiplatelet therapy: resume ASA 325mg  daily 3. Pain Management:limit to tylenol, ice pack for now given AMS. 4. Mood:Prozac 40 mf daily -antipsychotic agents: N/A 5. Neuropsych: This patientiscapable of making decisions on hisown behalf. 6. Skin/Wound Care:Routine skin checks- d/c post op dressing , apply foam dressing , ask Ortho when sutures can be removed  7. Fluids/Electrolytes/Nutrition:Routine in and outs with follow-up chemistries 8. Acute blood loss anemia. Follow-up CBC 9. 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  .Pt received botox injections by Dr. June 2020 [facility] on 11/30/2019.Still has severe RUE and RLE  tone -Patientalsoon baclofen 10 mg in the morning 20 mg nightly. -family to bring in resting WHO and day time splint from ALF -was using w/c predominantly PTA  -2/5 Discussed with [facility] OT; spasticity is a barrier during therapy. Reviewed medications: patient is receiving Baclofen 10mg , 10mg , 20mg . Patient is also having difficulty sleeping a night due to RLE pain. I do not want to increase dose of Baclofen further to avoid Baclofen toxicity and will instead add Tizanidine 2mg  HS to help with spasticity, pain, and insomnia.Not a candidate for Botox until March   10. BPH. Resume Flomax 0.4 mg daily. 11.Hyperlipidemia. Lipitor 12. Constipation. 2/4: Patient reports that he attempted to have BM twice yesterday and was unable to. Requests Miralax. He has ordered as PRN but has not received; I will schedule. Next dose this morning.  13. Confusion: Likely medication induced. Hydrocodone dc'ed already -would go ahead and hold robaxin too. -low grade temp, WBC normal --- negative  UA, UCX neg thus far  14. Insomnia: 2/4: Appears to be pain related. Will add Tizanidine 2mg  HS (see above #9 spasticity)  LOS: 4 days A FACE TO FACE EVALUATION WAS PERFORMED  Joshua Shannon  01/28/2020, 7:32 AM

## 2020-01-28 NOTE — Progress Notes (Signed)
Occupational Therapy Session Note  Patient Details  Name: Joshua Shannon MRN: [id number] Date of Birth: 22-May-1941  Today's Date: 01/28/2020 OT Individual Time: 2536-6440 OT Individual Time Calculation (min): 64 min    Short Term Goals: Week 1:  OT Short Term Goal 1 (Week 1): Pt will complete LB bathing sit to stand with mod assist for 3 consecutive sessions. OT Short Term Goal 2 (Week 1): Pt will complete LB dressing sit to stand with mod assist for 3 consecutive sessions. OT Short Term Goal 3 (Week 1): Pt will complete toilet transfer stand pivot with quadcane and mod assist. OT Short Term Goal 4 (Week 1): Pt will complete toilet hygiene and clothing management with mod assist sit to stand.  Skilled Therapeutic Interventions/Progress Updates:    Pt completed supine to sit EOB with mod assist on the left side of the bed.  Transferring to the left was not easier, even though pt likely had less pain.  He then attempted stand pivot transfer to the wheelchair with use of the quad cane, however tone in the RLE was too severe and therapist could not maintain control of all aspects of adduction and flexion.  Had pt sit back down and complete squat pivot transfer to the right with mod assist, and better control of the RLE on the floor.  He was able to then work on bathing and dressing tasks sit to stand at the sink.  He completed UB bathing with min assist and LB bathing at mod assist sit to stand.  Mod facilitation needed at the right foot to maintain contact with the floor and not adduct.  He was able to donn his pullover shirt with supervision and increased time.  Mod assist for donning brief and pants.  Therapist assisted with TEDs and right shoe with AFO.  He was able to donn the left shoe with increased time and supervision.  Therapist performed light stretching of the right digit flexors and then place the UE in the prefab resting hand splint.  Therapist also applied NMIES to the right digit extensors  as well on level 15 for 60 mins.  Pt tolerated wearing without any adverse reactions.  See settings below for [facility] Stim Go.  Finished session with pt in the wheelchair with call button and phone in reach and safety belt in place.    [employer] Stim One 330 pulse width 35 Hz pulse rate On 8 sec/ off 8 sec Ramp up/ down 2 sec Symmetrical Biphasic wave form  Max intensity at 500 Ohm load   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 Restrictions Weight Bearing Restrictions: No RLE Weight Bearing: Weight bearing as tolerated Other Position/Activity Restrictions: WBAT RLE  Pain: Pain Assessment Pain Scale: Faces Pain Score: 0-No pain ADL: See Care Tool Section for some details of mobility and selfcare  Therapy/Group: Individual Therapy  Evanell Redlich OTR/L 01/28/2020, 12:20 PM

## 2020-01-28 NOTE — Progress Notes (Signed)
Physical Therapy Session Note  Patient Details  Name: Joshua Shannon MRN: [id number] Date of Birth: Aug 13, 1941  Today's Date: 01/28/2020 PT Individual Time: 3474-2595  And [phone] PT Individual Time Calculation (min): 56 min and 41 min  Short Term Goals: Week 1:  PT Short Term Goal 1 (Week 1): Pt will initiate gait training PT Short Term Goal 2 (Week 1): Pt will perform bed<>chair transfers with mod assist PT Short Term Goal 3 (Week 1): Pt will perform bed mobility supine<>sit with min assist  Skilled Therapeutic Interventions/Progress Updates:    Session 1: Pt received sitting in w/c and agreeable to therapy session. Pt wearing R LE GRAFO throughout session. Reports need to urinate. Sit<>stand w/c<>bedrail and/or quad cane with min/mod assist for coming to standing and balance while performing mod assist LB clothing management - pt able to hold urinal and void with heavy min assist due to posterior lean. Pt reports that he used a w/c as primary means of mobility at ALF and walked 1x daily (if someone was available to assist) of approximately 200-310ft with nursing assistance but otherwise was mod-I from w/c level for propulsion and transfers. Pt reports his goal is to get back walking. Transported to/from gym in w/c for time management and energy conservation. Pt reports prior to arrival he performed stand pivot transfers to/from w/c without use of AD. Pt set-up for R stand pivot transfer to EOM with min cuing and pt managing w/c parts with min assist. R stand pivot to mat with min/mod assist for balance due to posterior lena and blocking R knee flexion due to continued increased R hamstring tone. L stand pivot back to w/c with min/mod assist for balance and pt relying heavily upon L UE support on armrests. Transported to hallway rail. Performed pre-gait training focusing on R LE NMR and decreased tone via R lateral weight shift while stepping L LE forwards/backwards - mirror feedback and manual  facilitation for improved R LE positioning. Gait training 59ft using L hallway rail with mod progressed to min assist as pt's hamstring tone lessened with +2 assist for w/c follow - therapist providing manual facilitation for R LE placement and increased toe clearance and initially having to block R knee buckle but once the tone lessened did not have to assist with this - pt reports that he has walked with very small step lengths since his stroke because if he takes bigger steps it causes posterior lean/LOB. Gait training 66ft using quad cane in L UE and again initially mod assist for balance, blocking R knee buckle, and improved R LE foot position progressed to min assist (after ~51ft) once pt's tone lessened and he was able to demonstrate improved knee extension and foot placement - continues to require slight min assist to improve toe clearance - +2 w/c follow for safety.Transported back to room and left seated in w/c with needs in reach and seat belt alarm on. Therapist wrote in pt's memory notebook for Speech Therapy.  Session 2: Pt received sitting on toilet with stedy in place and pt agreeable to therapy session. Pt had been continent of bladder. Sit>stand toilet>stedy with mod assist for lifting into standing and min/mod assist for balance while therapist performed total assist peri-care and LB clothing management. Stedy transfer to w/c. Pt reports the w/c he is in is too tall for him to perform w/c propulsion. Therapist retrieved pt a shorter w/c - anticipate pt will need 16x18 w/c but that is not available at this time (  need to trial 16in width to ensure this will not be too narrow). L stand pivot w/c>w/c using UE support on armrests with mod assist for balance and R LE management due to hamstring knee flexion tone. Pt able to adequately perform L LE w/c propulsion in this w/c with distant supervision ~130ft. Pt does report that his elbows are now hitting the armrests and it is painful - therapist  provided and donned B UE elbow protectors. Transported back to room and pt left seated in w/c with needs in reach, seat belt alarm on, and NT present.   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  Session 2: Reports pain/discomfort where elbows hit w/c armrests - provided elbow protectors.    Therapy/Group: Individual Therapy  Tawana Scale, PT, DPT 01/28/2020, 7:57 AM

## 2020-01-28 NOTE — Progress Notes (Signed)
Speech Language Pathology Daily Session Note  Patient Details  Name: Joshua Shannon MRN: [id number] Date of Birth: Oct 24, 1941  Today's Date: 01/28/2020 SLP Individual Time: 1255-1350 SLP Individual Time Calculation (min): 55 min  Short Term Goals: Week 1: SLP Short Term Goal 1 (Week 1): Pt will consume current diet with minimal overt s/sx aspiration and efficient mastication and oral clearance with Min A cues for use of swallow strategies. SLP Short Term Goal 2 (Week 1): Pt will verbally recall his swallow precaution (no straws) with Min A verbal cues. SLP Short Term Goal 3 (Week 1): Pt will demonstrate ability to problem solve during basic, functional familiar tasks wtih Min A verbal/visual cues. SLP Short Term Goal 4 (Week 1): Pt will demonstrate emergent awareness by detecting errors within tasks wtih Min A verbal/visual cues. SLP Short Term Goal 5 (Week 1): Pt will utilize external memory aids to recall new, daily information with Mod A verbal cues.  Skilled Therapeutic Interventions: Skilled treatment session focused on cognitive goals. Upon arrival, patient recalled events from previous therapy sessions with overall supervision verbal cues. SLP facilitated session by administering the MoCA-Version 7.1. Patient scored 22/30 points with deficits in executive functioning and short-term memory. Spoke with patient's wife via telephone who reports that the patient is not back to his cognitive baseline in regards to memory, therefore, recommend ongoing SLP intervention to maximize recall of functional information with use of compensatory strategies. Patient left upright in wheelchair with alarm on and all needs within reach. Continue with current plan of care.      Pain Pain Assessment Pain Scale: Faces Pain Score: 0-No pain  Therapy/Group: Individual Therapy  Laurette Villescas 01/28/2020, 3:01 PM

## 2020-01-29 ENCOUNTER — Inpatient Hospital Stay (HOSPITAL_COMMUNITY): Payer: Medicare Other | Admitting: Physical Therapy

## 2020-01-29 ENCOUNTER — Inpatient Hospital Stay (HOSPITAL_COMMUNITY): Payer: Medicare Other | Admitting: Occupational Therapy

## 2020-01-29 NOTE — Progress Notes (Signed)
Occupational Therapy Session Note  Patient Details  Name: Joshua Shannon MRN: [id number] Date of Birth: 03/06/1941  Today's Date: 01/29/2020 OT Individual Time: 1300-1414 OT Individual Time Calculation (min): 74 min   Short Term Goals: Week 1:  OT Short Term Goal 1 (Week 1): Pt will complete LB bathing sit to stand with mod assist for 3 consecutive sessions. OT Short Term Goal 2 (Week 1): Pt will complete LB dressing sit to stand with mod assist for 3 consecutive sessions. OT Short Term Goal 3 (Week 1): Pt will complete toilet transfer stand pivot with quadcane and mod assist. OT Short Term Goal 4 (Week 1): Pt will complete toilet hygiene and clothing management with mod assist sit to stand.    Skilled Therapeutic Interventions/Progress Updates:     Pt greeted in bed, finishing up pericare with NT after incontinent episode in brief. He wanted to start session by eating his breakfast, first donned Lt gripper sock using a one handed technique, unable to don sock on the Rt foot due to tone and hip pain when using reclined figure 4 position. Mod A supine<sit. Pt then ate his meal sitting EOB to work on unsupported sitting balance. He needed assistance to open containers and stabilize ice cream cup, min vcs for adherence to swallowing strategies. When pt was finished, Mod A for squat pivot<w/c going towards Rt side. He wanted to brush his teeth and shave. Taught him a one handed technique to complete oral care at max level of independence. Pt then shaved face using safety razors with Min A from OT for thoroughness on Rt side. During majority of shaving, OT completed passive stretching to Rt elbow for tone reduction. He wanted to use the manual vs electric razor for a closer shave today. At end of tx pt requested to use the toilet. He was left in care of NT to assist him with toileting. Tx focus placed on sitting balance, adaptive self care skills, and OOB tolerance.  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 Restrictions Weight Bearing Restrictions: No RLE Weight Bearing: Weight bearing as tolerated Other Position/Activity Restrictions: WBAT RLE ADL: ADL Eating: Minimal assistance Where Assessed-Eating: Wheelchair Grooming: Minimal assistance Where Assessed-Grooming: Wheelchair Upper Body Bathing: Minimal assistance Where Assessed-Upper Body Bathing: Wheelchair Lower Body Bathing: Maximal assistance Where Assessed-Lower Body Bathing: Wheelchair Upper Body Dressing: Supervision/safety Where Assessed-Upper Body Dressing: Wheelchair Lower Body Dressing: Maximal assistance Where Assessed-Lower Body Dressing: Wheelchair Toileting: Maximal assistance Toilet Transfer: Dependent(Stedy) Acupuncturist: Bedside commode      Therapy/Group: Individual Therapy  Ruford Dudzinski A Leota Maka 01/29/2020, 3:56 PM

## 2020-01-29 NOTE — Progress Notes (Signed)
Reading PHYSICAL MEDICINE & REHABILITATION PROGRESS NOTE   Subjective/Complaints:  Pt reports he's waiting for OT to come see him- was having difficulty putting RUE/wrist/hand splint in place.    ROS-  no abd pain  Or N/V.  No CP or SOB  Objective:   No results found. No results for input(s): WBC, HGB, HCT, PLT in the last 72 hours. No results for input(s): NA, K, CL, CO2, GLUCOSE, BUN, CREATININE, CALCIUM in the last 72 hours.  Intake/Output Summary (Last 24 hours) at 01/29/2020 1357 Last data filed at 01/29/2020 0900 Gross per 24 hour  Intake 240 ml  Output 250 ml  Net -10 ml     Physical Exam: Vital Signs Blood pressure (!) 150/74, pulse 88, temperature 97.6 F (36.4 C), resp. rate 20, height 5\' 7"  (1.702 m), weight 61.1 kg, SpO2 99 %. General: No acute distress. Sitting up in bedside chair; comfortable except for R hand, NAD  Mood and affect are appropriate Heart: Regular rate and rhythm no rubs murmurs or extra sounds Lungs: Clear to auscultation, breathing unlabored, no rales or wheezes Abdomen: Positive bowel sounds, soft nontender to palpation, nondistended Extremities: No clubbing, cyanosis, or edema Skin: No evidence of breakdown, no evidence of rash Neurologic: Cranial nerves II through XII intact, motor strength is 5/5 in LEFT deltoid, bicep, tricep, grip, hip flexor, knee extensors, ankle dorsiflexor and plantar flexor 0/5 RUE, 0/5 LLE in all muscle groups  Tone- MAS 3 in R elbow flexors, mAS 4 in RIght finger flexors; clawing; difficulty to get into R hand splint MAS 3 in R Hamstrings and quads. Crepitus in right knee with spasticity testing.  Sensory exam normal sensation to light touch and proprioception in bilateral upper and lower extremities Cerebellar exam normal finger to nose to finger as well as heel to shin in bilateral upper and lower extremities Musculoskeletal: Full range of motion in all 4 extremities. No joint swelling    Assessment/Plan: 1.  Functional deficits secondary to RIght hip fracture due to fall in a pt with chronic spastic hemi which require 3+ hours per day of interdisciplinary therapy in a comprehensive inpatient rehab setting.  Physiatrist is providing close team supervision and 24 hour management of active medical problems listed below.  Physiatrist and rehab team continue to assess barriers to discharge/monitor patient progress toward functional and medical goals  Care Tool:  Bathing    Body parts bathed by patient: Right arm, Chest, Abdomen, Front perineal area, Right upper leg, Left upper leg, Right lower leg, Left lower leg, Face   Body parts bathed by helper: Left arm, Buttocks Body parts n/a: Left arm, Buttocks   Bathing assist Assist Level: Moderate Assistance - Patient 50 - 74%     Upper Body Dressing/Undressing Upper body dressing   What is the patient wearing?: Pull over shirt    Upper body assist Assist Level: Supervision/Verbal cueing    Lower Body Dressing/Undressing Lower body dressing      What is the patient wearing?: Pants, Incontinence brief     Lower body assist Assist for lower body dressing: Moderate Assistance - Patient 50 - 74%     Toileting Toileting    Toileting assist Assist for toileting: Minimal Assistance - Patient > 75%     Transfers Chair/bed transfer  Transfers assist     Chair/bed transfer assist level: Moderate Assistance - Patient 50 - 74%     Locomotion Ambulation   Ambulation assist   Ambulation activity did not occur: Safety/medical  concerns  Assist level: 2 helpers Assistive device: Cane-quad Max distance: 12ft   Walk 10 feet activity   Assist  Walk 10 feet activity did not occur: Safety/medical concerns  Assist level: Moderate Assistance - Patient - 50 - 74% Assistive device: Cane-quad   Walk 50 feet activity   Assist Walk 50 feet with 2 turns activity did not occur: Safety/medical concerns         Walk 150 feet  activity   Assist Walk 150 feet activity did not occur: Safety/medical concerns         Walk 10 feet on uneven surface  activity   Assist Walk 10 feet on uneven surfaces activity did not occur: Safety/medical concerns         Wheelchair     Assist Will patient use wheelchair at discharge?: Yes Type of Wheelchair: Manual    Wheelchair assist level: Supervision/Verbal cueing Max wheelchair distance: 157ft    Wheelchair 50 feet with 2 turns activity    Assist        Assist Level: Supervision/Verbal cueing   Wheelchair 150 feet activity     Assist      Assist Level: Minimal Assistance - Patient > 75%   Blood pressure (!) 150/74, pulse 88, temperature 97.6 F (36.4 C), resp. rate 20, height 5\' 7"  (1.702 m), weight 61.1 kg, SpO2 99 %.    Medical Problem List and Plan: 1.Decreased functional mobilitysecondary to right FNF.S/P righthemiarthroplasty anterior approach 01/21/2020. Weightbearing as tolerated. 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 Was at w/c level PTA -patient may shower -ELOS/Goals: supervision to min assist. 14-20 days  -Continue CIR PT, OT.  2. Antithrombotics: -DVT/anticoagulation:Lovenox. Negative vascular study -antiplatelet therapy: resume ASA 325mg  daily 3. Pain Management:limit to tylenol, ice pack for now given AMS. 4. Mood:Prozac 40 mf daily -antipsychotic agents: N/A 5. Neuropsych: This patientiscapable of making decisions on hisown behalf. 6. Skin/Wound Care:Routine skin checks- d/c post op dressing , apply foam dressing , ask Ortho when sutures can be removed  7. Fluids/Electrolytes/Nutrition:Routine in and outs with follow-up chemistries 8. Acute blood loss anemia. Follow-up CBC 9. 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  .Pt received botox injections by Dr.  June 2020 [facility] on 11/30/2019.Still has severe RUE and RLE tone -Patientalsoon baclofen 10 mg in the morning 20 mg nightly. -family to bring in resting WHO and day time splint from ALF -was using w/c predominantly PTA  -2/5 Discussed with [facility] OT; spasticity is a barrier during therapy. Reviewed medications: patient is receiving Baclofen 10mg , 10mg , 20mg . Patient is also having difficulty sleeping a night due to RLE pain. I do not want to increase dose of Baclofen further to avoid Baclofen toxicity and will instead add Tizanidine 2mg  HS to help with spasticity, pain, and insomnia.Not a candidate for Botox until March   10. BPH. Resume Flomax 0.4 mg daily. 11.Hyperlipidemia. Lipitor 12. Constipation. 2/4: Patient reports that he attempted to have BM twice yesterday and was unable to. Requests Miralax. He has ordered as PRN but has not received; I will schedule. Next dose this morning.  2/6- LBM this AM  13. Confusion: Likely medication induced. Hydrocodone dc'ed already -would go ahead and hold robaxin too. -low grade temp, WBC normal --- negative  UA, UCX neg thus far  14. Insomnia: 2/4: Appears to be pain related. Will add Tizanidine 2mg  HS (see above #9 spasticity)  LOS: 5 days A FACE TO  FACE EVALUATION WAS PERFORMED  Joshua Shannon 01/29/2020, 1:57 PM

## 2020-01-29 NOTE — Progress Notes (Signed)
Physical Therapy Session Note  Patient Details  Name: Joshua Shannon MRN: [id number] Date of Birth: Apr 21, 1941  Today's Date: 01/29/2020 PT Individual Time: 4431-5400 PT Individual Time Calculation (min): 78 min   Short Term Goals: Week 1:  PT Short Term Goal 1 (Week 1): Pt will initiate gait training PT Short Term Goal 2 (Week 1): Pt will perform bed<>chair transfers with mod assist PT Short Term Goal 3 (Week 1): Pt will perform bed mobility supine<>sit with min assist  Skilled Therapeutic Interventions/Progress Updates:    Pt received sitting in w/c FaceTiming his wife and agreeable to therapy session. Therapist discussed pt's D/C plan as he was staying at an ALF prior to hospitalization but reports his long term plan is to move back home with his wife as they just purchased a more handicap accessible home (level entry with bed/bath on main floor); however, continues to report that his wife is small and is unable to provide a lot of physical assistance. Pt reports that he was mod-I at w/c level in ALF prior to current hospitalization and discussed his goals of returning to that level as well as continuing to be able to ambulate daily with assistance. Therapist doned B shoes and R AFO max assist. Performed L hemi-technique w/c propulsion ~113ft to therapy gym with distant supervision. Pt able to manage w/c parts (brakes and R leg rest) with min assist during session. Therapist performed seated R LE hamstring stretch for tone management x76min. Sit<>stands w/c<>quad cane with min/mod assist due to posterior lean upon coming to stand - cuing for increased anterior weight shift and trunk/hip extension and focusing on R LE knee extension to get foot on the floor due to hamstring tone causing leg to draw up. Gait training 41ft at L hallway rail with mod progressed to min assist after ~58ft once R LE tone decreased allowing increased R knee extension for improved foot placement on floor during stance - pt  requires assist for balance and improved R LE foot clearance and step length though pt continues to prefer small step lengths bilaterally - +2 w/c follow for safety. Gait training 33ft, 120ft (straight paths & seated break between) using quad cane with +2 w/c follow and mod progressed to min assist once R LE tone decreased after ~44ft - requires min assist for balance and improved R LE foot clearance and step length - demonstrates intermittent posterior lean throughout with cuing for anterior weight shift. Performed ~46ft w/c L hemi-technique w/c propulsion back to room with distant supervision. Discussed bed<>w/c transfer set-up and technique to increase pt independence. Block practice transfers to/from R EOB with pt cuing to perform R squat pivot to EOB then L stand pivot back to w/c using L UE support on w/c arm rest - required mod assist for transfer to the R due to poor motor planning of pivoting hips that direction then min assist for L transfer for balance and placing R foot on floor due to tone. Transitioned to L EOB to try block practice L stand pivot to EOB with using L UE support on bedrail and R stand/squat pivot back to w/c while pt can still use L UE support on bedrail with pt demonstrating decreased assist required for R transfer back to w/c but increased difficulty transferring L to EOB. Will continue problem solving and practice techniques for increased pt independence. Pt left seated in w/c with needs in reach and seat belt alarm on.   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 Otherwise no reports of pain.    Therapy/Group: Individual  Therapy  Ginny Forth, PT, DPT 01/29/2020, 6:35 PM

## 2020-01-29 NOTE — Progress Notes (Signed)
Physical Therapy Session Note  Patient Details  Name: Joshua Shannon MRN: [id number] Date of Birth: 1941/02/24  Today's Date: 01/29/2020 PT Individual Time: 1000-1100 PT Individual Time Calculation (min): 60 min   Short Term Goals: Week 1:  PT Short Term Goal 1 (Week 1): Pt will initiate gait training PT Short Term Goal 2 (Week 1): Pt will perform bed<>chair transfers with mod assist PT Short Term Goal 3 (Week 1): Pt will perform bed mobility supine<>sit with min assist  Skilled Therapeutic Interventions/Progress Updates:    Pt received seated in w/c in room, agreeable to PT session. Pt reports pain in R hip at rest, not rated and declines intervention. Sit to stand with mod A to QC in order to don pants dependently. Dependent to don TED hose, shoes, and R GRAFO. Dependent transport via w/c to/from therapy gym for time conservation. Session focus on RLE NMR in standing in // bars. Pt is mod A to stand in // bars with LUE support. Pt has onset of R HS tone upon initial standing that decreases with WBing. Standing alt L/R marches with max A for standing balance, fair ability to weight shift onto RLE. Standing lateral reaching with LUE outside BOS and across midline with manual cues to encourage weight shifting onto RLE, max A for standing balance with no UE support. Standing mini-squats 2 x 10 reps with heavy reliance on LUE on // bars. Pt unable to maintain standing balance during squats without LUE and falls posteriorly. Pt requests to return to bed at end of session. Squat pivot transfer w/c to bed with mod A to the L. Sit to supine mod A for RLE management and trunk control. Pt left semi-reclined in bed with needs in reach, bed alarm in place at end of session.  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

## 2020-01-30 ENCOUNTER — Inpatient Hospital Stay (HOSPITAL_COMMUNITY): Payer: Medicare Other | Admitting: Speech Pathology

## 2020-01-30 MED ORDER — TIZANIDINE HCL 2 MG PO TABS
2.0000 mg | ORAL_TABLET | Freq: Every day | ORAL | Status: DC
  Administered 2020-01-30: 2 mg via ORAL
  Filled 2020-01-30: qty 1

## 2020-01-30 MED ORDER — ACETAMINOPHEN 500 MG PO TABS
500.0000 mg | ORAL_TABLET | ORAL | Status: DC | PRN
  Administered 2020-01-30 – 2020-02-09 (×11): 500 mg via ORAL
  Filled 2020-01-30 (×13): qty 1

## 2020-01-30 MED ORDER — TRAZODONE HCL 50 MG PO TABS
25.0000 mg | ORAL_TABLET | Freq: Every day | ORAL | Status: DC
  Administered 2020-01-30: 25 mg via ORAL
  Filled 2020-01-30: qty 1

## 2020-01-30 NOTE — Progress Notes (Signed)
[place] PHYSICAL MEDICINE & REHABILITATION PROGRESS NOTE   Subjective/Complaints:  Pt reports he's not sleeping due to pain of RLE as well as RUE due to spasticity- kept rubbing leg-   Asked if there was a way to write on board when pain meds (mainly tylenol) are due so knows when can ask next.  Also wondering if can have something to sleep better as needed. Wrote for trazodone 25 mg QHS prn and started Zanaflex that wasn't on med list 2 mg QHS   ROS-  no abd pain  Or N/V.  No CP or SOB  Objective:   No results found. No results for input(s): WBC, HGB, HCT, PLT in the last 72 hours. No results for input(s): NA, K, CL, CO2, GLUCOSE, BUN, CREATININE, CALCIUM in the last 72 hours.  Intake/Output Summary (Last 24 hours) at 01/30/2020 1107 Last data filed at 01/30/2020 0816 Gross per 24 hour  Intake 420 ml  Output 300 ml  Net 120 ml     Physical Exam: Vital Signs Blood pressure (!) 160/77, pulse 69, temperature 97.6 F (36.4 C), resp. rate 20, height 5\' 7"  (1.702 m), weight 61.1 kg, SpO2 98 %. General: No acute distress. Sitting up in bedside chair; comfortable except for R hand- rubbing R hand and R leg constantly, NAD  Mood and affect are appropriate Heart: Regular rate and rhythm no rubs murmurs or extra sounds Lungs: Clear to auscultation, breathing unlabored, no rales or wheezes Abdomen: Positive bowel sounds, soft nontender to palpation, nondistended Extremities: No clubbing, cyanosis, or edema Skin: No evidence of breakdown, no evidence of rash Neurologic: Cranial nerves II through XII intact, motor strength is 5/5 in LEFT deltoid, bicep, tricep, grip, hip flexor, knee extensors, ankle dorsiflexor and plantar flexor 0/5 RUE, 0/5 LLE in all muscle groups  Tone- MAS 3 in R elbow flexors, mAS 4 in RIght finger flexors; clawing; difficulty to get into R hand splint MAS 3 in R Hamstrings and quads. Crepitus in right knee with spasticity testing.  Sensory exam normal sensation  to light touch and proprioception in bilateral upper and lower extremities Cerebellar exam normal finger to nose to finger as well as heel to shin in bilateral upper and lower extremities Musculoskeletal: Full range of motion in all 4 extremities. No joint swelling    Assessment/Plan: 1. Functional deficits secondary to RIght hip fracture due to fall in a pt with chronic spastic hemi which require 3+ hours per day of interdisciplinary therapy in a comprehensive inpatient rehab setting.  Physiatrist is providing close team supervision and 24 hour management of active medical problems listed below.  Physiatrist and rehab team continue to assess barriers to discharge/monitor patient progress toward functional and medical goals  Care Tool:  Bathing    Body parts bathed by patient: Right arm, Chest, Abdomen, Front perineal area, Right upper leg, Left upper leg, Right lower leg, Left lower leg, Face   Body parts bathed by helper: Left arm, Buttocks Body parts n/a: Left arm, Buttocks   Bathing assist Assist Level: Moderate Assistance - Patient 50 - 74%     Upper Body Dressing/Undressing Upper body dressing   What is the patient wearing?: Pull over shirt    Upper body assist Assist Level: Supervision/Verbal cueing    Lower Body Dressing/Undressing Lower body dressing      What is the patient wearing?: Pants, Incontinence brief     Lower body assist Assist for lower body dressing: Moderate Assistance - Patient 50 - 74%  Toileting Toileting    Toileting assist Assist for toileting: Minimal Assistance - Patient > 75%     Transfers Chair/bed transfer  Transfers assist     Chair/bed transfer assist level: Moderate Assistance - Patient 50 - 74%     Locomotion Ambulation   Ambulation assist   Ambulation activity did not occur: Safety/medical concerns  Assist level: 2 helpers(+2 w/c follow) Assistive device: Cane-quad Max distance: 125ft   Walk 10 feet  activity   Assist  Walk 10 feet activity did not occur: Safety/medical concerns  Assist level: 2 helpers(+2 w/c follow) Assistive device: Cane-quad   Walk 50 feet activity   Assist Walk 50 feet with 2 turns activity did not occur: Safety/medical concerns         Walk 150 feet activity   Assist Walk 150 feet activity did not occur: Safety/medical concerns         Walk 10 feet on uneven surface  activity   Assist Walk 10 feet on uneven surfaces activity did not occur: Safety/medical concerns         Wheelchair     Assist Will patient use wheelchair at discharge?: Yes Type of Wheelchair: Manual    Wheelchair assist level: Supervision/Verbal cueing Max wheelchair distance: 169ft    Wheelchair 50 feet with 2 turns activity    Assist        Assist Level: Supervision/Verbal cueing   Wheelchair 150 feet activity     Assist      Assist Level: Minimal Assistance - Patient > 75%   Blood pressure (!) 160/77, pulse 69, temperature 97.6 F (36.4 C), resp. rate 20, height 5\' 7"  (1.702 m), weight 61.1 kg, SpO2 98 %.    Medical Problem List and Plan: 1.Decreased functional mobilitysecondary to right FNF.S/P righthemiarthroplasty anterior approach 01/21/2020. Weightbearing as tolerated. 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 Was at w/c level PTA -patient may shower -ELOS/Goals: supervision to min assist. 14-20 days  -Continue CIR PT, OT.  2. Antithrombotics: -DVT/anticoagulation:Lovenox. Negative vascular study -antiplatelet therapy: resume ASA 325mg  daily 3. Pain Management:limit to tylenol, ice pack for now given AMS. 4. Mood:Prozac 40 mf daily -antipsychotic agents: N/A 5. Neuropsych: This patientiscapable of making decisions on hisown behalf. 6. Skin/Wound Care:Routine skin checks- d/c post op dressing , apply foam dressing , ask Ortho  when sutures can be removed  7. Fluids/Electrolytes/Nutrition:Routine in and outs with follow-up chemistries 8. Acute blood loss anemia. Follow-up CBC 9. 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  .Pt received botox injections by Dr. June 2020 [facility] on 11/30/2019.Still has severe RUE and RLE tone -Patientalsoon baclofen 10 mg in the morning 20 mg nightly. -family to bring in resting WHO and day time splint from ALF -was using w/c predominantly PTA  -2/5 Discussed with [facility] OT; spasticity is a barrier during therapy. Reviewed medications: patient is receiving Baclofen 10mg , 10mg , 20mg . Patient is also having difficulty sleeping a night due to RLE pain. I do not want to increase dose of Baclofen further to avoid Baclofen toxicity and will instead add Tizanidine 2mg  HS to help with spasticity, pain, and insomnia.Not a candidate for Botox until March   2/7- Zanaflex was not on med list- will start for tonight 10. BPH. Resume Flomax 0.4 mg daily. 11.Hyperlipidemia. Lipitor 12. Constipation. 2/4: Patient reports that he attempted to have BM twice yesterday and was unable to. Requests Miralax. He has ordered as PRN but has  not received; I will schedule. Next dose this morning.  2/6- LBM this AM  13. Confusion: Likely medication induced. Hydrocodone dc'ed already -would go ahead and hold robaxin too. -low grade temp, WBC normal --- negative  UA, UCX neg thus far  14. Insomnia: 2/4: Appears to be pain related. Will add Tizanidine 2mg  HS (see above #9 spasticity)  2/7- added Trazodone 25 mg QHS prn for sleep to help LOS: 6 days A FACE TO FACE EVALUATION WAS PERFORMED  Monet North 01/30/2020, 11:07 AM

## 2020-01-30 NOTE — Progress Notes (Addendum)
Patient did not sleep well during the night. C/O spasms/pain to RLE throughout the night, provided prn pain meds as needed. Pt continued to have his right knee flexed toward his chest and rubbing his right inner thigh stating that it helped. RN continued to assist pt to extend knee and RLE towards bed. Pt was very hesitant to take Ultracet because he did not want to hallucinate. Educated patient on medication, no signs of hallucinations during night. No signs of distress during this time, Call light in reach. Right wrist splint in place during night.

## 2020-01-30 NOTE — Progress Notes (Signed)
Speech Language Pathology Daily Session Note  Patient Details  Name: Joshua Shannon MRN: [id number] Date of Birth: January 14, 1941  Today's Date: 01/30/2020 SLP Individual Time: 1122-1205 SLP Individual Time Calculation (min): 43 min  Short Term Goals: Week 1: SLP Short Term Goal 1 (Week 1): Pt will consume current diet with minimal overt s/sx aspiration and efficient mastication and oral clearance with Min A cues for use of swallow strategies. SLP Short Term Goal 2 (Week 1): Pt will verbally recall his swallow precaution (no straws) with Min A verbal cues. SLP Short Term Goal 3 (Week 1): Pt will demonstrate ability to problem solve during basic, functional familiar tasks wtih Min A verbal/visual cues. SLP Short Term Goal 4 (Week 1): Pt will demonstrate emergent awareness by detecting errors within tasks wtih Min A verbal/visual cues. SLP Short Term Goal 5 (Week 1): Pt will utilize external memory aids to recall new, daily information with Mod A verbal cues.  Skilled Therapeutic Interventions:  Pt was seen for skilled ST targeting goals for dysphagia and cognition.  Upon arrival, pt was resting in bed but was awakened easily and pleasantly interactive.  He reports poor sleep last night due to pain but neither pain nor lethargy were limiting factors in today's therapy session.  Pt independently recalled that today was Super Bowl Sunday and identified the two teams who were playing as well as the names of their quarterbacks.  Pt also independently asked for therapist to update his memory notebook prior to the end of today's therapy session.  Pt reports that the pain medications he has been taking since being hospitalized have given him vivid hallucinations but these seem to be getting better.  Overall, pt reports that he feels his cognition is near his baseline.  SLP also facilitated the session with cup sips of thin liquids to monitor toleration of currently prescribed diet.  Pt recalled his swallowing  precautions with supervision questions cues and implemented them when consuming cup sips of water with no overt s/s of aspiration.  Pt was left in bed with bed alarm set and call bell within reach.  Continue per current plan of care.    Pain Pain Assessment Pain Scale: 0-10 Pain Score: 0-No pain  Therapy/Group: Individual Therapy  Joshua Shannon, Joshua Shannon 01/30/2020, 12:45 PM

## 2020-01-31 ENCOUNTER — Inpatient Hospital Stay (HOSPITAL_COMMUNITY): Payer: Medicare Other

## 2020-01-31 ENCOUNTER — Inpatient Hospital Stay (HOSPITAL_COMMUNITY): Payer: Medicare Other | Admitting: Speech Pathology

## 2020-01-31 ENCOUNTER — Inpatient Hospital Stay (HOSPITAL_COMMUNITY): Payer: Medicare Other | Admitting: Occupational Therapy

## 2020-01-31 LAB — CREATININE, SERUM
Creatinine, Ser: 1.06 mg/dL (ref 0.61–1.24)
GFR calc Af Amer: >60 mL/min (ref >60)
GFR calc non Af Amer: >60 mL/min (ref >60)

## 2020-01-31 MED ORDER — TIZANIDINE HCL 2 MG PO TABS
2.0000 mg | ORAL_TABLET | Freq: Three times a day (TID) | ORAL | Status: DC
  Administered 2020-01-31 – 2020-02-02 (×7): 2 mg via ORAL
  Filled 2020-01-31 (×7): qty 1

## 2020-01-31 MED ORDER — CLONAZEPAM 0.25 MG PO TBDP
0.2500 mg | ORAL_TABLET | Freq: Every day | ORAL | Status: DC
  Administered 2020-01-31 – 2020-02-10 (×11): 0.25 mg via ORAL
  Filled 2020-01-31 (×11): qty 1

## 2020-01-31 MED ORDER — BACLOFEN 5 MG HALF TABLET
5.0000 mg | ORAL_TABLET | Freq: Two times a day (BID) | ORAL | Status: DC
  Administered 2020-01-31 – 2020-02-01 (×4): 5 mg via ORAL
  Filled 2020-01-31 (×5): qty 1

## 2020-01-31 NOTE — Progress Notes (Signed)
[place] PHYSICAL MEDICINE & REHABILITATION PROGRESS NOTE   Subjective/Complaints:  No issues overnite  Except Right hamstring pain (pt points to behind flexed knee)    ROS-  no abd pain  Or N/V.  No CP or SOB  Objective:   No results found. No results for input(s): WBC, HGB, HCT, PLT in the last 72 hours. Recent Labs    01/31/20 0525  CREATININE 1.06    Intake/Output Summary (Last 24 hours) at 01/31/2020 0747 Last data filed at 01/31/2020 0432 Gross per 24 hour  Intake --  Output 350 ml  Net -350 ml     Physical Exam: Vital Signs Blood pressure (!) 143/74, pulse 77, temperature 98 F (36.7 C), resp. rate 17, height 5\' 7"  (1.702 m), weight 61.1 kg, SpO2 98 %. General: No acute distress. Sitting up in bedside chair; comfortable except for R hand- rubbing R hand and R leg constantly, NAD  Mood and affect are appropriate Heart: Regular rate and rhythm no rubs murmurs or extra sounds Lungs: Clear to auscultation, breathing unlabored, no rales or wheezes Abdomen: Positive bowel sounds, soft nontender to palpation, nondistended Extremities: No clubbing, cyanosis, or edema Skin: No evidence of breakdown, no evidence of rash Neurologic: Cranial nerves II through XII intact, motor strength is 5/5 in LEFT deltoid, bicep, tricep, grip, hip flexor, knee extensors, ankle dorsiflexor and plantar flexor 0/5 RUE, 0/5 LLE in all muscle groups  Tone- MAS 3 in R elbow flexors, mAS 4 in RIght finger flexors; clawing; difficulty to get into R hand splint MAS 3 in R Hamstrings and quads. Crepitus in right knee with spasticity testing.  Sensory exam normal sensation to light touch and proprioception in bilateral upper and lower extremities Cerebellar exam normal finger to nose to finger as well as heel to shin in bilateral upper and lower extremities Musculoskeletal: Full range of motion in all 4 extremities. No joint swelling    Assessment/Plan: 1. Functional deficits secondary to RIght hip  fracture due to fall in a pt with chronic spastic hemi which require 3+ hours per day of interdisciplinary therapy in a comprehensive inpatient rehab setting.  Physiatrist is providing close team supervision and 24 hour management of active medical problems listed below.  Physiatrist and rehab team continue to assess barriers to discharge/monitor patient progress toward functional and medical goals  Care Tool:  Bathing    Body parts bathed by patient: Right arm, Chest, Abdomen, Front perineal area, Right upper leg, Left upper leg, Right lower leg, Left lower leg, Face   Body parts bathed by helper: Left arm, Buttocks Body parts n/a: Left arm, Buttocks   Bathing assist Assist Level: Moderate Assistance - Patient 50 - 74%     Upper Body Dressing/Undressing Upper body dressing   What is the patient wearing?: Pull over shirt    Upper body assist Assist Level: Supervision/Verbal cueing    Lower Body Dressing/Undressing Lower body dressing      What is the patient wearing?: Pants, Incontinence brief     Lower body assist Assist for lower body dressing: Moderate Assistance - Patient 50 - 74%     Toileting Toileting    Toileting assist Assist for toileting: Minimal Assistance - Patient > 75%     Transfers Chair/bed transfer  Transfers assist     Chair/bed transfer assist level: Moderate Assistance - Patient 50 - 74%     Locomotion Ambulation   Ambulation assist   Ambulation activity did not occur: Safety/medical concerns  Assist  level: 2 helpers(+2 w/c follow) Assistive device: Cane-quad Max distance: 170ft   Walk 10 feet activity   Assist  Walk 10 feet activity did not occur: Safety/medical concerns  Assist level: 2 helpers(+2 w/c follow) Assistive device: Cane-quad   Walk 50 feet activity   Assist Walk 50 feet with 2 turns activity did not occur: Safety/medical concerns         Walk 150 feet activity   Assist Walk 150 feet activity did not  occur: Safety/medical concerns         Walk 10 feet on uneven surface  activity   Assist Walk 10 feet on uneven surfaces activity did not occur: Safety/medical concerns         Wheelchair     Assist Will patient use wheelchair at discharge?: Yes Type of Wheelchair: Manual    Wheelchair assist level: Supervision/Verbal cueing Max wheelchair distance: 188ft    Wheelchair 50 feet with 2 turns activity    Assist        Assist Level: Supervision/Verbal cueing   Wheelchair 150 feet activity     Assist      Assist Level: Minimal Assistance - Patient > 75%   Blood pressure (!) 143/74, pulse 77, temperature 98 F (36.7 C), resp. rate 17, height 5\' 7"  (1.702 m), weight 61.1 kg, SpO2 98 %.    Medical Problem List and Plan: 1.Decreased functional mobilitysecondary to right FNF.S/P righthemiarthroplasty anterior approach 01/21/2020. Weightbearing as tolerated. 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 Was at w/c level PTA -patient may shower -ELOS/Goals: supervision to min assist. 14-20 days  -Continue CIR PT, OT.  2. Antithrombotics: -DVT/anticoagulation:Lovenox. Negative vascular study -antiplatelet therapy: resume ASA 325mg  daily 3. Pain Management:limit to tylenol, ice pack for now given AMS. 4. Mood:Prozac 40 mf daily -antipsychotic agents: N/A 5. Neuropsych: This patientiscapable of making decisions on hisown behalf. 6. Skin/Wound Care:Routine skin checks- suture removal ~14d post  7. Fluids/Electrolytes/Nutrition:Routine in and outs with follow-up chemistries 8. Acute blood loss anemia. Follow-up CBC 9. 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  .Pt received botox injections by Dr. Dimas [facility] on 11/30/2019.Still has severe RUE and RLE tone -Patientalsoon baclofen 10 mg in the  morning 20 mg nightly. -family to bring in resting WHO and day time splint from ALF -was using w/c predominantly PTA  reduce baclofen and increase tizanidine,add Klonopin qhs and will monitor for sedation  botulinum toxin as OP  10. BPH. Resume Flomax 0.4 mg daily. 11.Hyperlipidemia. Lipitor 12. Constipation. 2/4: Patient reports that he attempted to have BM twice yesterday and was unable to. Requests Miralax. He has ordered as PRN but has not received; I will schedule. Next dose this morning.  2/6- LBM this AM  13. Confusion: Likely medication induced. Hydrocodone dc'ed already -would go ahead and hold robaxin too. -low grade temp, WBC normal --- negative  UA, UCX neg thus far  14. Insomnia:  2/7- added Trazodone 25 mg QHS prn for sleep to help LOS: 7 days A FACE TO FACE EVALUATION WAS PERFORMED  Charlett Blake 01/31/2020, 7:47 AM

## 2020-01-31 NOTE — Progress Notes (Signed)
Physical Therapy Session Note  Patient Details  Name: Joshua Shannon MRN: [id number] Date of Birth: [birth year]  Today's Date: 01/31/2020 PT Individual Time: 1346-1458 PT Individual Time Calculation (min): 72 min   Short Term Goals: Week 1:  PT Short Term Goal 1 (Week 1): Pt will initiate gait training PT Short Term Goal 2 (Week 1): Pt will perform bed<>chair transfers with mod assist PT Short Term Goal 3 (Week 1): Pt will perform bed mobility supine<>sit with min assist  Skilled Therapeutic Interventions/Progress Updates:    Pt seated in w/c upon PT arrival, agreeable to therapy tx and denies pain. Pt reports he feels tired from medications he had yesterday, MD already aware. Pt transported to the gym in w/c. Pt performed stand pivot to the mat with min assist. Pt noted to be incontinent of bladder and had also spilled cup of water on his pants/shirt which are now soiled. Pt performed sit<>stands from the mat with min-mod assist and QC while therapist assisted to change soiled clothes and brief (privacy curtain used in the dayroom), pt able to maintain standing balance with min assist and occasional posterior lean (cues to correct), standing balance while performing pericare, therapist donned clean brief total assist. Seated edge of mat with supervision for sitting balance while doffing soiled shirt and donning clean shirt. Pt seated edge of mat therapist performed hamstring stretching 2 x 30 sec. Pt worked on standing and lateral weightshifting to the R for increased R LE weightbearing, cues for pt to increased R knee extension during this. Pt ambulated x 25 ft this session with QC and min assist, cues for step length and R knee extension during stance. Pt participated in treadmill training this session with use of litegait harness system, mod assist +2 to step on/off treadmill and min assist for standing balance while donning harness. Gait training as follows: Trial 1-  2:49 min, 0.5 mph, x120 ft - +2  to assist with lateral weightshifting at the hips, other therapist assisting with R LE step/placement, cues for increased L step length Trial 2- 1:11 min, 0.5 mph, x 50 ft - +2 to assist with lateral weightshifting at the hips, other therapist assisting with R LE step/placement, cues for increased L step length - increased fatigue and difficulty during this and pt beginning to crouch Between bouts of gait pt performed the following exercises within litegait harness working on R lateral weightshifting, weightbearing and stance control- standing on R leg (with L UE support) with cues for knee extension, mini squats x 10 without UE support and emphasis on full knee extension in standing- pt with posterior lean at times and unable to initiate any strategies to prevent LOB within harness.  Pt worked on w/c propulsion this session x 120 ft with supervision using L UE/LE. Pt performed x 2 sit<>stands with QC and min-mod assist, in standing worked on balance, weightshifting and upright posture while performing L UE hand taps to beach ball reaching outside BOS. Pt transported back to room and performed stand pivot to bed with min assist. Sit>supine with min assist, doffed pants and pt left supine with needs in reach and bed alarm set.    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 Restrictions Weight Bearing Restrictions: No RLE Weight Bearing: Weight bearing as tolerated Other Position/Activity Restrictions: WBAT RLE    Therapy/Group: Individual Therapy  Cresenciano Genre, PT, DPT, CSRS 01/31/2020, 7:41 AM

## 2020-01-31 NOTE — Progress Notes (Signed)
Occupational Therapy Weekly Progress Note  Patient Details  Name: Joshua Shannon MRN: [id number] Date of Birth: [birth year]  Beginning of progress report period: January 25, 2020 End of progress report period: January 31, 2020  Today's Date: 01/31/2020 OT Individual Time: 0488-8916 OT Individual Time Calculation (min): 76 min    Patient has met 3 of 4 short term goals.  Joshua Shannon is making slow but steady progress with OT at this time.  He currently needs min assist for UB bathing with supervision for dressing.  Mod assist is needed for LB bathing sit to stand in the shower with mod assist for LB dressing sit to stand at the sink.  He continues to need overall mod assist for stand pivot transfers to the toilet as well to the seat in the walk-in shower.  RLE pain has been present but not severe with the most limiting factor in his transfers being the history or RLE hamstrings tone as well as right hemiparesis.  With transitions sit to stand and standing the RLE will flex up off of the floor unless he is given mod facilitation for knee extension and for avoiding adduction.  RUE hemiparesis is present as well with severe flexor tone noted in the digits as well as the elbow.  He continues to tolerate his resting hand splint during the day and at night,  Therapist has been incorporating some NMES to the digit extensors to try and help alleviate some of the tone.  Feel Joshua Shannon continues to need comprehensive OT at this time in order to work on reaching min assist level goals or better.  Recommend continued CIR level therapy with anticipated discharge back to the ALF on 2/19.    Patient continues to demonstrate the following deficits: muscle weakness, impaired timing and sequencing, abnormal tone, unbalanced muscle activation and decreased coordination and decreased sitting balance, decreased standing balance, decreased postural control, hemiplegia and decreased balance strategies and therefore will continue to  benefit from skilled OT intervention to enhance overall performance with BADL and Reduce care partner burden.  Patient progressing toward long term goals..  Continue plan of care.  OT Short Term Goals Week 2:  OT Short Term Goal 1 (Week 2): Pt will complete toilet transfer stand pivot with quadcane and mod assist. OT Short Term Goal 2 (Week 2): Pt will complete walk-in shower transfers with mod assist using the [place] for support. OT Short Term Goal 3 (Week 2): Pt will complete LB bathing sit to stand with min assist. OT Short Term Goal 4 (Week 2): Pt will complete LB dressing sit to stand with min assist including AFO and shoes.  Skilled Therapeutic Interventions/Progress Updates:    Pt seen for am OT session with focus on ADL performance.  Pt completed supine to sit EOB with mod assist to begin session.  The [place] was used for time management in order to transfer to the shower.  He was able to complete sit to stand with use of the [place] at min assist level.  Therapist provided min facilitation to maintain right foot on the surface of the [place] and to prevent hamstring flexion.  Once in the shower, he was able to complete UB bathing with min assist and LB bathing with mod assist sit to stand.  Min instructional cueing was needed for sequencing and organization as he would attempt to wash some body multiple times, without sequencing to other parts.  Mod assist for stand pivot transfer to the wheelchair in order  to work on dressing.  He donned a pullover shirt with setup assistance.  Brief and pants were donned with mod assist.  Therapist assisted with TEDs and then he was able to donn both the right and left shoe and AFO.  He used the one handed tying technique for the left shoe with increased time and min assist.  Max assist was given for tying the right shoe.  Pt finished session sitting in the wheelchair with the call button and phone in reach and safety belt in place.  RUE resting hand splint was  placed on the right hand as well.    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

## 2020-01-31 NOTE — Plan of Care (Signed)
  Problem: Consults Goal: RH GENERAL PATIENT EDUCATION Description: See Patient Education module for education specifics. Outcome: Progressing Goal: Skin Care Protocol Initiated - if Braden Score 18 or less Description: If consults are not indicated, leave blank or document N/A Outcome: Progressing   Problem: RH BLADDER ELIMINATION Goal: RH STG MANAGE BLADDER WITH ASSISTANCE Description: STG Manage Bladder With min Assistance Outcome: Progressing   Problem: RH SKIN INTEGRITY Goal: RH STG SKIN FREE OF INFECTION/BREAKDOWN Description: Pt will cont to be free of skin breakdown or infection during CIR stay with supervision assist Outcome: Progressing Goal: RH STG MAINTAIN SKIN INTEGRITY WITH ASSISTANCE Description: STG Maintain Skin Integrity With supervision Assistance. Outcome: Progressing Goal: RH STG ABLE TO PERFORM INCISION/WOUND CARE W/ASSISTANCE Description: STG Able To Perform Incision/Wound Care With min Assistance. Outcome: Progressing   Problem: RH SAFETY Goal: RH STG ADHERE TO SAFETY PRECAUTIONS W/ASSISTANCE/DEVICE Description: STG Adhere to Safety Precautions With cues/reminders Assistance/Device. Outcome: Progressing   

## 2020-01-31 NOTE — Progress Notes (Signed)
Speech Language Pathology Daily Session Note  Patient Details  Name: ATZIN BUCHTA MRN: [id number] Date of Birth: 1941/12/21  Today's Date: 01/31/2020 SLP Individual Time: 1100-1155 SLP Individual Time Calculation (min): 55 min  Short Term Goals: Week 1: SLP Short Term Goal 1 (Week 1): Pt will consume current diet with minimal overt s/sx aspiration and efficient mastication and oral clearance with Min A cues for use of swallow strategies. SLP Short Term Goal 2 (Week 1): Pt will verbally recall his swallow precaution (no straws) with Min A verbal cues. SLP Short Term Goal 3 (Week 1): Pt will demonstrate ability to problem solve during basic, functional familiar tasks wtih Min A verbal/visual cues. SLP Short Term Goal 4 (Week 1): Pt will demonstrate emergent awareness by detecting errors within tasks wtih Min A verbal/visual cues. SLP Short Term Goal 5 (Week 1): Pt will utilize external memory aids to recall new, daily information with Mod A verbal cues.  Skilled Therapeutic Interventions: Skilled treatment session focused on cognitive goals. Upon arrival, patient was asleep in the wheelchair and required Max cues for arousal. Patient reported increased lethargy today due to not sleeping last night, however, patient agreeable to participate in treatment session. SLP facilitated session by providing extra time and Min A verbal cues for problem solving during a complex novel card task. However, patient was overall Mod I for use of external aids to recall the procedures of task. Patient independently requested SLP to record events from session in memory notebook. Patient left upright in wheelchair with alarm on and all needs within reach. Continue with current plan of care.      Pain Pain Assessment Pain Scale: 0-10 Pain Score: 0-No pain  Therapy/Group: Individual Therapy  Shihab States 01/31/2020, 12:14 PM

## 2020-02-01 ENCOUNTER — Inpatient Hospital Stay (HOSPITAL_COMMUNITY): Payer: Medicare Other | Admitting: Speech Pathology

## 2020-02-01 ENCOUNTER — Inpatient Hospital Stay (HOSPITAL_COMMUNITY): Payer: Medicare Other | Admitting: Occupational Therapy

## 2020-02-01 ENCOUNTER — Inpatient Hospital Stay (HOSPITAL_COMMUNITY): Payer: Medicare Other | Admitting: Physical Therapy

## 2020-02-01 NOTE — Plan of Care (Signed)
  Problem: Consults Goal: RH GENERAL PATIENT EDUCATION Description: See Patient Education module for education specifics. Outcome: Progressing Goal: Skin Care Protocol Initiated - if Braden Score 18 or less Description: If consults are not indicated, leave blank or document N/A Outcome: Progressing   Problem: RH BLADDER ELIMINATION Goal: RH STG MANAGE BLADDER WITH ASSISTANCE Description: STG Manage Bladder With min Assistance Outcome: Progressing   Problem: RH SKIN INTEGRITY Goal: RH STG SKIN FREE OF INFECTION/BREAKDOWN Description: Pt will cont to be free of skin breakdown or infection during CIR stay with supervision assist Outcome: Progressing Goal: RH STG MAINTAIN SKIN INTEGRITY WITH ASSISTANCE Description: STG Maintain Skin Integrity With supervision Assistance. Outcome: Progressing Goal: RH STG ABLE TO PERFORM INCISION/WOUND CARE W/ASSISTANCE Description: STG Able To Perform Incision/Wound Care With min Assistance. Outcome: Progressing   Problem: RH SAFETY Goal: RH STG ADHERE TO SAFETY PRECAUTIONS W/ASSISTANCE/DEVICE Description: STG Adhere to Safety Precautions With cues/reminders Assistance/Device. Outcome: Progressing   

## 2020-02-01 NOTE — Progress Notes (Signed)
Occupational Therapy Session Note  Patient Details  Name: Joshua Shannon MRN: [id number] Date of Birth: [birth year]  Today's Date: 02/01/2020 OT Individual Time: 1000-1105 OT Individual Time Calculation (min): 65 min    Short Term Goals: Week 2:  OT Short Term Goal 1 (Week 2): Pt will complete toilet transfer stand pivot with quadcane and mod assist. OT Short Term Goal 2 (Week 2): Pt will complete walk-in shower transfers with mod assist using the quadcane for support. OT Short Term Goal 3 (Week 2): Pt will complete LB bathing sit to stand with min assist. OT Short Term Goal 4 (Week 2): Pt will complete LB dressing sit to stand with min assist including AFO and shoes.  Skilled Therapeutic Interventions/Progress Updates:    Pt taken to the gym for OT treatment with focus on RUE neuromuscular re-education.  Resting hand splint was removed from the right hand and support for weightbearing was placed to keep the fingers open.  NMES was applied to the dorsal forearm for facilitation of digit extensors as well.  Therapist completed scapular mobilizations in sitting then had pt place the RUE on a tilted stool with use of dycem and therapist strapping the right hand down to the stool to keep from pulling off of the surface.  He was then able to complete simple movements of shoulder flexion and elbow extension to target on the tilted stool.  He was able to activate some elbow extension, but then tone would kick in at the elbow flexors and he would need min to mod assist to complete the movement.  Transitioned to working on slight external rotation with hand on a ball which was placed on top of a board.  Mod assist for rolling the ball over to the right on the board.  Finished session with repositioning of the RUE in the resting hand splint with NMES maintained in place.  Pt taken back to the room and left in the wheelchair with safety belt in place.  Pt tolerated NMES for 60 mins total and with 30 mins being  outside of therapy.  Pt with no adverse reactions to stimulation.  Pt tolerated level 20 intensity.  See below for other settings.    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

## 2020-02-01 NOTE — Progress Notes (Signed)
Speech Language Pathology Discharge Summary  Patient Details  Name: Joshua Shannon MRN: [id number] Date of Birth: 1941-09-17  Today's Date: 02/01/2020 SLP Individual Time: 1430-1500 SLP Individual Time Calculation (min): 30 min   Skilled Therapeutic Interventions:  Pt was seen for skilled ST targeting cognitive goals. Pt used memory notebook to recall functional daily information with 1 Supervision A level prompt. He also independently requested SLP write in notebook prior to end of session. Pt recalled instructions for a familiar complex card task with visual cues (cards set out in front of him). He was Mod I for problem solving throughout task. In functional conversation, pt also recalled details of first day of evaluations on CIR and this therapist's involvement. He also recalled need for no straws during PO intake with Min A question cues. Pt is determined back to baseline cognitive status and therefore no further ST is indicated at this time. Pt left sitting in wheelchair with alarm set and needs within reach. Continue per current plan of care.      Patient has met 5 of 5 long term goals.  Patient to discharge at overall Supervision;Min level.  Reasons goals not met: n/a   Clinical Impression/Discharge Summary:   Pt made functional gains and met 5 out of 5 long term goals this admission. Pt currently requires Supervision-Min assist for mildly complex cognitive tasks and will require 24/7 supervision at discharge. Pt is consuming regular diet with thin liquids and recommendation for NO straws; pt is using safe swallow precautions and strategies with Supervision A verbal cues. Pt has demonstrated improved insight into deficits, error awareness, basic problem solving, and use of compensatory strategies for short term recall. No follow up ST is indicated and pt education is complete at this time.          Recommendation:  24 hour supervision/assistance      Equipment: none   Reasons for  discharge: Treatment goals met   Patient/Family Agrees with Progress Made and Goals Achieved: Yes    [name] 02/01/2020, 3:09 PM

## 2020-02-01 NOTE — Progress Notes (Signed)
[place] PHYSICAL MEDICINE & REHABILITATION PROGRESS NOTE   Subjective/Complaints:  No issues overnite, slept better , leg is straight today    ROS-  no abd pain  Or N/V.  No CP or SOB  Objective:   No results found. No results for input(s): WBC, HGB, HCT, PLT in the last 72 hours. Recent Labs    01/31/20 0525  CREATININE 1.06    Intake/Output Summary (Last 24 hours) at 02/01/2020 0804 Last data filed at 02/01/2020 0759 Gross per 24 hour  Intake 953 ml  Output 250 ml  Net 703 ml     Physical Exam: Vital Signs Blood pressure (!) 142/73, pulse 83, temperature (!) 97.5 F (36.4 C), resp. rate 18, height 5\' 7"  (1.702 m), weight 61.1 kg, SpO2 97 %. General: No acute distress. Sitting up in bedside chair; comfortable except for R hand- rubbing R hand and R leg constantly, NAD  Mood and affect are appropriate Heart: Regular rate and rhythm no rubs murmurs or extra sounds Lungs: Clear to auscultation, breathing unlabored, no rales or wheezes Abdomen: Positive bowel sounds, soft nontender to palpation, nondistended Extremities: No clubbing, cyanosis, or edema Skin: No evidence of breakdown, no evidence of rash Neurologic: Cranial nerves II through XII intact, motor strength is 5/5 in LEFT deltoid, bicep, tricep, grip, hip flexor, knee extensors, ankle dorsiflexor and plantar flexor 0/5 RUE, 0/5 LLE in all muscle groups  Tone- MAS 3 in R elbow flexors, mAS 4 in RIght finger flexors; clawing; difficulty to get into R hand splint MAS 3 in R Hamstrings and quads. Crepitus in right knee with spasticity testing.  Sensory exam normal sensation to light touch and proprioception in bilateral upper and lower extremities Cerebellar exam normal finger to nose to finger as well as heel to shin in bilateral upper and lower extremities Musculoskeletal: Full range of motion in all 4 extremities. No joint swelling    Assessment/Plan: 1. Functional deficits secondary to RIght hip fracture due to  fall in a pt with chronic spastic hemi which require 3+ hours per day of interdisciplinary therapy in a comprehensive inpatient rehab setting.  Physiatrist is providing close team supervision and 24 hour management of active medical problems listed below.  Physiatrist and rehab team continue to assess barriers to discharge/monitor patient progress toward functional and medical goals  Care Tool:  Bathing    Body parts bathed by patient: Right arm, Chest, Abdomen, Front perineal area, Right upper leg, Left upper leg, Right lower leg, Left lower leg, Face   Body parts bathed by helper: Left arm, Buttocks Body parts n/a: Left arm, Buttocks   Bathing assist Assist Level: Moderate Assistance - Patient 50 - 74%     Upper Body Dressing/Undressing Upper body dressing   What is the patient wearing?: Pull over shirt    Upper body assist Assist Level: Supervision/Verbal cueing    Lower Body Dressing/Undressing Lower body dressing      What is the patient wearing?: Pants, Incontinence brief     Lower body assist Assist for lower body dressing: Moderate Assistance - Patient 50 - 74%     Toileting Toileting    Toileting assist Assist for toileting: Minimal Assistance - Patient > 75%     Transfers Chair/bed transfer  Transfers assist     Chair/bed transfer assist level: Moderate Assistance - Patient 50 - 74%     Locomotion Ambulation   Ambulation assist   Ambulation activity did not occur: Safety/medical concerns  Assist level: Minimal  Assistance - Patient > 75% Assistive device: Cane-quad Max distance: 25 ft   Walk 10 feet activity   Assist  Walk 10 feet activity did not occur: Safety/medical concerns  Assist level: Minimal Assistance - Patient > 75% Assistive device: Cane-quad   Walk 50 feet activity   Assist Walk 50 feet with 2 turns activity did not occur: Safety/medical concerns         Walk 150 feet activity   Assist Walk 150 feet activity did  not occur: Safety/medical concerns         Walk 10 feet on uneven surface  activity   Assist Walk 10 feet on uneven surfaces activity did not occur: Safety/medical concerns         Wheelchair     Assist Will patient use wheelchair at discharge?: Yes Type of Wheelchair: Manual    Wheelchair assist level: Supervision/Verbal cueing Max wheelchair distance: 136ft    Wheelchair 50 feet with 2 turns activity    Assist        Assist Level: Supervision/Verbal cueing   Wheelchair 150 feet activity     Assist      Assist Level: Minimal Assistance - Patient > 75%   Blood pressure (!) 142/73, pulse 83, temperature (!) 97.5 F (36.4 C), resp. rate 18, height 5\' 7"  (1.702 m), weight 61.1 kg, SpO2 97 %.    Medical Problem List and Plan: 1.Decreased functional mobilitysecondary to right FNF.S/P righthemiarthroplasty anterior approach 01/21/2020. Weightbearing as tolerated. 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 Was at w/c level PTA -patient may shower -ELOS/Goals: supervision to min assist. 14-20 days  -Continue CIR PT, OT.  2. Antithrombotics: -DVT/anticoagulation:Lovenox. Negative vascular study -antiplatelet therapy: resume ASA 325mg  daily 3. Pain Management:limit to tylenol, ice pack for now given AMS. 4. Mood:Prozac 40 mf daily -antipsychotic agents: N/A 5. Neuropsych: This patientiscapable of making decisions on hisown behalf. 6. Skin/Wound Care:Routine skin checks- suture removal ~14d post  7. Fluids/Electrolytes/Nutrition:Routine in and outs with follow-up chemistries 8. Acute blood loss anemia. Follow-up CBC 9. 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  .Pt received botox injections by Dr. Dimas Millin [facility] on 11/30/2019.Still has severe RUE and RLE tone -Patientalsoon baclofen 10  mg in the morning 20 mg nightly. -family to bring in resting WHO and day time splint from ALF -was using w/c predominantly PTA  reduce baclofen and increase tizanidine,add Klonopin qhs and will monitor for sedation  botulinum toxin as OP  10. BPH. Resume Flomax 0.4 mg daily. 11.Hyperlipidemia. Lipitor 12. Constipation. Large cont BM this am 13. Confusion: Likely medication induced. Hydrocodone dc'ed already -would go ahead and hold robaxin too. -low grade temp, WBC normal --- negative  UA, UCX neg thus far  14. Insomnia:  On klonopin for hamstring spasms, this has helped sleep as well  LOS: 8 days A FACE TO Marvin E Sanvi Ehler 02/01/2020, 8:04 AM

## 2020-02-01 NOTE — Progress Notes (Signed)
Occupational Therapy Session Note  Patient Details  Name: Joshua Shannon MRN: [id number] Date of Birth: 20-May-1941  Today's Date: 02/01/2020 OT Individual Time: 1300-1400 OT Individual Time Calculation (min): 60 min    Short Term Goals: Week 1:  OT Short Term Goal 1 (Week 1): Pt will complete LB bathing sit to stand with mod assist for 3 consecutive sessions. OT Short Term Goal 1 - Progress (Week 1): Met OT Short Term Goal 2 (Week 1): Pt will complete LB dressing sit to stand with mod assist for 3 consecutive sessions. OT Short Term Goal 2 - Progress (Week 1): Met OT Short Term Goal 3 (Week 1): Pt will complete toilet transfer stand pivot with quadcane and mod assist. OT Short Term Goal 3 - Progress (Week 1): Not met OT Short Term Goal 4 (Week 1): Pt will complete toilet hygiene and clothing management with mod assist sit to stand. OT Short Term Goal 4 - Progress (Week 1): Met Week 2:  OT Short Term Goal 1 (Week 2): Pt will complete toilet transfer stand pivot with quadcane and mod assist. OT Short Term Goal 2 (Week 2): Pt will complete walk-in shower transfers with mod assist using the [place] for support. OT Short Term Goal 3 (Week 2): Pt will complete LB bathing sit to stand with min assist. OT Short Term Goal 4 (Week 2): Pt will complete LB dressing sit to stand with min assist including AFO and shoes.  Skilled Therapeutic Interventions/Progress Updates:    1:1 Pt received in w/c. Self care retraining with focus on functional transfers, sit to stands, standing balance, bed mobility and management of tone with functional mobility. Pt performed stand pivot transfer bed>w/c with min A - required two sit to stands due to flexor tone in right LE. Pt able to get into supine with min A at a very slow pace. Pt able to come back in to sitting with min A with extra time and A to help with positioning of right LE to help with pain. Pt used quad based cane to transfer back to w/c - with min A again  with extra time. In the dayroom continued practiced with sit to stands (with min A), stepping forwards/ laterally and backwards with cane with control of right LE with min A and focus on management of tone in LE. Pt able to relax right LE to make foot contact  the floor again with time and cues. Focus on functional reach with hand as flat as could to promote elbow and wrist extension. 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

## 2020-02-01 NOTE — Progress Notes (Signed)
Physical Therapy Session Note  Patient Details  Name: Joshua Shannon MRN: [id number] Date of Birth: [birth year]  Today's Date: 02/01/2020 PT Individual Time: 0832-0934 PT Individual Time Calculation (min): 62 min   Short Term Goals: Week 1:  PT Short Term Goal 1 (Week 1): Pt will initiate gait training PT Short Term Goal 2 (Week 1): Pt will perform bed<>chair transfers with mod assist PT Short Term Goal 3 (Week 1): Pt will perform bed mobility supine<>sit with min assist  Skilled Therapeutic Interventions/Progress Updates:   Pt received supine in bed falling asleep while talking on the phone to his daughter. Upon verbal stimulus pt awake but continues to remain drowsy - pt agreeable to therapy session. Pt noted to be incontinent of bladder. Rolling L in bed pt had onset of significant R LE flexor tone with associated pain - therapist provided manual stretching into hip/knee extension for tone management. Bridging in bed while therapist performed max assist LB clothing management and pt able to perform peri-care with set-up assist. Supine in bed donned TED hose max assist and pants mod assist for threading LEs and pulling pants over hips. Supine>sit, HOB partially elevated and using bedrails with min assist for trunk upright. Therapist donned B shoes and R LE AFO max assist. R squat pivot EOB>w/c with mod assist for pivoting hips and therapist providing manual facilitation for R LE weight bearing to decrease tone and maintain R foot contact on ground. Performed L hemi-technique w/c propulsion ~128ft to therapy gym to increase alertness and energy levels because he continues to be drowsy. Pt set-up for R squat pivot transfer to mat with min cuing and min assist for leg rest management. R squat pivot w/c>EOM with min assist for pivoting hips and manual facilitation for R LE weightbearing to decrease tone during transfer. Seated R LE hamstring stretch x1 minute. Dynamic standing balance task of cross body  reaching to place clothespins on basketball goal with primarily CGA for steadying but frequent min/mod assist to prevent posterior LOB when reaching - cuing for R LE extension and R weight shift for weightbearing throughout. R LE long arc quads x10 reps for quad activation and tone management. Gait training ~70ft using QC to perform 2x turns and slight obstacle navigation with varying CGA/min assist for balance due to intermittent posterior lean/LOB - cuing for increased anterior weight shift - therapist inititally assisting with increased R LE step length but with further reps only needed itermittent assist. Transported back to room and left seated in w/c with needs in reach and seat belt alarm on.  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    Therapy/Group: Individual Therapy  Ginny Forth, PT, DPT 02/01/2020, 8:00 AM

## 2020-02-02 ENCOUNTER — Inpatient Hospital Stay (HOSPITAL_COMMUNITY): Payer: Medicare Other

## 2020-02-02 ENCOUNTER — Inpatient Hospital Stay (HOSPITAL_COMMUNITY): Payer: Medicare Other | Admitting: Occupational Therapy

## 2020-02-02 ENCOUNTER — Inpatient Hospital Stay (HOSPITAL_COMMUNITY): Payer: Medicare Other | Admitting: Physical Therapy

## 2020-02-02 MED ORDER — TIZANIDINE HCL 2 MG PO TABS
2.0000 mg | ORAL_TABLET | Freq: Four times a day (QID) | ORAL | Status: DC
  Administered 2020-02-02 – 2020-02-06 (×15): 2 mg via ORAL
  Filled 2020-02-02 (×15): qty 1

## 2020-02-02 NOTE — Progress Notes (Signed)
Occupational Therapy Session Note  Patient Details  Name: Joshua Shannon MRN: [id number] Date of Birth: 1941-02-24  Today's Date: 02/02/2020 OT Individual Time: 0830-0930 OT Individual Time Calculation (min): 60 min    Short Term Goals: Week 1:  OT Short Term Goal 1 (Week 1): Pt will complete LB bathing sit to stand with mod assist for 3 consecutive sessions. OT Short Term Goal 1 - Progress (Week 1): Met OT Short Term Goal 2 (Week 1): Pt will complete LB dressing sit to stand with mod assist for 3 consecutive sessions. OT Short Term Goal 2 - Progress (Week 1): Met OT Short Term Goal 3 (Week 1): Pt will complete toilet transfer stand pivot with quadcane and mod assist. OT Short Term Goal 3 - Progress (Week 1): Not met OT Short Term Goal 4 (Week 1): Pt will complete toilet hygiene and clothing management with mod assist sit to stand. OT Short Term Goal 4 - Progress (Week 1): Met Week 2:  OT Short Term Goal 1 (Week 2): Pt will complete toilet transfer stand pivot with quadcane and mod assist. OT Short Term Goal 2 (Week 2): Pt will complete walk-in shower transfers with mod assist using the [place] for support. OT Short Term Goal 3 (Week 2): Pt will complete LB bathing sit to stand with min assist. OT Short Term Goal 4 (Week 2): Pt will complete LB dressing sit to stand with min assist including AFO and shoes. Week 3:     Skilled Therapeutic Interventions/Progress Updates:    1:1 Pt in bed when arrived. Min A with extra time to come to EOB. Mod A transfer to w/c with increased tone present this morning with moderate difficulty to extend to have foot make contact with floor. Pt transfered to commode with mod A again with significant tone but able to relax LE better with this transfer. Pt able to perform toileting with setup. Performed bathing and dressing at sink level (pt's choice). Continued focus on sit to stands, standing balance, activity tolerance. With progression of session pt able to  maintain foot contact with floor with min A. Pt able to assist with pulling up pants in standing without UE support with min A and cues for maintaining upright posture. See Care tool for more details.  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

## 2020-02-02 NOTE — Progress Notes (Signed)
[place] PHYSICAL MEDICINE & REHABILITATION PROGRESS NOTE   Subjective/Complaints:  Tone overall improved , has Right sided anterior/lateral chest pain since "one of my falls"  Does not recall if had a rib fx in past  CXR report 1/28 "no skeletal abnormalities"   Pt cannot reproduce pain at current time  Per OT stil has significant hamstring tone when standing    ROS-  no abd pain  Or N/V.  No CP or SOB  Objective:   No results found. No results for input(s): WBC, HGB, HCT, PLT in the last 72 hours. Recent Labs    01/31/20 0525  CREATININE 1.06    Intake/Output Summary (Last 24 hours) at 02/02/2020 0855 Last data filed at 02/02/2020 0845 Gross per 24 hour  Intake 410 ml  Output 425 ml  Net -15 ml     Physical Exam: Vital Signs Blood pressure (!) 152/70, pulse 74, temperature 97.9 F (36.6 C), resp. rate 19, height '5\' 7"'  (1.702 m), weight 61.1 kg, SpO2 97 %. General: No acute distress. Sitting up in bedside chair; comfortable except for R hand- rubbing R hand and R leg constantly, NAD  Mood and affect are appropriate Heart: Regular rate and rhythm no rubs murmurs or extra sounds Lungs: Clear to auscultation, breathing unlabored, no rales or wheezes Abdomen: Positive bowel sounds, soft nontender to palpation, nondistended Extremities: No clubbing, cyanosis, or edema Skin: No evidence of breakdown, no evidence of rash Neurologic: Cranial nerves II through XII intact, motor strength is 5/5 in LEFT deltoid, bicep, tricep, grip, hip flexor, knee extensors, ankle dorsiflexor and plantar flexor 0/5 RUE, 0/5 LLE in all muscle groups  Tone- MAS 3 in R elbow flexors, mAS 4 in RIght finger flexors; clawing; difficulty to get into R hand splint MAS 3 in R Hamstrings and quads. Crepitus in right knee with spasticity testing.  Sensory exam normal sensation to light touch and proprioception in bilateral upper and lower extremities Cerebellar exam normal finger to nose to finger as  well as heel to shin in bilateral upper and lower extremities Musculoskeletal: Full range of motion in all 4 extremities. No joint swelling    Assessment/Plan: 1. Functional deficits secondary to RIght hip fracture due to fall in a pt with chronic spastic hemi which require 3+ hours per day of interdisciplinary therapy in a comprehensive inpatient rehab setting.  Physiatrist is providing close team supervision and 24 hour management of active medical problems listed below.  Physiatrist and rehab team continue to assess barriers to discharge/monitor patient progress toward functional and medical goals  Care Tool:  Bathing    Body parts bathed by patient: Right arm, Chest, Abdomen, Front perineal area, Right upper leg, Left upper leg, Right lower leg, Left lower leg, Face   Body parts bathed by helper: Left arm, Buttocks Body parts n/a: Left arm, Buttocks   Bathing assist Assist Level: Moderate Assistance - Patient 50 - 74%     Upper Body Dressing/Undressing Upper body dressing   What is the patient wearing?: Pull over shirt    Upper body assist Assist Level: Supervision/Verbal cueing    Lower Body Dressing/Undressing Lower body dressing      What is the patient wearing?: Pants, Incontinence brief     Lower body assist Assist for lower body dressing: Moderate Assistance - Patient 50 - 74%     Toileting Toileting    Toileting assist Assist for toileting: Minimal Assistance - Patient > 75%     Transfers Chair/bed transfer  Transfers assist     Chair/bed transfer assist level: Moderate Assistance - Patient 50 - 74%     Locomotion Ambulation   Ambulation assist   Ambulation activity did not occur: Safety/medical concerns  Assist level: Minimal Assistance - Patient > 75% Assistive device: Cane-quad Max distance: 78f   Walk 10 feet activity   Assist  Walk 10 feet activity did not occur: Safety/medical concerns  Assist level: Minimal Assistance -  Patient > 75% Assistive device: Cane-quad   Walk 50 feet activity   Assist Walk 50 feet with 2 turns activity did not occur: Safety/medical concerns  Assist level: Minimal Assistance - Patient > 75% Assistive device: Cane-quad    Walk 150 feet activity   Assist Walk 150 feet activity did not occur: Safety/medical concerns         Walk 10 feet on uneven surface  activity   Assist Walk 10 feet on uneven surfaces activity did not occur: Safety/medical concerns         Wheelchair     Assist Will patient use wheelchair at discharge?: Yes Type of Wheelchair: Manual    Wheelchair assist level: Supervision/Verbal cueing Max wheelchair distance: 1553f   Wheelchair 50 feet with 2 turns activity    Assist        Assist Level: Supervision/Verbal cueing   Wheelchair 150 feet activity     Assist      Assist Level: Supervision/Verbal cueing   Blood pressure (!) 152/70, pulse 74, temperature 97.9 F (36.6 C), resp. rate 19, height '5\' 7"'  (1.702 m), weight 61.1 kg, SpO2 97 %.    Medical Problem List and Plan: 1.Decreased functional mobilitysecondary to right FNF.S/P righthemiarthroplasty anterior approach 01/21/2020. Weightbearing as tolerated. 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 Was at w/c level PTA   -Continue CIR PT, OT.  Team conference today please see physician documentation under team conference tab, met with team  to discuss problems,progress, and goals. Formulized individual treatment plan based on medical history, underlying problem and comorbidities. 2. Antithrombotics: -DVT/anticoagulation:Lovenox. Negative vascular study -antiplatelet therapy: resume ASA 32553maily 3. Pain Management:limit to tylenol, ice pack for now given AMS. 4. Mood:Prozac 40 mf daily -antipsychotic agents: N/A 5. Neuropsych: This patientiscapable of making decisions on  hisown behalf. 6. Skin/Wound Care:Routine skin checks- suture removal ~14d post  7. Fluids/Electrolytes/Nutrition:Routine in and outs with follow-up chemistries 8. Acute blood loss anemia. Follow-up CBC 9. 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  .[facility].Still has severe RUE and RLE tone -Patientalsoon baclofen 10 mg in the morning 20 mg nightly. -family to bring in resting WHO and day time splint from ALF -was using w/c predominantly PTA  DC  baclofen and increase tizanidine,add Klonopin qhs, no evidence  for sedation  botulinum toxin as OP  10. BPH. Resume Flomax 0.4 mg daily. 11.Hyperlipidemia. Lipitor 12. Constipation. Large cont BM this am 13. Confusion: Likely medication induced. Hydrocodone dc'ed already -would go ahead and hold robaxin too. -low grade temp, WBC normal --- negative  UA, UCX neg thus far  14. Insomnia:  On klonopin for hamstring spasms, this has helped sleep as well  LOS: 9 days A FACE TO FACE EVALUATION WAS PERFORMED  AndCharlett Blake10/2021, 8:55 AM

## 2020-02-02 NOTE — Plan of Care (Signed)
  Problem: Consults Goal: RH GENERAL PATIENT EDUCATION Description: See Patient Education module for education specifics. Outcome: Progressing Goal: Skin Care Protocol Initiated - if Braden Score 18 or less Description: If consults are not indicated, leave blank or document N/A Outcome: Progressing   Problem: RH BLADDER ELIMINATION Goal: RH STG MANAGE BLADDER WITH ASSISTANCE Description: STG Manage Bladder With min Assistance Outcome: Progressing   Problem: RH SKIN INTEGRITY Goal: RH STG SKIN FREE OF INFECTION/BREAKDOWN Description: Pt will cont to be free of skin breakdown or infection during CIR stay with supervision assist Outcome: Progressing Goal: RH STG MAINTAIN SKIN INTEGRITY WITH ASSISTANCE Description: STG Maintain Skin Integrity With supervision Assistance. Outcome: Progressing Goal: RH STG ABLE TO PERFORM INCISION/WOUND CARE W/ASSISTANCE Description: STG Able To Perform Incision/Wound Care With min Assistance. Outcome: Progressing   Problem: RH SAFETY Goal: RH STG ADHERE TO SAFETY PRECAUTIONS W/ASSISTANCE/DEVICE Description: STG Adhere to Safety Precautions With cues/reminders Assistance/Device. Outcome: Progressing   

## 2020-02-02 NOTE — Progress Notes (Signed)
Occupational Therapy Session Note  Patient Details  Name: Joshua Shannon MRN: [id number] Date of Birth: Mar 04, 1941  Today's Date: 02/02/2020 OT Individual Time: 1445-1515 OT Individual Time Calculation (min): 30 min    Short Term Goals: Week 2:  OT Short Term Goal 1 (Week 2): Pt will complete toilet transfer stand pivot with quadcane and mod assist. OT Short Term Goal 2 (Week 2): Pt will complete walk-in shower transfers with mod assist using the [place] for support. OT Short Term Goal 3 (Week 2): Pt will complete LB bathing sit to stand with min assist. OT Short Term Goal 4 (Week 2): Pt will complete LB dressing sit to stand with min assist including AFO and shoes.  Skilled Therapeutic Interventions/Progress Updates:    Pt received supine, agreeable to OT session requesting to work on functional mobility. Pt completed bed mobility to EOB with min A for managing RLE with flexor tone. Pt required min A to stand from slightly elevated EOB. As much as mod A required initially for pt to break flexor tone and maintain standing balance. Pt was then able to complete 75 ft of functional mobility with quad cane in his LUE with CGA-min A. Pt occasionally required min facilitation for swing through with cueing for increasing BOS. Pt took a seated rest break and his RUE was passively stretches to reduce tone/maintain elasticity. Pt returned to his room in a similar fashion and was left supine with all needs met.   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

## 2020-02-02 NOTE — Patient Care Conference (Signed)
Inpatient RehabilitationTeam Conference and Plan of Care Update Date: 02/02/2020   Time: 10:30 AM    Patient Name: Joshua Shannon      Medical Record Number: [id number]  Date of Birth: [birth year] Sex: Male         Room/Bed: 4W13C/4W13C-01 Payor Info: Payor: MEDICARE / Plan: MEDICARE PART A AND B / Product Type: *No Product type* /    Admit Date/Time:  01/24/2020  4:44 PM  Primary Diagnosis:  Right femoral fracture [facility])  Patient Active Problem List   Diagnosis Date Noted  . Right femoral fracture ([place]) 01/24/2020  . Closed displaced fracture of right femoral neck ([place]) 01/20/2020  . Fall at home, initial encounter 01/20/2020    Expected Discharge Date: Expected Discharge Date: 02/11/20  Team Members Present: Physician leading conference: Dr. Alysia Penna Social Worker Present: Lennart Pall, LCSW Nurse Present: Dorien Chihuahua, RN;Other (comment)(Marie Poynter, RN) Case Manager: Karene Fry, RN PT Present: Michaelene Song, PT OT Present: [name], OT SLP Present: Jettie Booze, CF-SLP PPS Coordinator present : Ileana Ladd, Burna Mortimer, SLP     Current Status/Progress Goal Weekly Team Focus  Bowel/Bladder   cont with som einc episodes, LBM 02/01/20  less cont episodes   timed toilette   Swallow/Nutrition/ Hydration   Reg/thin, no straws, Supervision A  Supervision A  goals met   ADL's   UB bathing at min assist with UB dressing with supervision.  Mod assist for LB bathing and dressing sit to stand.  Mod assist for transfers squat pivot with mod to max for stand pivot.  Increased tone in the RUE from previous CVA  min assist overall  selfcare retraining, balance retraining, transfer training, NMES, therapeutic activites, pt/family education, splinting.   Mobility   CGA-minA bed mobility, min assist squat/stand pivot to the L, min/mod assist sit<>stand with quad cane (facilitation for R LE placement/weightbearing & anterior weight shift), mod assist stand pivot transfers  to the L, mod until tone dec after ~5 steps progressed to min A gait 63f w/ QC +2 w/c follow  CGA  gait, standing balance, R LE tone management, transfers   Communication             Safety/Cognition/ Behavioral Observations  Supervision-Min A  Supervision-Mod A  goals met - back to baseline   Pain   currently no c/o of pain   maintain pain   asses pain q shift and q shift   Skin   right hip incision, recchymosis BUE  no new areas of skin break down  Assess q shift and prn    Rehab Goals Patient on target to meet rehab goals: Yes *See Care Plan and progress notes for long and short-term goals.     Barriers to Discharge  Current Status/Progress Possible Resolutions Date Resolved   Nursing                  PT                    OT                  SLP                SW Decreased caregiver support;Lack of/limited family support [place] [facility] can go to [facility] if necessary/unable to meet ALF functional requirements          Discharge Planning/Teaching Needs:  Plan to discharge back to [facility]  TBD   Team Discussion: R chronic hemiplegia, sutures to be removed Friday, gets botox at [facility], not due until March, med adjustments made.  RN cont/inc B/B, brace R hand.  OT tone in am, needs mod A, R leg withdraws, overall mod A transfers, min A LB/S UB B/D, goals min A.  PT min/mod transfers, min/mod gait, goals S/CGA.  SLP DC'd yesterday, at baseline.   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, suture removal this week Weekly Focus/Goal: spasticity management , switching baclofen to tizanidine  Barriers to Discharge: Medical stability;Wound care   Possible Resolutions to Barriers: See above   Continued Need for Acute Rehabilitation Level of Care: The patient requires daily medical management by a physician with specialized training in physical medicine and  rehabilitation for the following reasons: Direction of a multidisciplinary physical rehabilitation program to maximize functional independence : Yes Medical management of patient stability for increased activity during participation in an intensive rehabilitation regime.: Yes Analysis of laboratory values and/or radiology reports with any subsequent need for medication adjustment and/or medical intervention. : Yes   I attest that I was present, lead the team conference, and concur with the assessment and plan of the team.   Jodell Cipro M 02/02/2020, 8:34 PM   Team conference was held via web/ teleconference due to [place] - 19

## 2020-02-02 NOTE — Progress Notes (Signed)
Physical Therapy Session Note  Patient Details  Name: Joshua Shannon MRN: [id number] Date of Birth: 1941/02/11  Today's Date: 02/02/2020 PT Individual Time: 1030-1130 PT Individual Time Calculation (min): 60 min   Short Term Goals: Week 1:  PT Short Term Goal 1 (Week 1): Pt will initiate gait training PT Short Term Goal 1 - Progress (Week 1): Met PT Short Term Goal 2 (Week 1): Pt will perform bed<>chair transfers with mod assist PT Short Term Goal 2 - Progress (Week 1): Met PT Short Term Goal 3 (Week 1): Pt will perform bed mobility supine<>sit with min assist PT Short Term Goal 3 - Progress (Week 1): Met Week 2:  PT Short Term Goal 1 (Week 2): STG = LTGs  Skilled Therapeutic Interventions/Progress Updates:   Pt received sitting in WC and agreeable to PT. WC mobility with supervision assist using L hemi technique 2 x 18f throughout unit.   PT performed HS stretch to the R LE seated in WC for tone management 3 x 1 minute with oscilation to muscle belly intermittently    Sit<>stand with max assist initiatally due to continued tone in the RLE. Progressing to min-mod assist to facilitate improved WB through RLE to minimize HS tone. Throughout treatment, pt performed sit<>stand with varying degrees of assist due to inconsistent flexor tone response, from min-max assist.   Gait training with QC x [id number] max assist initially progressing to mod assist as flexor tone/adductor tone decreased with increased distance. Continued to require tactile cues to stimulate knee extension in stance on the RLE as well as occasional assist to prevent posterior LOB.   Dynamic standing balance to perform lateral Reach to the L to place and remove 8 clothes pins. Min-mod assist to prevent flexor tone in the RLE as well as multimodal cues to improve terminal knee extension on the R to improve L weight shift.   Nustep reciprocal movement training x 5 minutes. BLE only, mod assist initially on the RLE to  counter act tone, progressing to min assist to stabilize the RLE on foot rest.   Patient returned to room and left sitting in [facility] call bell in reach and all needs met.       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

## 2020-02-02 NOTE — Progress Notes (Signed)
Physical Therapy Weekly Progress Note  Patient Details  Name: Joshua Shannon MRN: [id number] Date of Birth: 08/31/1941  Beginning of progress report period: January 25, 2020 End of progress report period: February 02, 2020  Today's Date: 02/02/2020 PT Individual Time: 6734-1937 PT Individual Time Calculation (min): 71 min    Patient has met 3 of 3 short term goals.  Patient demonstrated good progress towards functional mobility goals. He participates in gait training and has ambulated up to 59 ft with QC +2 w/c follow for safety. Pt requires facilitation for RLE placement/WB and closed chain DF to facilitate proper gait mechanics. Pt is min-modA for transfers and progressing.   Patient continues to demonstrate the following deficits muscle weakness, abnormal tone and decreased standing balance and decreased balance strategies and therefore will continue to benefit from skilled PT intervention to increase functional independence with mobility.   Patient progressing toward long term goals..  Continue plan of care.  PT Short Term Goals Week 1:  PT Short Term Goal 1 (Week 1): Pt will initiate gait training PT Short Term Goal 1 - Progress (Week 1): Met PT Short Term Goal 2 (Week 1): Pt will perform bed<>chair transfers with mod assist PT Short Term Goal 2 - Progress (Week 1): Met PT Short Term Goal 3 (Week 1): Pt will perform bed mobility supine<>sit with min assist PT Short Term Goal 3 - Progress (Week 1): Met Week 2:  PT Short Term Goal 1 (Week 2): STG = LTGs  Skilled Therapeutic Interventions/Progress Updates:    Patient seated in w/c upon PT arrival, agreeable to therapy tx, denies pain today. Pt was trying to adjust GRAFO and stated it wasn't tight enough, therapist assist to secure brace. Therapist transported pt to rehab gym in w/c for time management. While sitting in w/c, therapist performed RUE stretching for elbow, wrist and finger extension, pt stated minimal pain with wrist  extension but tolerated well overall. Sitting in w/c>standing in front of mat table minA > tall kneeling on mat table modA for RLE assist and weight shifting, cues for sequencing, technique, and hand placement. Once in tall kneeling with bench in front of pt for UE support, therapist continued to stretch RUE for optimal placement on bench to facilitate increased weight bearing through RUE. In tall kneeling, pt performed the following exercises: reaching with LUE across midline for horseshoes at various heights/angles and placing next to self x 10 reps, reaching with LUE to L and places contralateral x 5 reps, side stepping x 5 steps in each direction with min-modA for RLE management. Pt required seated rest break in w/c after reaching exercise before side stepping and got off mat table with modA and given cues as previously stated to get on mat. Pt transferred onto mat table again same as before, once in tall kneeling therapist removed bench to transfer pt into quadruped with minA for positioning and RUE management. Pt initially reported pain in RLE, therapist assisted weight shift over to L, once pain dissipated <15 seconds, weight shifted over to R for equal weight bearing and pt reported no pain. Therapist maxA to position RUE into extension, lacking approx. 25 deg elbow extension and brace to prevent collapsing d/t flexor tone. Pt performed R/L weight shifting x 5 each direction. Pt significantly fatigued requiring rest break. Transfer pt to sidelying on L side with modA. Attempted clamshells after demonstration and cueing on RLE, however pt unable to perform movement actively independently, unable to active with assist and pt  demonstrated significant adductor resistance. Therapist then performed RLE stretching for hip flexors and hamstrings while pt in L sidelying d/t c/o cramping, pt reported resolution of cramping pain with stretching. ModA to assist pt from L sidelying > sitting EOM with LE management and  upright posture. Sitting EOM>standing with QC requiring modA blocking R knee, d/t significant RLE flexor tone, facilitated weight shifting in standing to dec tone, cued pt to push knee back into therapist's hand which helped him extend his R knee for more stability and improved posture in standing. Pt performed x 3 side steps with the LLE only while RLE remained stance LE to facilitate R abductor muscle activation. Pt's flexor tone too significant requiring mod-maxA from therapist to block R knee and facilitate weight shift while prevention posterior LOB d/t significantly forward flexed trunk. Standing>sitting EOM with minA, therapist continued to stretch R HS and then had pt perform active assisted LAQ for RLE x 5 reps; with each repetition, pt's quad muscle activation improved requiring less assistance form therapist. Sitting EOM>standing with QC requiring minA for input to RLE to extend. In standing, pt performed LLE only side steps again x 5 reps with improved control, posture, and ability to extend R knee with cues. Pt ambulated 5 ft with minA and QC while therapist moved w/c behind pt to sit d/t significant fatigue. Pt transported back to room in w/c at end of session. Attempted stand pivot transfer to bed going to R side, however too difficulty for pt d/t weakness and tone. Stand pivot to L> bed with minA to block R knee. Pt seated at EOB and stated "I need to pee", RN in room at time to give medication, stated that she would help pt to bathroom. Pt left seated EOB with RN present to continue care needs.   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

## 2020-02-03 ENCOUNTER — Inpatient Hospital Stay (HOSPITAL_COMMUNITY): Payer: Medicare Other | Admitting: Occupational Therapy

## 2020-02-03 ENCOUNTER — Inpatient Hospital Stay (HOSPITAL_COMMUNITY): Payer: Medicare Other

## 2020-02-03 ENCOUNTER — Inpatient Hospital Stay (HOSPITAL_COMMUNITY): Payer: Medicare Other | Admitting: Physical Therapy

## 2020-02-03 NOTE — Care Management (Signed)
Patient ID: Joshua Shannon, male   DOB: Apr 23, 1941, 79 y.o.   MRN: [id number] Contacted Joshua Shannon at [facility] regarding pending discharge. ALF is "not equipped to manage patient at expected discharge level Minimal assistance-Supervision level" and because of recent hospitalization, he will need to quarantine for a period upon return before returning to ALF section, so he will need to go to the SNF portion of the facility. Updated bed request for SNF. Wife "holding" a bed at the ALF, will need to discuss continued holding in ALF or cancel hold on the bed for now. Await information from [place] at [facility] and discussion with patient's wife.

## 2020-02-03 NOTE — Progress Notes (Signed)
Physical Therapy Session Note  Patient Details  Name: Joshua Shannon MRN: [id number] Date of Birth: [birth year]  Today's Date: 02/03/2020 PT Individual Time: 1497-0263 PT Individual Time Calculation (min): 77 min   Short Term Goals: Week 2:  PT Short Term Goal 1 (Week 2): STG = LTGs  Skilled Therapeutic Interventions/Progress Updates:    Pt received sitting in w/c reporting some fatigue from earlier therapy sessions but agreeable to this session. Performed L hemi-technique w/c propulsion ~149ft to therapy gym with distant supervision. Pt able to set-up for L transfer w/c>EOM with pt reporting plans to do a lateral scoot/squat pivot - mod cuing for scooting hips forward in chair prior to initiating the transfer and min assist for balance and pivoting hips onto mat. Block practice squat/semi-stand pivot transfers w/c<>EOM with min assist for balance and pivoting hips as pt demonstrates poor trunk/pelvic control to pivot hips - mod/max cuing for sequencing and technique to maintain R LE weightbearing to keep foot on floor despite flexor tone. Seated L LE long arc quads x12 reps to decrease tone and promote increased quad muscle activation. Reviewed sit<>stand techniques learned in prior therapy sessions to promote increased R LE weight bearing to lessen tone and keep foot flat on floor once in standing as well as to increase anterior trunk weightshift to decrease posterior lean once in standing all with CGA/min assist - mirror feedback and cuing to keep hips towards the R with manual facilitation as needed. Standing with QC performed R lateral weight shifts while stepping L LE forward/backwards and laterally in/out targeting R LE weight bearing with focus on sustained R knee extension and decreased compensation through L UE support on QC. Gait training 143ft using QC (including 4 turns) with min assist for balance due to a few instances of posterior lean and for balance while turning as well as min assist for  R LE advancement during swing - cuing for increased L step length to promote increased R LE stance time and weight bearing. Standing with QC performed R LE NMR targeting increased knee extension during terminal swing via heel tapping to external target with pt demonstrating some improvement with increased repetitions. Transported to day room. Squat/semi-stand pivot w/c<>Nustep with mod assist for pivoting hips and balance. Performed B LE reciprocal pattern on Nustep targeting R LE quad muscle strengthening against level 4 resistance for 5 minutes (1 rest) totaling 48steps - manual facilitation and cuing for improved R LE knee alignment to avoid hip internal rotation and adduction upon coming into flexion. Transported back to main gym. Performed lateral side stepping in // bars with min assist for balance and R LE management with max multimodal cuing for R knee extension and R lateral weight shift prior to stepping L LE to decrease compensation of weightbearing through L UE support on bar. Transported back to room in w/c and left seated with seat belt alarm on and RN present to assume care of patient.  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   Therapy/Group: Individual Therapy  Ginny Forth, PT, DPT 02/03/2020, 4:38 PM

## 2020-02-03 NOTE — NC FL2 (Signed)
[place] MEDICAID FL2 LEVEL OF CARE SCREENING TOOL     IDENTIFICATION  Patient Name: Joshua Shannon Birthdate: [birth year] Sex: male Admission Date (Current Location): 01/24/2020  [facility] and [place] Number:  Producer, television/film/video and Address:  [facility], [address] [place], [place] [address]      Provider Number:    Attending Physician Name and Address:  Erick Colace, MD  Relative Name and Phone Number:  Vernard Gram [phone]    Current Level of Care: Hospital Recommended Level of Care: Assisted Living Facility Prior Approval Number:    Date Approved/Denied:   PASRR Number: [id number]  Discharge Plan: SNF    Current Diagnoses: Patient Active Problem List   Diagnosis Date Noted  . Right femoral fracture (HCC) 01/24/2020  . Closed displaced fracture of right femoral neck (HCC) 01/20/2020  . Fall at home, initial encounter 01/20/2020    Orientation RESPIRATION BLADDER Height & Weight     Situation, Place  Normal Continent Weight: 61.1 kg Height:  5\' 7"  (170.2 cm)  BEHAVIORAL SYMPTOMS/MOOD NEUROLOGICAL BOWEL NUTRITION STATUS      Continent Diet  AMBULATORY STATUS COMMUNICATION OF NEEDS Skin   (Supervision-CGA) Verbally Normal, Bruising                       Personal Care Assistance Level of Assistance  Bathing, Dressing Bathing Assistance: Limited assistance   Dressing Assistance: Limited assistance     Functional Limitations Info             SPECIAL CARE FACTORS FREQUENCY  PT (By licensed PT), OT (By licensed OT)     PT Frequency: 5x/week OT Frequency: 5x/week            Contractures Contractures Info: Not present    Additional Factors Info                  Current Medications (02/03/2020):  This is the current hospital active medication list Current Facility-Administered Medications  Medication Dose Route Frequency Provider Last Rate Last Admin  . acetaminophen (TYLENOL) tablet 500 mg   500 mg Oral Q4H PRN Lovorn, Megan, MD   500 mg at 01/30/20 2259  . aspirin tablet 325 mg  325 mg Oral Daily 2260, PA-C   325 mg at 02/03/20 0756  . atorvastatin (LIPITOR) tablet 40 mg  40 mg Oral q1800 04/02/20, PA-C   40 mg at 02/02/20 1741  . clonazePAM (KLONOPIN) disintegrating tablet 0.25 mg  0.25 mg Oral QHS Kirsteins, 04/01/20, MD   0.25 mg at 02/02/20 2100  . enoxaparin (LOVENOX) injection 40 mg  40 mg Subcutaneous Q24H 2101, PA-C   40 mg at 02/02/20 0949  . eye wash ((SODIUM/POTASSIUM/SOD CHLORIDE)) ophthalmic solution 1 drop  1 drop Both Eyes BID 04/01/20, PA-C   1 drop at 02/03/20 0758  . FLUoxetine (PROZAC) capsule 40 mg  40 mg Oral Daily 04/02/20, PA-C   40 mg at 02/03/20 0756  . magnesium oxide (MAG-OX) tablet 400 mg  400 mg Oral Daily 04/02/20, PA-C   400 mg at 02/03/20 0756  . ondansetron (ZOFRAN) tablet 4 mg  4 mg Oral Q6H PRN Angiulli, 04/02/20, PA-C       Or  . ondansetron (ZOFRAN) injection 4 mg  4 mg Intravenous Q6H PRN Angiulli, Mcarthur Rossetti, PA-C      . polyethylene glycol (MIRALAX / GLYCOLAX)  packet 17 g  17 g Oral Daily Raulkar, Clide Deutscher, MD   17 g at 02/03/20 0756  . sorbitol 70 % solution 30 mL  30 mL Oral Daily PRN Angiulli, Lavon Paganini, PA-C      . sorbitol 70 % solution 30 mL  30 mL Oral Daily PRN Angiulli, Lavon Paganini, PA-C      . tamsulosin [facility]) capsule 0.4 mg  0.4 mg Oral Daily Cathlyn Parsons, PA-C   0.4 mg at 02/03/20 0756  . tiZANidine (ZANAFLEX) tablet 2 mg  2 mg Oral 4x daily Kirsteins, Luanna Salk, MD   2 mg at 02/02/20 2100  . traMADol-acetaminophen (ULTRACET) 37.5-325 MG per tablet 1 tablet  1 tablet Oral Q4H PRN Kirsteins, Luanna Salk, MD   1 tablet at 02/03/20 0108     Discharge Medications: Please see discharge summary for a list of discharge medications.  Relevant Imaging Results:  Relevant Lab Results:   Additional Information Tone right side upper and lower extremity  Hervey Ard,  Dalbert Batman, RN

## 2020-02-03 NOTE — Progress Notes (Signed)
Occupational Therapy Session Note  Patient Details  Name: Joshua Shannon MRN: [id number] Date of Birth: 1941/11/18  Today's Date: 02/03/2020 OT Individual Time: 1432-1550 OT Individual Time Calculation (min): 78 min    Short Term Goals: Week 2:  OT Short Term Goal 1 (Week 2): Pt will complete toilet transfer stand pivot with quadcane and mod assist. OT Short Term Goal 2 (Week 2): Pt will complete walk-in shower transfers with mod assist using the quadcane for support. OT Short Term Goal 3 (Week 2): Pt will complete LB bathing sit to stand with min assist. OT Short Term Goal 4 (Week 2): Pt will complete LB dressing sit to stand with min assist including AFO and shoes.  Skilled Therapeutic Interventions/Progress Updates:    Pt completed supine to sit EOB with min assist to start session.  He was then able to work on donning his shoes and the right AFO.  He was able to donn the left with supervision including tying, but did exhibit increased difficulty with this.  Mod assist was needed for donning the right shoe and AFO.  Therapist introduced shoe buttons with pt and applied them to both shoes.  He was able to fasten both of them with supervision.  He then completed short distance mobility to the wheelchair with mod assist and use of the quad cane.  Increased flexor tone was noted in the RUE, with therapist having to provide mod assist to place it with stepping, as well as mod assist for stabilization at the knee. Therapist took him down to the therapy gym where he completed stand pivot with mod assist to the mat.  Therapist removed the right resting hand splint and applied foam paddle for increased stretching of digit flexors for weightbearing tasks.  Had pt place RUE on therapy mat and with therapist providing mod facilitation at the elbow, had him work on hip hike on the right side while attempting to weightbear into the surface.  He was able to complete right trunk shortening and progress to  reciprical scooting with mod facilitation.  Had him work on simple trunk flexion with head flexed toward the RUE to increase weightbearing through the RLE and the RUE as pre-requisite to standing.  Emphasized slow quality movements with transitions forward in order to increase weightbearing through the right side and help normalize movement and decrease tone.  Finished session with transfer back to the wheelchair squat pivot with emphasis on RLE weightbearing with min assist.  Finished, session with pt sitting up in the wheelchair with call button and phone in reach and safety belt in place.    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

## 2020-02-03 NOTE — Progress Notes (Signed)
[facility] PHYSICAL MEDICINE & REHABILITATION PROGRESS NOTE   Subjective/Complaints:  No new issues this am, discussed spasticity with OT.     ROS-  no abd pain  Or N/V.  No CP or SOB  Objective:   No results found. No results for input(s): WBC, HGB, HCT, PLT in the last 72 hours. No results for input(s): NA, K, CL, CO2, GLUCOSE, BUN, CREATININE, CALCIUM in the last 72 hours.  Intake/Output Summary (Last 24 hours) at 02/03/2020 0810 Last data filed at 02/03/2020 0645 Gross per 24 hour  Intake 610 ml  Output 500 ml  Net 110 ml     Physical Exam: Vital Signs Blood pressure (!) 133/59, pulse 65, temperature 98 F (36.7 C), temperature source Oral, resp. rate 18, height 5\' 7"  (1.702 m), weight 61.1 kg, SpO2 98 %. General: No acute distress. Sitting up in bedside chair; comfortable except for R hand- rubbing R hand and R leg constantly, NAD  Mood and affect are appropriate Heart: Regular rate and rhythm no rubs murmurs or extra sounds Lungs: Clear to auscultation, breathing unlabored, no rales or wheezes Abdomen: Positive bowel sounds, soft nontender to palpation, nondistended Extremities: No clubbing, cyanosis, or edema Skin: No evidence of breakdown, no evidence of rash Neurologic: Cranial nerves II through XII intact, motor strength is 5/5 in LEFT deltoid, bicep, tricep, grip, hip flexor, knee extensors, ankle dorsiflexor and plantar flexor 0/5 RUE, 0/5 LLE in all muscle groups  Tone- MAS 3 in R elbow flexors, mAS 4 in RIght finger flexors; clawing; difficulty to get into R hand splint MAS 3 in R Hamstrings and quads. Crepitus in right knee with spasticity testing.  Sensory exam normal sensation to light touch and proprioception in bilateral upper and lower extremities Cerebellar exam normal finger to nose to finger as well as heel to shin in bilateral upper and lower extremities Musculoskeletal: Full range of motion in all 4 extremities. No joint swelling     Assessment/Plan: 1. Functional deficits secondary to RIght hip fracture due to fall in a pt with chronic spastic hemi which require 3+ hours per day of interdisciplinary therapy in a comprehensive inpatient rehab setting.  Physiatrist is providing close team supervision and 24 hour management of active medical problems listed below.  Physiatrist and rehab team continue to assess barriers to discharge/monitor patient progress toward functional and medical goals  Care Tool:  Bathing    Body parts bathed by patient: Right arm, Chest, Abdomen, Front perineal area, Right upper leg, Left upper leg, Right lower leg, Left lower leg, Face   Body parts bathed by helper: Left arm, Buttocks Body parts n/a: Left arm, Buttocks   Bathing assist Assist Level: Minimal Assistance - Patient > 75%     Upper Body Dressing/Undressing Upper body dressing   What is the patient wearing?: Pull over shirt    Upper body assist Assist Level: Supervision/Verbal cueing    Lower Body Dressing/Undressing Lower body dressing      What is the patient wearing?: Pants, Incontinence brief     Lower body assist Assist for lower body dressing: Moderate Assistance - Patient 50 - 74%     Toileting Toileting    Toileting assist Assist for toileting: Minimal Assistance - Patient > 75%     Transfers Chair/bed transfer  Transfers assist     Chair/bed transfer assist level: Minimal Assistance - Patient > 75%     Locomotion Ambulation   Ambulation assist   Ambulation activity did not occur: Safety/medical  concerns  Assist level: Minimal Assistance - Patient > 75% Assistive device: Cane-quad Max distance: 5 ft   Walk 10 feet activity   Assist  Walk 10 feet activity did not occur: Safety/medical concerns  Assist level: Minimal Assistance - Patient > 75% Assistive device: Cane-quad   Walk 50 feet activity   Assist Walk 50 feet with 2 turns activity did not occur: Safety/medical  concerns  Assist level: Minimal Assistance - Patient > 75% Assistive device: Cane-quad    Walk 150 feet activity   Assist Walk 150 feet activity did not occur: Safety/medical concerns         Walk 10 feet on uneven surface  activity   Assist Walk 10 feet on uneven surfaces activity did not occur: Safety/medical concerns         Wheelchair     Assist Will patient use wheelchair at discharge?: Yes Type of Wheelchair: Manual    Wheelchair assist level: Supervision/Verbal cueing Max wheelchair distance: 181ft    Wheelchair 50 feet with 2 turns activity    Assist        Assist Level: Supervision/Verbal cueing   Wheelchair 150 feet activity     Assist      Assist Level: Supervision/Verbal cueing   Blood pressure (!) 133/59, pulse 65, temperature 98 F (36.7 C), temperature source Oral, resp. rate 18, height 5\' 7"  (1.702 m), weight 61.1 kg, SpO2 98 %.    Medical Problem List and Plan: 1.Decreased functional mobilitysecondary to right FNF.S/P righthemiarthroplasty anterior approach 01/21/2020. Weightbearing as tolerated. 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 Was at w/c level PTA   -Continue CIR PT, OT. ELOS 2/19 ALF vs SNF portion of [facility]  2. Antithrombotics: -DVT/anticoagulation:Lovenox. Negative vascular study -antiplatelet therapy: resume ASA 325mg  daily 3. Pain Management:limit to tylenol, ice pack for now given AMS. 4. Mood:Prozac 40 mf daily -antipsychotic agents: N/A 5. Neuropsych: This patientiscapable of making decisions on hisown behalf. 6. Skin/Wound Care:Routine skin checks- suture removal ~14d post  7. Fluids/Electrolytes/Nutrition:Routine in and outs with follow-up chemistries 8. Acute blood loss anemia. Follow-up CBC 9. 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  .Pt received  [facility] on 11[facility]30/2019.Still has severe RUE and RLE tone -Patientalsoon baclofen 10 mg in the morning 20 mg nightly. -family to bring in resting WHO and day time splint from ALF -was using w/c predominantly PTA  DC  baclofen and increase tizanidine,add Klonopin qhs, no evidence  for sedation  botulinum toxin as OP  Will wait another 2-3 days before further increase to tizanidine  10. BPH. Resume Flomax 0.4 mg daily. 11.Hyperlipidemia. Lipitor 12. Constipation. Large cont BM this am 13. Confusion: Likely medication induced. Hydrocodone dc'ed already -would go ahead and hold robaxin too. -low grade temp, WBC normal --- negative  UA, UCX neg thus far  14. Insomnia:  On klonopin for hamstring spasms, this has helped sleep as well  LOS: 10 days A FACE TO FACE EVALUATION WAS PERFORMED  Charlett Blake 02/03/2020, 8:10 AM

## 2020-02-03 NOTE — Plan of Care (Signed)
  Problem: Consults Goal: RH GENERAL PATIENT EDUCATION Description: See Patient Education module for education specifics. Outcome: Progressing Goal: Skin Care Protocol Initiated - if Braden Score 18 or less Description: If consults are not indicated, leave blank or document N/A Outcome: Progressing   Problem: RH BLADDER ELIMINATION Goal: RH STG MANAGE BLADDER WITH ASSISTANCE Description: STG Manage Bladder With min Assistance Outcome: Progressing   Problem: RH SKIN INTEGRITY Goal: RH STG SKIN FREE OF INFECTION/BREAKDOWN Description: Pt will cont to be free of skin breakdown or infection during CIR stay with supervision assist Outcome: Progressing Goal: RH STG MAINTAIN SKIN INTEGRITY WITH ASSISTANCE Description: STG Maintain Skin Integrity With supervision Assistance. Outcome: Progressing Goal: RH STG ABLE TO PERFORM INCISION/WOUND CARE W/ASSISTANCE Description: STG Able To Perform Incision/Wound Care With min Assistance. Outcome: Progressing   Problem: RH SAFETY Goal: RH STG ADHERE TO SAFETY PRECAUTIONS W/ASSISTANCE/DEVICE Description: STG Adhere to Safety Precautions With cues/reminders Assistance/Device. Outcome: Progressing   

## 2020-02-03 NOTE — Progress Notes (Signed)
Team Conference Report to Patient/Family  Team Conference discussion was reviewed with the patient and caregiver(wife), including goals, any changes in plan of care and target discharge date.  Wife/caregiver expressed understanding and are in agreement.  The patient has a target discharge date of 02/11/20. Discussed with wife plans to discharge the patient back to the SNF portion of [facility] for a 14 day quarantine period where he will continue his rehab and then transition back to the ALF once cleared. Discussed process for discharge with the receiving facility(ALF/SNF) and they are aware of the planned discharge date. FL2/bed request at [facility] is pending approval. Chana Bode B 02/03/2020, 1:11 PM

## 2020-02-03 NOTE — Progress Notes (Signed)
Physical Therapy Session Note  Patient Details  Name: Joshua Shannon MRN: [id number] Date of Birth: January 11, 1941  Today's Date: 02/03/2020 PT Individual Time: 1000-1100 PT Individual Time Calculation (min): 60 min   Short Term Goals: Week 1:  PT Short Term Goal 1 (Week 1): Pt will initiate gait training PT Short Term Goal 1 - Progress (Week 1): Met PT Short Term Goal 2 (Week 1): Pt will perform bed<>chair transfers with mod assist PT Short Term Goal 2 - Progress (Week 1): Met PT Short Term Goal 3 (Week 1): Pt will perform bed mobility supine<>sit with min assist PT Short Term Goal 3 - Progress (Week 1): Met Week 2:  PT Short Term Goal 1 (Week 2): STG = LTGs  Skilled Therapeutic Interventions/Progress Updates:    Pt presents in w/c and performs w/c mobility training using hemi technique at supervision level down to therapy gym. Session focused on neuro re-ed to address tone management, postural control re-training, balance re-training, and gait. Performed stretching to RLE for tone management prior to initiating standing mobility. Performed mod assist for initial stand pivot transfer due to impaired postural control and tendency for posterior bias and lean. Facilitation for anterior weightshift during sit <> stands and pre-gait activities including weightshifting and postural control retraining in preparation for gait as pt with strong posterior bias and reports he feels like he is leaning to the R. Tactile and verbal cues for feedback to RLE to decrease flexor and adduction tone as well as perform equal weightbearing. Introduced Geologist, engineering for better [employer] with improvement noted and pt able to better self correct. Pt able to achieve static standing balance with close supervision/cGA as progressed. Performed functional LUE reaching task to incorporate dynamic task and promote anterior weightshift. Progressed NMR from pre-gait to gait with QC and continued use of mirror for visual feedback with  initial light mod assist needed due to better weightshifting and some advancement of RLE but progressed to min assist/CGA with improved overall weightshifting and performed better control of balance with placement of QC more anterior and L lateral to promote anterior weightshift x 50' til fatigue. End of session returned back to bed with min to mod assist for stand pivot transfer, due to fatigue and increased RLE tone, had difficulty with placement of RLE. Performed blocked practice sit <> Stands x 5 reps with focus on carryover of transitional movement pattern retraining with overall CGA. Returned to supine with CGA for RLE management and all needs in reach.   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   Therapy/Group: Individual Therapy  Canary Brim Ivory Broad, PT, DPT, CBIS  02/03/2020, 12:23 PM

## 2020-02-03 NOTE — Progress Notes (Signed)
Occupational Therapy Session Note  Patient Details  Name: Joshua Shannon MRN: [id number] Date of Birth: [birth year]  Today's Date: 02/03/2020 OT Individual Time: 0800-0908 OT Individual Time Calculation (min): 68 min    Short Term Goals: Week 2:  OT Short Term Goal 1 (Week 2): Pt will complete toilet transfer stand pivot with quadcane and mod assist. OT Short Term Goal 2 (Week 2): Pt will complete walk-in shower transfers with mod assist using the quadcane for support. OT Short Term Goal 3 (Week 2): Pt will complete LB bathing sit to stand with min assist. OT Short Term Goal 4 (Week 2): Pt will complete LB dressing sit to stand with min assist including AFO and shoes.  Skilled Therapeutic Interventions/Progress Updates:    Pt completed transfer from supine to sit EOB with min assist to start the session.  He then completed squat pivot transfer to the right to the wheelchair with mod assist for bathing and dressing at the sink.  He was able to complete UB bathing with min assist and then donn a pullover shirt with supervision.  Mod assist for LB bathing and for donning new brief and pants sit to stand.  He still demonstrates increased flexor tone in the digits of the right hand as well as the elbow and hamstrings.  He was able to donn his left shoe but then the therapist had to assist with donning right AFO and TEDs.  Finished session with application of resting hand splint for the RUE as well as placement of the Saebo Go Stim on the right forearm to help with activation of the digit extensors.  Call button and phone left in reach with RUE supported on half lap tray. Intensity of stimulation at level 20 with the below settings.   Saebo Stim  330 pulse width 35 Hz pulse rate On 8 sec/ off 8 sec Ramp up/ down 2 sec Symmetrical Biphasic wave form  Max intensity at 500 Ohm load  Therapy Documentation Precautions:  Precautions Precautions: Fall Precaution Comments: residual R sided  weakness from previous CVA in May of 2020 Restrictions Weight Bearing Restrictions: No RLE Weight Bearing: Weight bearing as tolerated Other Position/Activity Restrictions: WBAT RLE  Pain: Pain Assessment Pain Scale: Faces Faces Pain Scale: Hurts a little bit Pain Type: Surgical pain Pain Location: Hip Pain Orientation: Right Pain Descriptors / Indicators: Discomfort ADL: See Care Tool Section for some details of mobility and selfcare  Therapy/Group: Individual Therapy  Perris Tripathi OTR/L 02/03/2020, 12:22 PM

## 2020-02-04 ENCOUNTER — Inpatient Hospital Stay (HOSPITAL_COMMUNITY): Payer: Medicare Other | Admitting: Occupational Therapy

## 2020-02-04 ENCOUNTER — Inpatient Hospital Stay (HOSPITAL_COMMUNITY): Payer: Medicare Other | Admitting: Physical Therapy

## 2020-02-04 NOTE — Progress Notes (Signed)
[facility] PHYSICAL MEDICINE & REHABILITATION PROGRESS NOTE   Subjective/Complaints:   Per OT still going into flexor spasms  RIght knee  Sleeps well at night , no sig  R hip pain   ROS-  no abd pain  Or N/V.  No CP or SOB  Objective:   No results found. No results for input(s): WBC, HGB, HCT, PLT in the last 72 hours. No results for input(s): NA, K, CL, CO2, GLUCOSE, BUN, CREATININE, CALCIUM in the last 72 hours.  Intake/Output Summary (Last 24 hours) at 02/04/2020 0754 Last data filed at 02/04/2020 7616 Gross per 24 hour  Intake 390 ml  Output 400 ml  Net -10 ml     Physical Exam: Vital Signs Blood pressure 131/70, pulse 80, temperature 98.6 F (37 C), temperature source Oral, resp. rate 18, height 5\' 7"  (1.702 m), weight 59 kg, SpO2 96 %. General: No acute distress. Sitting up in bedside chair; comfortable except for R hand- rubbing R hand and R leg constantly, NAD  Mood and affect are appropriate Heart: Regular rate and rhythm no rubs murmurs or extra sounds Lungs: Clear to auscultation, breathing unlabored, no rales or wheezes Abdomen: Positive bowel sounds, soft nontender to palpation, nondistended Extremities: No clubbing, cyanosis, or edema Skin: No evidence of breakdown, no evidence of rash Neurologic: Cranial nerves II through XII intact, motor strength is 5/5 in LEFT deltoid, bicep, tricep, grip, hip flexor, knee extensors, ankle dorsiflexor and plantar flexor 0/5 RUE, 0/5 LLE in all muscle groups  Tone- MAS 3 in R elbow flexors, mAS 4 in RIght finger flexors; clawing; difficulty to get into R hand splint MAS 3 in R Hamstrings and quads. Crepitus in right knee with spasticity testing.  Sensory exam normal sensation to light touch and proprioception in bilateral upper and lower extremities Cerebellar exam normal finger to nose to finger as well as heel to shin in bilateral upper and lower extremities Musculoskeletal: Full range of motion in all 4 extremities. No  joint swelling    Assessment/Plan: 1. Functional deficits secondary to RIght hip fracture due to fall in a pt with chronic spastic hemi which require 3+ hours per day of interdisciplinary therapy in a comprehensive inpatient rehab setting.  Physiatrist is providing close team supervision and 24 hour management of active medical problems listed below.  Physiatrist and rehab team continue to assess barriers to discharge/monitor patient progress toward functional and medical goals  Care Tool:  Bathing    Body parts bathed by patient: Right arm, Chest, Abdomen, Front perineal area, Right upper leg, Left upper leg, Right lower leg, Left lower leg, Face   Body parts bathed by helper: Left arm, Buttocks Body parts n/a: Left arm, Buttocks   Bathing assist Assist Level: Moderate Assistance - Patient 50 - 74%     Upper Body Dressing/Undressing Upper body dressing   What is the patient wearing?: Pull over shirt    Upper body assist Assist Level: Supervision/Verbal cueing    Lower Body Dressing/Undressing Lower body dressing      What is the patient wearing?: Pants, Incontinence brief     Lower body assist Assist for lower body dressing: Moderate Assistance - Patient 50 - 74%     Toileting Toileting    Toileting assist Assist for toileting: Minimal Assistance - Patient > 75%     Transfers Chair/bed transfer  Transfers assist     Chair/bed transfer assist level: Minimal Assistance - Patient > 75%     Locomotion Ambulation  Ambulation assist   Ambulation activity did not occur: Safety/medical concerns  Assist level: Minimal Assistance - Patient > 75% Assistive device: Cane-quad Max distance: 174ft   Walk 10 feet activity   Assist  Walk 10 feet activity did not occur: Safety/medical concerns  Assist level: Minimal Assistance - Patient > 75% Assistive device: Cane-quad   Walk 50 feet activity   Assist Walk 50 feet with 2 turns activity did not occur:  Safety/medical concerns  Assist level: Minimal Assistance - Patient > 75% Assistive device: Cane-quad    Walk 150 feet activity   Assist Walk 150 feet activity did not occur: Safety/medical concerns         Walk 10 feet on uneven surface  activity   Assist Walk 10 feet on uneven surfaces activity did not occur: Safety/medical concerns         Wheelchair     Assist Will patient use wheelchair at discharge?: Yes Type of Wheelchair: Manual    Wheelchair assist level: Supervision/Verbal cueing Max wheelchair distance: 125ft    Wheelchair 50 feet with 2 turns activity    Assist        Assist Level: Supervision/Verbal cueing   Wheelchair 150 feet activity     Assist      Assist Level: Supervision/Verbal cueing   Blood pressure 131/70, pulse 80, temperature 98.6 F (37 C), temperature source Oral, resp. rate 18, height 5\' 7"  (1.702 m), weight 59 kg, SpO2 96 %.    Medical Problem List and Plan: 1.Decreased functional mobilitysecondary to right FNF.S/P righthemiarthroplasty anterior approach 01/21/2020. Weightbearing as tolerated. 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 Was at w/c level PTA   -Continue CIR PT, OT. ELOS 2/19 ALF vs SNF portion of [facility]  2. Antithrombotics: -DVT/anticoagulation:Lovenox. Negative vascular study -antiplatelet therapy: resume ASA 325mg  daily 3. Pain Management:limit to tylenol, ice pack for now given AMS. 4. Mood:Prozac 40 mf daily -antipsychotic agents: N/A 5. Neuropsych: This patientiscapable of making decisions on hisown behalf. 6. Skin/Wound Care:Routine skin checks- suture removal today  7. Fluids/Electrolytes/Nutrition:Routine in and outs with follow-up chemistries 8. Acute blood loss anemia. Follow-up CBC 9. 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  .Pt  received botox injections by Dr. Dimas Millin [facility] on 11/30/2019.Still has severe RUE and RLE tone -Patientalsoon baclofen 10 mg in the morning 20 mg nightly. -family to bring in resting WHO and day time splint from ALF -was using w/c predominantly PTA  2mg  QID tizanidine,0.25mg  Klonopin qhs, no evidence  for sedation  botulinum toxin as OP  Consider further increase to tizanidine  10. BPH. Resume Flomax 0.4 mg daily. 11.Hyperlipidemia. Lipitor 12. Constipation.  13. Confusion: Likely medication induced. Hydrocodone dc'ed already -would go ahead and hold robaxin too. -low grade temp, WBC normal --- negative  UA, UCX neg thus far  14. Insomnia:  On klonopin for hamstring spasms, this has helped sleep as well  LOS: 11 days A FACE TO FACE EVALUATION WAS PERFORMED  Charlett Blake 02/04/2020, 7:54 AM

## 2020-02-04 NOTE — Plan of Care (Signed)
  Problem: Consults Goal: RH GENERAL PATIENT EDUCATION Description: See Patient Education module for education specifics. Outcome: Progressing Goal: Skin Care Protocol Initiated - if Braden Score 18 or less Description: If consults are not indicated, leave blank or document N/A Outcome: Progressing   Problem: RH BLADDER ELIMINATION Goal: RH STG MANAGE BLADDER WITH ASSISTANCE Description: STG Manage Bladder With min Assistance Outcome: Progressing   Problem: RH SKIN INTEGRITY Goal: RH STG SKIN FREE OF INFECTION/BREAKDOWN Description: Pt will cont to be free of skin breakdown or infection during CIR stay with supervision assist Outcome: Progressing Goal: RH STG MAINTAIN SKIN INTEGRITY WITH ASSISTANCE Description: STG Maintain Skin Integrity With supervision Assistance. Outcome: Progressing Goal: RH STG ABLE TO PERFORM INCISION/WOUND CARE W/ASSISTANCE Description: STG Able To Perform Incision/Wound Care With min Assistance. Outcome: Progressing   Problem: RH SAFETY Goal: RH STG ADHERE TO SAFETY PRECAUTIONS W/ASSISTANCE/DEVICE Description: STG Adhere to Safety Precautions With cues/reminders Assistance/Device. Outcome: Progressing

## 2020-02-04 NOTE — Progress Notes (Signed)
Occupational Therapy Session Note  Patient Details  Name: Joshua Shannon MRN: [id number] Date of Birth: 09/01/1941  Today's Date: 02/04/2020 OT Individual Time: 4098-1191 OT Individual Time Calculation (min): 80 min    Short Term Goals: Week 2:  OT Short Term Goal 1 (Week 2): Pt will complete toilet transfer stand pivot with quadcane and mod assist. OT Short Term Goal 2 (Week 2): Pt will complete walk-in shower transfers with mod assist using the [place] for support. OT Short Term Goal 3 (Week 2): Pt will complete LB bathing sit to stand with min assist. OT Short Term Goal 4 (Week 2): Pt will complete LB dressing sit to stand with min assist including AFO and shoes.  Skilled Therapeutic Interventions/Progress Updates:    Session 1 [phone]) Pt worked on shower and dressing during session.  Charlaine Dalton was used for transfer in and out of the shower secondary to time constraints.  He was able to complete sit to stand on the [place] with min assist but needs mod instructional cueing for greater trunk flexion.  Noted RLE tone in the hamstrings as well with sit to stand on the [place], in the shower, and when dressing at the sink.  Mod facilitation from therapist to keep the RLE on the floor.  He was able to complete UB bathing with min assist for washing the left upper arm and UB dressing with increased time and supervision.  He needed mod assist for LB bathing and for donning brief and pants sit to stand.  He needed total assist for TEDs with max assist for the right shoe and AFO.  Supervision was needed for donning the left shoe and fastening the shoe buttons.  Finished session with therapist assist to donn the resting hand splint on the right hand and apply half lap tray to the wheelchair.  Call button and phone in reach with safety belt in place.    Session 2 (1308-6578)  Pt in wheelchair to start session.  Therapist transported him down to the therapy gym via wheelchair and he completed stand pivot  transfer from the wheelchair to the therapy mat with mod assist using the quad cane for support.  He worked on Clinical research associate as well as trunk strengthening in sitting to start.  Right hand was placed on hand paddle for control of digit flexor tone.  He worked on placing and maintaining the RUE into weightbearing on a step placed on the mat while working on reciprical scooting of the right hip.  Pt demonstrates decreased ability to maintain right hand on the step and needs mod assist to maintain.  He also exhibits decreased ability to weightshift and reciprically scoot on the right, requiring mod facilitation, in order to not try and use the LLE to help with scooting. Also placed plinth in front of pt with step on it for increased height and had pt place UEs on top of it while working on forward trunk flexion and sit to squat.  Max facilitation at the right elbow to maintain weightbearing through the RUE, with min to mod facilitation in the right knee to avoid adduction.  While completing all weightbearing tasks, NMES was provided to the digit extensors of the right hand via Saebo Go Stim.  He tolerated 40 mins of stimulation on level 20 without any adverse reactions.  See below for details of settings.  Finished session with transfer from the mat to the wheelchair with mod assist stand pivot.  Returned to the room where  pt was transferred onto the toilet with use of the [place] secondary to urgency.  Max assist needed for management of clothing.  NT in room to assist with transfer off of the toilet.   Saebo Stim One 330 pulse width 35 Hz pulse rate, On 8 sec/ off 8 sec Ramp up/ down 2 sec Symmetrical Biphasic wave form  Max intensity at 500 Ohm load  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 Restrictions Weight Bearing Restrictions: No RLE Weight Bearing: Weight bearing as  tolerated Other Position/Activity Restrictions: WBAT RLE   Pain: Pain Assessment Pain Scale: Faces Pain Score: 5  Faces Pain Scale: Hurts a little bit Pain Type: Surgical pain Pain Location: Hip Pain Orientation: Right Pain Descriptors / Indicators: Discomfort Pain Onset: With Activity Patients Stated Pain Goal: 2 Pain Intervention(s): Emotional support;Repositioned ADL: See Care Tool Section for some details of mobility and selfcare  Therapy/Group: Individual Therapy  Traxton Kolenda OTR/L 02/04/2020, 11:45 AM

## 2020-02-04 NOTE — Progress Notes (Signed)
Physical Therapy Session Note  Patient Details  Name: Joshua Shannon MRN: [id number] Date of Birth: 02/05/1941  Today's Date: 02/04/2020 PT Individual Time: 1105-1205 PT Individual Time Calculation (min): 60 min   Short Term Goals: Week 2:  PT Short Term Goal 1 (Week 2): STG = LTGs  Skilled Therapeutic Interventions/Progress Updates:    Pt received sitting in w/c and agreeable to therapy session. Meghan, orthotist, present for AFO consultation as pt's current GRAFO is not the appropriate size and is not facilitating enough knee extension during stance nor gait.  Transported to/from gym in w/c. Performed seated R LE long arc quads x10 reps with therapist providing slide over pressure into knee extension for tone management prior to standing. Sit<>stands with primarily min assist for balance throughout session and with min cuing pt demonstrating carryover of yesterday's education regarding R LE foot positioning prior to stance and maintaining R weight shift for weightbearing upon coming to stance. Gait training ~42ft using QC while demonstrating current gait mechanics with pt's GRAFO - min assist for balance and increased R LE step length - continues to demonstrate R knee flexion during stance and intermittent R toe drag during swing as well as poor R weight shift during stance via compensation with L UE support on AD. Donned a different GRAFO with a larger anterior plate for more knee extension support. Gait training ~55ft using QC with CGA/min assist for balance and pt demonstrating increased R knee extension during stance and improved R LE foot clearance and step length during swing resulting in decreased assistance needed for ambulation. Continued wearing this GRAFO for remainder of session. Gait training in // bars with pt using L UE support on therapist's shoulder with focus on ambulation with increased R lateral weight shift during stance phase as pt continues to demonstrate significant compensation  via pushing down through L UE - min/mod assist for balance due to posterior lean and max multimodal cuing for sequencing. Standing R LE NMR targeting stance phase hip/knee extension and strengthening without UE support via stepping L LE on/off 4" step - mirror feedback and max multimodal cuing for sequencing of R LE extension and R weight shift prior to stepping L LE onto step - mod assist for balance due to posterior lean after every step. Squat/stand pivot transfers w/c<>EOM with min assist for balance and therapist reinforcing proper set-up and sequencing to maintain R foot on floor, despite tone, and for improved balance. Transported back to room in w/c and left seated with needs in reach and seat belt alarm on.  Of note: Pt told therapist that he woke up feeling "depressed" this morning but that he is feeling better now - therapist provided emotional support and offered consultation to neuropsych but pt declined.  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   Therapy/Group: Individual Therapy  Ginny Forth, PT, DPT 02/04/2020, 8:00 AM

## 2020-02-05 ENCOUNTER — Inpatient Hospital Stay (HOSPITAL_COMMUNITY): Payer: Medicare Other | Admitting: Physical Therapy

## 2020-02-05 ENCOUNTER — Inpatient Hospital Stay (HOSPITAL_COMMUNITY): Payer: Medicare Other | Admitting: Occupational Therapy

## 2020-02-05 DIAGNOSIS — S72001A Fracture of unspecified part of neck of right femur, initial encounter for closed fracture: Secondary | ICD-10-CM

## 2020-02-05 NOTE — Progress Notes (Signed)
[place] PHYSICAL MEDICINE & REHABILITATION PROGRESS NOTE   Subjective/Complaints:   No issues overnight.  Spasms are doing fairly well today.  ROS-  no abd pain  Or N/V.  No CP or SOB  Objective:   No results found. No results for input(s): WBC, HGB, HCT, PLT in the last 72 hours. No results for input(s): NA, K, CL, CO2, GLUCOSE, BUN, CREATININE, CALCIUM in the last 72 hours.  Intake/Output Summary (Last 24 hours) at 02/05/2020 1054 Last data filed at 02/05/2020 0900 Gross per 24 hour  Intake 120 ml  Output 500 ml  Net -380 ml     Physical Exam: Vital Signs Blood pressure 134/64, pulse 73, temperature 98.4 F (36.9 C), temperature source Oral, resp. rate 18, height 5\' 7"  (1.702 m), weight 59 kg, SpO2 98 %. General: No acute distress. Sitting up in bedside chair; comfortable except for R hand- rubbing R hand and R leg constantly, NAD  Mood and affect are appropriate Heart: Regular rate and rhythm no rubs murmurs or extra sounds Lungs: Clear to auscultation, breathing unlabored, no rales or wheezes Abdomen: Positive bowel sounds, soft nontender to palpation, nondistended Extremities: No clubbing, cyanosis, or edema Skin: No evidence of breakdown, no evidence of rash Neurologic: Cranial nerves II through XII intact, motor strength is 5/5 in LEFT deltoid, bicep, tricep, grip, hip flexor, knee extensors, ankle dorsiflexor and plantar flexor 0/5 RUE, 0/5 LLE in all muscle groups  Tone- MAS 3 in R elbow flexors, mAS 4 in RIght finger flexors; clawing; difficulty to get into R hand splint MAS 3 in R Hamstrings and quads. Crepitus in right knee with spasticity testing.  Sensory exam normal sensation to light touch and proprioception in bilateral upper and lower extremities Cerebellar exam normal finger to nose to finger as well as heel to shin in bilateral upper and lower extremities Musculoskeletal: Full range of motion in all 4 extremities. No joint swelling     Assessment/Plan: 1. Functional deficits secondary to RIght hip fracture due to fall in a pt with chronic spastic hemi which require 3+ hours per day of interdisciplinary therapy in a comprehensive inpatient rehab setting.  Physiatrist is providing close team supervision and 24 hour management of active medical problems listed below.  Physiatrist and rehab team continue to assess barriers to discharge/monitor patient progress toward functional and medical goals  Care Tool:  Bathing    Body parts bathed by patient: Right arm, Chest, Abdomen, Front perineal area, Right upper leg, Left upper leg, Right lower leg, Left lower leg, Face   Body parts bathed by helper: Buttocks, Left arm Body parts n/a: Left arm, Buttocks   Bathing assist Assist Level: Moderate Assistance - Patient 50 - 74%     Upper Body Dressing/Undressing Upper body dressing   What is the patient wearing?: Pull over shirt    Upper body assist Assist Level: Supervision/Verbal cueing    Lower Body Dressing/Undressing Lower body dressing      What is the patient wearing?: Pants, Incontinence brief     Lower body assist Assist for lower body dressing: Moderate Assistance - Patient 50 - 74%     Toileting Toileting    Toileting assist Assist for toileting: Total Assistance - Patient < 25%     Transfers Chair/bed transfer  Transfers assist     Chair/bed transfer assist level: Moderate Assistance - Patient 50 - 74%     Locomotion Ambulation   Ambulation assist   Ambulation activity did not occur: Safety/medical  concerns  Assist level: Minimal Assistance - Patient > 75% Assistive device: Cane-quad Max distance: 52ft   Walk 10 feet activity   Assist  Walk 10 feet activity did not occur: Safety/medical concerns  Assist level: Minimal Assistance - Patient > 75% Assistive device: Cane-quad   Walk 50 feet activity   Assist Walk 50 feet with 2 turns activity did not occur: Safety/medical  concerns  Assist level: Minimal Assistance - Patient > 75% Assistive device: Cane-quad    Walk 150 feet activity   Assist Walk 150 feet activity did not occur: Safety/medical concerns         Walk 10 feet on uneven surface  activity   Assist Walk 10 feet on uneven surfaces activity did not occur: Safety/medical concerns         Wheelchair     Assist Will patient use wheelchair at discharge?: Yes Type of Wheelchair: Manual    Wheelchair assist level: Supervision/Verbal cueing Max wheelchair distance: 110ft    Wheelchair 50 feet with 2 turns activity    Assist        Assist Level: Supervision/Verbal cueing   Wheelchair 150 feet activity     Assist      Assist Level: Supervision/Verbal cueing   Blood pressure 134/64, pulse 73, temperature 98.4 F (36.9 C), temperature source Oral, resp. rate 18, height 5\' 7"  (1.702 m), weight 59 kg, SpO2 98 %.    Medical Problem List and Plan: 1.Decreased functional mobilitysecondary to right FNF.S/P righthemiarthroplasty anterior approach 01/21/2020. Weightbearing as tolerated. 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 Was at w/c level PTA   -Continue CIR PT, OT. ELOS 2/19 ALF vs SNF portion of [facility]  2. Antithrombotics: -DVT/anticoagulation:Lovenox. Negative vascular study -antiplatelet therapy: resume ASA 325mg  daily 3. Pain Management:limit to tylenol, ice pack for now given AMS. 4. Mood:Prozac 40 mf daily -antipsychotic agents: N/A 5. Neuropsych: This patientiscapable of making decisions on hisown behalf. 6. Skin/Wound Care:Routine skin checks- suture removal today  7. Fluids/Electrolytes/Nutrition:Routine in and outs with follow-up chemistries 8. Acute blood loss anemia. Follow-up CBC 9. 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  .Pt received botox  injections by Dr. [facility] on 11/30/2019.Still has severe RUE and RLE tone -Patientalsoon baclofen 10 mg in the morning 20 mg nightly. -family to bring in resting WHO and day time splint from ALF -was using w/c predominantly PTA  2mg  QID tizanidine,0.25mg  Klonopin qhs, no evidence  for sedation  botulinum toxin as OP  Consider further increase to tizanidine  10. BPH. Resume Flomax 0.4 mg daily. 11.Hyperlipidemia. Lipitor 12. Constipation.  13. Confusion: Likely medication induced. Hydrocodone dc'ed already -would go ahead and hold robaxin too. -low grade temp, WBC normal --- negative  UA, UCX neg thus far  14. Insomnia:  On klonopin for hamstring spasms, this has helped sleep as well  LOS: 12 days A FACE TO FACE EVALUATION WAS PERFORMED  Charlett Blake 02/05/2020, 10:54 AM

## 2020-02-05 NOTE — Progress Notes (Signed)
Occupational Therapy Session Note  Patient Details  Name: Joshua Shannon MRN: [id number] Date of Birth: Feb 03, 1941  Today's Date: 02/05/2020 OT Individual Time: 2505-3976 OT Individual Time Calculation (min): 72 min   Skilled Therapeutic Interventions/Progress Updates:    Pt greeted in bed, stated he didn't sleep well last night due to Rt thigh pain. Pt reported pain absolved in the AM once his pain medicine kicked in. Noticed that is Rt arm splint was doffed. Asked pt about wearing it at night and he said that he took it off around 8pm before he went to bed. He requested assistance to the toilet as he needed to have a BM. Supine<sit completed with supervision assist and OT donned gripper socks for time mgt. Min A for squat pivot<w/c<elevated toilet going towards Lt side with vcs for hand placement. Pt stood with quad cane while OT completed clothing mgt, mod facilitation for keeping Rt foot on the floor due to tone. He attempted to complete perihygiene himself while seated when he was done, however he ultimately needed assist from OT. Facilitation needed for keeping Rt foot on the floor once again while hygiene was completed and new brief was donned. Pt able to thread both LEs into new pants with steady assist for sitting balance. Max A for pulling them up while pt stood with quad cane. Mod A for squat pivot<w/c going towards the Rt side. While sitting at the sink, pt completed oral care and grooming tasks using one handed techniques as needed. OT performed passive stretching in elbow extension and shoulder external rotation for tone reduction. Discussed importance of washing his affected limb every day to monitor for skin breakdown and ensure hygiene in crevices. Together, we washed and thoroughly dried R UE before applying his antispasticity splint. OT donned Teds and pts Rt shoe with AFO. Pt able to don Lt shoe and fasten laces with shoe buttons given vcs for the Rt. At end of session pt remained in his  w/c with half lap tray, safety belt, and all needs within reach. Tx focus placed on functional transfers, NMR, ADL retraining, and sitting/standing balance.     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 Restrictions Weight Bearing Restrictions: No RLE Weight Bearing: Weight bearing as tolerated Other Position/Activity Restrictions: WBAT RLE ADL: ADL Eating: Minimal assistance Where Assessed-Eating: Wheelchair Grooming: Minimal assistance Where Assessed-Grooming: Wheelchair Upper Body Bathing: Minimal assistance Where Assessed-Upper Body Bathing: Wheelchair Lower Body Bathing: Maximal assistance Where Assessed-Lower Body Bathing: Wheelchair Upper Body Dressing: Supervision/safety Where Assessed-Upper Body Dressing: Wheelchair Lower Body Dressing: Maximal assistance Where Assessed-Lower Body Dressing: Wheelchair Toileting: Maximal assistance Toilet Transfer: Dependent(Stedy) Acupuncturist: Bedside commode     Therapy/Group: Individual Therapy  Aurelio Mccamy A Marven Veley 02/05/2020, 12:27 PM

## 2020-02-05 NOTE — Progress Notes (Signed)
Physical Therapy Session Note  Patient Details  Name: Joshua Shannon MRN: [id number] Date of Birth: 09/04/1941  Today's Date: 02/05/2020 PT Individual Time: 1401-1500 PT Individual Time Calculation (min): 59 min   Short Term Goals: Week 1:  PT Short Term Goal 1 (Week 1): Pt will initiate gait training PT Short Term Goal 1 - Progress (Week 1): Met PT Short Term Goal 2 (Week 1): Pt will perform bed<>chair transfers with mod assist PT Short Term Goal 2 - Progress (Week 1): Met PT Short Term Goal 3 (Week 1): Pt will perform bed mobility supine<>sit with min assist PT Short Term Goal 3 - Progress (Week 1): Met Week 2:  PT Short Term Goal 1 (Week 2): STG = LTGs  Skilled Therapeutic Interventions/Progress Updates:   Pt received supine in bed and agreeable to PT. Pt reports to increased tone in the RLE today compared to previous sessions.  Steady stretch into HS and gastroc to reduce tone. Supine>sit with supervision assist. Assisted pt to don pants with mod to maintain balance due to RLE tone. Standing balance x 2 minutes to bear weight through RLE and limit tone. Stand pivot transfer to [facility] with mod assist to prevent flexor tone in the RLE.   PT performed prolong passive stretch to RLE  3 x 2 minutes to lengthen HS and gastroc. As well as reduce tone. Sit<>stand x 2 with LLE on step to force improved WB through RLE, but unable to flexor response with exertion. Sit<>stand x 3 with min assist from level floor. Gait training x 41f with moderate assist throughout due to adduction and limited terminal knee extension on the RLE.   Blocked practice sit<>stand x 5 with min assist and moderate cues for sequencing including anterior weight shift, and wide BOS. Additional sit<>stand x 5 with tband at knees for visual cue to improve glute med activation and reduce adduction tone, mild improvement in hip abduction through transfers. Stand pivot [facility] assist overall and moderate cues for  sequencing and gait pattern.   WC mobility x 1545fwith supervision assist with min cues for safety in turns and awareness of obstacles on the R side.    [facility] to [facility] and [facility] reach and all needs met.         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 Restrictions Weight Bearing Restrictions: No RLE Weight Bearing: Weight bearing as tolerated Other Position/Activity Restrictions: WBAT RLE Vital Signs: Therapy Vitals Temp: 97.7 F (36.5 C) Pulse Rate: 87 Resp: 14 BP: (!) 102/57 Patient Position (if appropriate): Sitting Oxygen Therapy SpO2: 98 % O2 Device: Room Air Pain: Denies at rest    Therapy/Group: Individual Therapy  AuLorie Phenix/13/2021, 4:27 PM

## 2020-02-06 ENCOUNTER — Inpatient Hospital Stay (HOSPITAL_COMMUNITY): Payer: Medicare Other

## 2020-02-06 MED ORDER — TIZANIDINE HCL 4 MG PO TABS
4.0000 mg | ORAL_TABLET | Freq: Three times a day (TID) | ORAL | Status: DC
  Administered 2020-02-06 – 2020-02-07 (×3): 4 mg via ORAL
  Filled 2020-02-06 (×3): qty 1

## 2020-02-06 NOTE — Progress Notes (Signed)
Patient had dose of 4 mg zaniflex per new order, which helped pain and cramping a lot but became drowsy and struggled with therapy. Did not sleep much the prior night so had some sleep deficit on top of increased dose. Will report to oncoming RN.

## 2020-02-06 NOTE — Progress Notes (Signed)
Physical Therapy Session Note  Patient Details  Name: Joshua Shannon MRN: [id number] Date of Birth: 09-27-1941  Today's Date: 02/06/2020 PT Individual Time: 1131-1200 and 2094-7096 PT Individual Time Calculation (min): 29 min and 61 minutes    Short Term Goals: Week 2:  PT Short Term Goal 1 (Week 2): STG = LTGs  Skilled Therapeutic Interventions/Progress Updates:    Session 1: Patient supine in bed upon PT arrival, agreeable to therapy tx, reports minimal pain in R hip prior to therapy but denied pain throughout session. Pt said he didn't sleep well last night and had a lot of leg cramps. Pt stated thinking he was minimally wet and needing a brief change when asked, minA to assist brief change and cues for technique and positioning, pt able to hold bridge in order to allow for therapist to donn brief. Therapist donned TEDs and shoes totalA for time management. Supine>sitting EOB minA for LE management. While pt sitting EOB, therapist performed multiple bouts of long duration R hamstring stretch to dec flexor tone, then pt performed active assisted R LAQ to facilitate muscle activation 2 x 10 reps demonstrated improved range requiring less assistance in second set. Therapist donned resting R hand split total A. Squat pivot transfer to w/c with modA for RLE management and input to facilitate extension, cues for sequencing and positioning. Pt left in w/c with needs in reach and chair alarm set.   Session 2: Patient seated in recliner upon PT arrival, agreeable to therapy tx, denies pain this afternoon. Pt wife present this session for family education/observation of the session, pt's wife also facetimed patient's daughter (who is an OT) to observe session. Pt transferred to w/c with mod assist, stand pivot cues for techniques. Pt transported to the therapy gym. Pt transferred to mat stand pivot mod assist, cues for techniques. Pt seated edge of mat while therapist performed passive R hamstring stretching 2  x 1 min and then pt performed active R knee extension ROM 2 x 5. Daughter reports that his tone appears worse than prior. Daughter reports that patient was able to perform stand pivots with QC prior to admission with CGA. Pt performed sit<>stands x 8 this session, blocked practice with emphasis on anterior weightshift and therapist facilitating R LE placement/weightbearing. Able to stabilize in standing with just CGA from therapist and without QC after mutliple sit<>stand trials. Upon standing worked on finding static standing balance with only intermittent UE support, midline orientation/symmetric weightbearing, and R quad activation/knee extension. Pt then ambulated x 25 ft this session with QC and min assist, pt able to advance R LE without assist but able to increase R step length with assist from therapist. Daughter reports that this bout of gait looked similar to how he ambulated prior, he would adduct R LE, was able to extend knee further during stance prior. Pt reports feeling very fatigued this afternoon, wife reports that he was alseep in recliner when she arrived and confused when he woke up from napping. Suspect due to increase in medication (Zanaflex to 4 mg) and patient also reports he did not sleep last night. Therapist and family discussed that fatigue can also increase tone. Pt performed sit<>stand from w/c x 2 however difficulty finding static balance and increased R LE tone noted. Therapist performed stretching to hamstrings, worked on seated knee extension AROM. Attempted to ambulate x 1 more trial however increased R LE tone and unable to extend/plant R LE on floor when stepping. Pt transported back to room  and performed stand pivot from w/c<>toilet with mod assist, use of grab bar and total assist for clothing management, continent of bladder. Stand pivot to bed with QC and mod assist, cues for techniques. Sit>supine min assist, left supine with needs in reach and bed alarm set, RN notified of  increased fatigue.   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 Restrictions Weight Bearing Restrictions: No RLE Weight Bearing: Weight bearing as tolerated Other Position/Activity Restrictions: WBAT RLE    Therapy/Group: Individual Therapy   Netta Corrigan, PT, DPT, CSRS 02/06/20  4:56 PM    Juliann Pulse SPT 02/06/2020, 7:53 AM

## 2020-02-06 NOTE — Progress Notes (Signed)
[place] PHYSICAL MEDICINE & REHABILITATION PROGRESS NOTE   Subjective/Complaints:   No issues except for his right lower extremity spasm.  He slept poorly last night.  ROS-  no abd pain  Or N/V.  No CP or SOB  Objective:   No results found. No results for input(s): WBC, HGB, HCT, PLT in the last 72 hours. No results for input(s): NA, K, CL, CO2, GLUCOSE, BUN, CREATININE, CALCIUM in the last 72 hours.  Intake/Output Summary (Last 24 hours) at 02/06/2020 0948 Last data filed at 02/06/2020 0550 Gross per 24 hour  Intake 180 ml  Output 500 ml  Net -320 ml     Physical Exam: Vital Signs Blood pressure (!) 146/67, pulse 78, temperature 97.7 F (36.5 C), temperature source Oral, resp. rate 18, height 5\' 7"  (1.702 m), weight 59 kg, SpO2 98 %. General: No acute distress. Sitting up in bedside chair; comfortable except for R hand- rubbing R hand and R leg constantly, NAD  Mood and affect are appropriate Heart: Regular rate and rhythm no rubs murmurs or extra sounds Lungs: Clear to auscultation, breathing unlabored, no rales or wheezes Abdomen: Positive bowel sounds, soft nontender to palpation, nondistended Extremities: No clubbing, cyanosis, or edema Skin: No evidence of breakdown, no evidence of rash Neurologic: Cranial nerves II through XII intact, motor strength is 5/5 in LEFT deltoid, bicep, tricep, grip, hip flexor, knee extensors, ankle dorsiflexor and plantar flexor 0/5 RUE, 0/5 LLE in all muscle groups  Tone- MAS 3 in R elbow flexors, mAS 4 in RIght finger flexors; clawing; difficulty to get into R hand splint MAS 3 in R Hamstrings and quads.  Unchanged, patient's  hamstring feels better with stretching Sensory exam normal sensation to light touch and proprioception in bilateral upper and lower extremities Cerebellar exam normal finger to nose to finger as well as heel to shin in bilateral upper and lower extremities Musculoskeletal: Full range of motion in all 4 extremities.  No joint swelling    Assessment/Plan: 1. Functional deficits secondary to RIght hip fracture due to fall in a pt with chronic spastic hemi which require 3+ hours per day of interdisciplinary therapy in a comprehensive inpatient rehab setting.  Physiatrist is providing close team supervision and 24 hour management of active medical problems listed below.  Physiatrist and rehab team continue to assess barriers to discharge/monitor patient progress toward functional and medical goals  Care Tool:  Bathing    Body parts bathed by patient: Right arm, Chest, Abdomen, Front perineal area, Right upper leg, Left upper leg, Right lower leg, Left lower leg, Face   Body parts bathed by helper: Buttocks, Left arm Body parts n/a: Left arm, Buttocks   Bathing assist Assist Level: Moderate Assistance - Patient 50 - 74%     Upper Body Dressing/Undressing Upper body dressing   What is the patient wearing?: Pull over shirt    Upper body assist Assist Level: Supervision/Verbal cueing    Lower Body Dressing/Undressing Lower body dressing      What is the patient wearing?: Pants, Incontinence brief     Lower body assist Assist for lower body dressing: Moderate Assistance - Patient 50 - 74%     Toileting Toileting    Toileting assist Assist for toileting: Total Assistance - Patient < 25%     Transfers Chair/bed transfer  Transfers assist     Chair/bed transfer assist level: Moderate Assistance - Patient 50 - 74%     Locomotion Ambulation   Ambulation assist  Ambulation activity did not occur: Safety/medical concerns  Assist level: Minimal Assistance - Patient > 75% Assistive device: Cane-quad Max distance: 79ft   Walk 10 feet activity   Assist  Walk 10 feet activity did not occur: Safety/medical concerns  Assist level: Minimal Assistance - Patient > 75% Assistive device: Cane-quad   Walk 50 feet activity   Assist Walk 50 feet with 2 turns activity did not occur:  Safety/medical concerns  Assist level: Minimal Assistance - Patient > 75% Assistive device: Cane-quad    Walk 150 feet activity   Assist Walk 150 feet activity did not occur: Safety/medical concerns         Walk 10 feet on uneven surface  activity   Assist Walk 10 feet on uneven surfaces activity did not occur: Safety/medical concerns         Wheelchair     Assist Will patient use wheelchair at discharge?: Yes Type of Wheelchair: Manual    Wheelchair assist level: Supervision/Verbal cueing Max wheelchair distance: 157ft    Wheelchair 50 feet with 2 turns activity    Assist        Assist Level: Supervision/Verbal cueing   Wheelchair 150 feet activity     Assist      Assist Level: Supervision/Verbal cueing   Blood pressure (!) 146/67, pulse 78, temperature 97.7 F (36.5 C), temperature source Oral, resp. rate 18, height 5\' 7"  (1.702 m), weight 59 kg, SpO2 98 %.    Medical Problem List and Plan: 1.Decreased functional mobilitysecondary to right FNF.S/P righthemiarthroplasty anterior approach 01/21/2020. Weightbearing as tolerated. 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 Was at w/c level PTA   -Continue CIR PT, OT. ELOS 2/19 ALF vs SNF portion of [facility]  2. Antithrombotics: -DVT/anticoagulation:Lovenox. Negative vascular study -antiplatelet therapy: resume ASA 325mg  daily 3. Pain Management:limit to tylenol, ice pack for now given AMS. 4. Mood:Prozac 40 mf daily -antipsychotic agents: N/A 5. Neuropsych: This patientiscapable of making decisions on hisown behalf. 6. Skin/Wound Care:Routine skin checks- suture removal today  7. Fluids/Electrolytes/Nutrition:Routine in and outs with follow-up chemistries 8. Acute blood loss anemia. Follow-up CBC 9. 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  .Pt  received botox injections by Dr. 3/19 [facility] on 11/30/2019.Still has severe RUE and RLE tone -Patientalsoon baclofen 10 mg in the morning 20 mg nightly. -family to bring in resting WHO and day time splint from ALF -was using w/c predominantly PTA  2mg  QID tizanidine,0.25mg  Klonopin qhs, no evidence  for sedation  botulinum toxin as OP, will increase tizanidine to 4 mg 3 times daily  10. BPH. Resume Flomax 0.4 mg daily. 11.Hyperlipidemia. Lipitor 12. Constipation.  13. Confusion: Likely medication induced. Hydrocodone dc'ed already -would go ahead and hold robaxin too. -low grade temp, WBC normal --- negative  UA, UCX neg thus far  14. Insomnia:  On klonopin for hamstring spasms, this has helped sleep as well, monitor for sedation LOS: 13 days A FACE TO FACE EVALUATION WAS PERFORMED  [facility] 02/06/2020, 9:48 AM

## 2020-02-07 ENCOUNTER — Inpatient Hospital Stay (HOSPITAL_COMMUNITY): Payer: Medicare Other | Admitting: Physical Therapy

## 2020-02-07 ENCOUNTER — Inpatient Hospital Stay (HOSPITAL_COMMUNITY): Payer: Medicare Other

## 2020-02-07 ENCOUNTER — Inpatient Hospital Stay (HOSPITAL_COMMUNITY): Payer: Medicare Other | Admitting: Occupational Therapy

## 2020-02-07 LAB — CREATININE, SERUM
Creatinine, Ser: 0.83 mg/dL (ref 0.61–1.24)
GFR calc Af Amer: >60 mL/min (ref >60)
GFR calc non Af Amer: >60 mL/min (ref >60)

## 2020-02-07 MED ORDER — BISACODYL 10 MG RE SUPP
10.0000 mg | Freq: Once | RECTAL | Status: AC
  Administered 2020-02-07: 17:00:00 10 mg via RECTAL
  Filled 2020-02-07: qty 1

## 2020-02-07 MED ORDER — POLYETHYLENE GLYCOL 3350 17 G PO PACK
17.0000 g | PACK | Freq: Two times a day (BID) | ORAL | Status: DC
  Administered 2020-02-07 – 2020-02-11 (×8): 17 g via ORAL
  Filled 2020-02-07 (×8): qty 1

## 2020-02-07 MED ORDER — SENNOSIDES-DOCUSATE SODIUM 8.6-50 MG PO TABS: 2.0000 | ORAL_TABLET | Freq: Every day | ORAL | Status: DC

## 2020-02-07 MED ORDER — FLEET ENEMA 7-19 GM/118ML RE ENEM
1.0000 | ENEMA | Freq: Every day | RECTAL | Status: DC | PRN
  Filled 2020-02-07 (×2): qty 1

## 2020-02-07 MED ORDER — TIZANIDINE HCL 2 MG PO TABS
2.0000 mg | ORAL_TABLET | Freq: Every day | ORAL | Status: DC
  Administered 2020-02-08 – 2020-02-10 (×3): 2 mg via ORAL
  Filled 2020-02-07 (×5): qty 1

## 2020-02-07 MED ORDER — TIZANIDINE HCL 2 MG PO TABS
2.0000 mg | ORAL_TABLET | Freq: Three times a day (TID) | ORAL | Status: DC
  Administered 2020-02-07 – 2020-02-11 (×12): 2 mg via ORAL
  Filled 2020-02-07 (×12): qty 1

## 2020-02-07 NOTE — Progress Notes (Signed)
Occupational Therapy Session Note  Patient Details  Name: Joshua Shannon MRN: [id number] Date of Birth: [birth year]  Today's Date: 02/07/2020 OT Individual Time: 1003-1108 OT Individual Time Calculation (min): 65 min    Short Term Goals: Week 2:  OT Short Term Goal 1 (Week 2): Pt will complete toilet transfer stand pivot with quadcane and mod assist. OT Short Term Goal 2 (Week 2): Pt will complete walk-in shower transfers with mod assist using the quadcane for support. OT Short Term Goal 3 (Week 2): Pt will complete LB bathing sit to stand with min assist. OT Short Term Goal 4 (Week 2): Pt will complete LB dressing sit to stand with min assist including AFO and shoes.  Skilled Therapeutic Interventions/Progress Updates:    Pt worked on Chief of Staff for his trunk and RUE during session.  Hand paddle was applied to the right hand for stretching and positioning of the fingers into extension.  Pt transitioned to supine with min assist and therapist completed stretching with PROM of shoulder flexion, external rotation, shoulder abduction as well as elbow extension. He was able to elicit some movements of shoulder flexion and extension as well as elbow extension, however increased tone in the biceps would hinder it once he activated it more than 30-40 degrees.  He transitioned back to sitting with min assist.  He continued to work on weightbearing facilitation of the RUE while focusing on scooting the right hip forward and backwards.  He was able to complete hip hike and right trunk shortening with mod facilitation and complete a small scoot forward and backwards.  He continues to need max assist to keep the right hand in contact with the surface of the mat secondary to increased tone.  Right resting hand splint was donned at completion of session.  He also completed squat pivot transfers during session with mod assist to the right and min assist to the left.  He will continue to need work on  this as with attempted stand pivot, he continues to demonstrate increased flexor tone in the right hamstrings and the RLE will flex off of the surface.  Finished session with transfer back to the room with pt sitting up in the wheelchair with call button and phone in reach.       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 Restrictions Weight Bearing Restrictions: No RLE Weight Bearing: Weight bearing as tolerated Other Position/Activity Restrictions: WBAT RLE  Pain: Pain Assessment Pain Scale: Faces Faces Pain Scale: Hurts a little bit Pain Type: Acute pain Pain Location: Shoulder Pain Orientation: Right Pain Descriptors / Indicators: Discomfort Pain Onset: With Activity Pain Intervention(s): Repositioned ADL: See Care Tool Section for some details of mobility and selfcare  Therapy/Group: Individual Therapy  Andi Layfield OTR/L 02/07/2020, 12:57 PM

## 2020-02-07 NOTE — Progress Notes (Signed)
[place] PHYSICAL MEDICINE & REHABILITATION PROGRESS NOTE   Subjective/Complaints: Mr. Sadler denies pain or constipation this morning.  Says he slept better last night.  As per RN note yesterday, patient was drowsy during the day and struggled with therapy.   ROS-  no abd pain  Or N/V.  No CP or SOB  Objective:   No results found. No results for input(s): WBC, HGB, HCT, PLT in the last 72 hours. No results for input(s): NA, K, CL, CO2, GLUCOSE, BUN, CREATININE, CALCIUM in the last 72 hours.  Intake/Output Summary (Last 24 hours) at 02/07/2020 0850 Last data filed at 02/07/2020 0725 Gross per 24 hour  Intake 1080 ml  Output 800 ml  Net 280 ml     Physical Exam: Vital Signs Blood pressure (!) 148/64, pulse 70, temperature 97.7 F (36.5 C), resp. rate 17, height 5\' 7"  (1.702 m), weight 59 kg, SpO2 96 %. General: No acute distress. Sitting up in bedside chair; comfortable except for R hand- rubbing R hand and R leg constantly, NAD  Mood and affect are appropriate Heart: Regular rate and rhythm no rubs murmurs or extra sounds Lungs: Clear to auscultation, breathing unlabored, no rales or wheezes Abdomen: Positive bowel sounds, soft nontender to palpation, nondistended Extremities: No clubbing, cyanosis, or edema Skin: No evidence of breakdown, no evidence of rash Neurologic: Cranial nerves II through XII intact, motor strength is 5/5 in LEFT deltoid, bicep, tricep, grip, hip flexor, knee extensors, ankle dorsiflexor and plantar flexor 0/5 RUE, 0/5 LLE in all muscle groups  Tone- MAS 3 in R elbow flexors, mAS 4 in RIght finger flexors; clawing; difficulty to get into R hand splint MAS 3 in R Hamstrings and quads.  Continues to be tight, especially on right side, patient's  hamstring feels better with stretching Sensory exam normal sensation to light touch and proprioception in bilateral upper and lower extremities Cerebellar exam normal finger to nose to finger as well as heel to  shin in bilateral upper and lower extremities Musculoskeletal: Full range of motion in all 4 extremities. No joint swelling    Assessment/Plan: 1. Functional deficits secondary to RIght hip fracture due to fall in a pt with chronic spastic hemi which require 3+ hours per day of interdisciplinary therapy in a comprehensive inpatient rehab setting.  Physiatrist is providing close team supervision and 24 hour management of active medical problems listed below.  Physiatrist and rehab team continue to assess barriers to discharge/monitor patient progress toward functional and medical goals  Care Tool:  Bathing    Body parts bathed by patient: Right arm, Chest, Abdomen, Front perineal area, Right upper leg, Left upper leg, Right lower leg, Left lower leg, Face   Body parts bathed by helper: Buttocks, Left arm Body parts n/a: Left arm, Buttocks   Bathing assist Assist Level: Moderate Assistance - Patient 50 - 74%     Upper Body Dressing/Undressing Upper body dressing   What is the patient wearing?: Pull over shirt    Upper body assist Assist Level: Supervision/Verbal cueing    Lower Body Dressing/Undressing Lower body dressing      What is the patient wearing?: Pants, Incontinence brief     Lower body assist Assist for lower body dressing: Moderate Assistance - Patient 50 - 74%     Toileting Toileting    Toileting assist Assist for toileting: Total Assistance - Patient < 25%     Transfers Chair/bed transfer  Transfers assist     Chair/bed transfer assist  level: Moderate Assistance - Patient 50 - 74%     Locomotion Ambulation   Ambulation assist   Ambulation activity did not occur: Safety/medical concerns  Assist level: Minimal Assistance - Patient > 75% Assistive device: Cane-quad Max distance: 25 ft   Walk 10 feet activity   Assist  Walk 10 feet activity did not occur: Safety/medical concerns  Assist level: Minimal Assistance - Patient >  75% Assistive device: Cane-quad   Walk 50 feet activity   Assist Walk 50 feet with 2 turns activity did not occur: Safety/medical concerns  Assist level: Minimal Assistance - Patient > 75% Assistive device: Cane-quad    Walk 150 feet activity   Assist Walk 150 feet activity did not occur: Safety/medical concerns         Walk 10 feet on uneven surface  activity   Assist Walk 10 feet on uneven surfaces activity did not occur: Safety/medical concerns         Wheelchair     Assist Will patient use wheelchair at discharge?: Yes Type of Wheelchair: Manual    Wheelchair assist level: Supervision/Verbal cueing Max wheelchair distance: 186ft    Wheelchair 50 feet with 2 turns activity    Assist        Assist Level: Supervision/Verbal cueing   Wheelchair 150 feet activity     Assist      Assist Level: Supervision/Verbal cueing   Blood pressure (!) 148/64, pulse 70, temperature 97.7 F (36.5 C), resp. rate 17, height 5\' 7"  (1.702 m), weight 59 kg, SpO2 96 %.    Medical Problem List and Plan: 1.Decreased functional mobilitysecondary to right FNF.S/P righthemiarthroplasty anterior approach 01/21/2020. Weightbearing as tolerated. 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 Was at w/c level PTA  -Continue CIR PT, OT. ELOS 2/19 ALF vs SNF portion of [facility] 2. Antithrombotics: -DVT/anticoagulation:Lovenox. Negative vascular study -antiplatelet therapy: resume ASA 325mg  daily 3. Pain Management:limit to tylenol, ice pack for now given AMS.  -2/15: pain well controlled.  4. Mood:Prozac 40 mf daily -antipsychotic agents: N/A 5. Neuropsych: This patientiscapable of making decisions on hisown behalf. 6. Skin/Wound Care:Routine skin checks- sutures removed 2/14.   7. Fluids/Electrolytes/Nutrition:Routine in and outs with follow-up chemistries 8. Acute blood loss anemia.  Follow-up CBC 9. 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  .Pt received botox injections by Dr. Dimas Millin [facility] on 11/30/2019.Still has severe RUE and RLE tone -Patientalsoon baclofen 10 mg in the morning 20 mg nightly. -family to bring in resting WHO and day time splint from ALF -was using w/c predominantly PTA  2mg  QID tizanidine,0.25mg  Klonopin qhs, no evidence  for sedation  botulinum toxin as OP, will increase tizanidine to 4 mg 3 times daily  -Patient had increased drowsiness and struggled with therapy yesterday; will decrease Tizanidine back to 2mg  QID with an additional 2mg  HS to help him sleep (he did sleep better last night).  10. BPH. Resume Flomax 0.4 mg daily. 11.Hyperlipidemia. Lipitor 12. Constipation.   13. Confusion: Likely medication induced. Hydrocodone dc'ed already -would go ahead and hold robaxin too. -low grade temp, WBC normal --- negative  UA, UCX neg thus far  14. Insomnia:  On klonopin for hamstring spasms, this has helped sleep as well, monitor for sedation LOS: 14 days A FACE TO FACE EVALUATION WAS PERFORMED  Martha Clan P Marvon [place] 02/07/2020, 8:50 AM

## 2020-02-07 NOTE — Progress Notes (Signed)
Physical Therapy Session Note  Patient Details  Name: Joshua Shannon MRN: [id number] Date of Birth: October 23, 1941  Today's Date: 02/07/2020 PT Individual Time: 1302-1416 PT Individual Time Calculation (min): 74 min    Short Term Goals: Week 2:  PT Short Term Goal 1 (Week 2): STG = LTGs  Skilled Therapeutic Interventions/Progress Updates:    Patient seated in w/c finishing up lunch upon PT arrival, agreeable to therapy tx once finished pudding, denies pain today. Pt stated needing to go to the bathroom, continent of bladder & bowels, squat pivot transfer from w/c>toilet with maxA for input through RLE to dec extensor tone, pt remained in squat stance for therapist to assist totalA for clothing management > pt seated on commode, distant supervision to allow pt privacy while seated safely. Pt reported only urinating and having gas and performed pericare mod-I. Squat pivot transfer back to w/c with modA. Pt transported to gym in w/c for time management and energy conservation. Therapist performed multiple bouts of R hamstring stretch. Pt performed 2 x 10 reps R LAQ for quad activation prior to transfer activities. Remained of session focused on blocked practice of squat pivot transfer from w/c <> mat table, w/c <> BSC simulated like set up at ALF, and w/c <> car transfer. Pt required CGA-minA for transfer to L side and mod-maxA to R side. Therapist provided consistent cues for set up positioning, sequencing, hand placement, and technique. Throughout session, pt continued to attempt standing requiring max verbal cues to remain in low squat position for safety and therapist continued to educate pt on reasoning behind practicing squat pivot transfer to replicate safest transfer once discharged at this time. Pt performed transfer to R intermittently with minA if able to use simulated grab bar in front of him when transferring to a commode and using the car door to pull on with a second therapist stabilizing the door  for safety. Pt required emotional support/encouragement throughout session as he stated multiple times that he feels like he is regressing, educated pt on how RLE tone has implicated safety with standing and transfers and factors that may effect inc in tone. Transported pt back to room in w/c, squat pivot transfer to recliner with minA. Pt left in recliner with needs in reach and chair alarm set.   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 Restrictions Weight Bearing Restrictions: No RLE Weight Bearing: Weight bearing as tolerated Other Position/Activity Restrictions: WBAT RLE    Therapy/Group: Individual Therapy  Blima Ledger SPT  02/07/2020, 7:40 AM

## 2020-02-07 NOTE — Plan of Care (Signed)
  Problem: Consults Goal: RH GENERAL PATIENT EDUCATION Description: See Patient Education module for education specifics. Outcome: Progressing Goal: Skin Care Protocol Initiated - if Braden Score 18 or less Description: If consults are not indicated, leave blank or document N/A Outcome: Progressing   Problem: RH BLADDER ELIMINATION Goal: RH STG MANAGE BLADDER WITH ASSISTANCE Description: STG Manage Bladder With min Assistance Outcome: Progressing   Problem: RH SKIN INTEGRITY Goal: RH STG SKIN FREE OF INFECTION/BREAKDOWN Description: Pt will cont to be free of skin breakdown or infection during CIR stay with supervision assist Outcome: Progressing Goal: RH STG MAINTAIN SKIN INTEGRITY WITH ASSISTANCE Description: STG Maintain Skin Integrity With supervision Assistance. Outcome: Progressing Goal: RH STG ABLE TO PERFORM INCISION/WOUND CARE W/ASSISTANCE Description: STG Able To Perform Incision/Wound Care With min Assistance. Outcome: Progressing   Problem: RH SAFETY Goal: RH STG ADHERE TO SAFETY PRECAUTIONS W/ASSISTANCE/DEVICE Description: STG Adhere to Safety Precautions With cues/reminders Assistance/Device. Outcome: Progressing   

## 2020-02-07 NOTE — Progress Notes (Signed)
Physical Therapy Session Note  Patient Details  Name: Joshua Shannon MRN: [id number] Date of Birth: 03-23-1941  Today's Date: 02/07/2020 PT Individual Time: 0806-0900 PT Individual Time Calculation (min): 54 min   Short Term Goals: Week 2:  PT Short Term Goal 1 (Week 2): STG = LTGs  Skilled Therapeutic Interventions/Progress Updates:  Pt received in bed & agreeable to tx. NT assisting with donning BLE ted hose.  Therapist performed RLE hamstring stretch as pt with R hip & knee fully flexed at rest, MD in room to observe spasticity. Pt without c/o pain when therapist performed RLE hamstring stretch.  Therapist donned B socks, shoes & R GRAFO total assist for time management.  Supine>sit with min assist with hospital bed features. Sit<>stand x 5 from EOB with therapist approximating R knee to prevent RLE from coming off floor with focus on decreasing tone in extremity. Stand pivot bed>w/c with mod assist without AD, therapist approximating R knee.  Pt requests to brush teeth & does so at w/c level with supervision assist - pt able to retreive & open all items without assistance.  Pre gait standing at rail with therapist approximating R knee to ensure R foot remained flat on floor, with pt stepping forwards/backwards with LLE with focus on decreasing tone in RLE.  Gait training x 55 ft with [facility] with min/mod assist with therapist providing cuing to ensure QC remained flat on floor and assistance with RLE abduction as pt with adductor tone, but pt able to advance RLE without assistance.  Pt left in w/c with chair alarm donned, call bell & all needs in reach.  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 Restrictions Weight Bearing Restrictions: No RLE Weight Bearing: Weight bearing as tolerated Other Position/Activity Restrictions: WBAT RLE  Pain: Pt denies c/o pain.     Therapy/Group: Individual Therapy  Sandi Mariscal 02/07/2020, 9:12 AM

## 2020-02-08 ENCOUNTER — Inpatient Hospital Stay (HOSPITAL_COMMUNITY): Payer: Medicare Other | Admitting: Physical Therapy

## 2020-02-08 ENCOUNTER — Inpatient Hospital Stay (HOSPITAL_COMMUNITY): Payer: Medicare Other | Admitting: Occupational Therapy

## 2020-02-08 NOTE — Progress Notes (Signed)
Occupational Therapy Session Note  Patient Details  Name: [place] DUCRE MRN: [id number] Date of Birth: [birth year]  Today's Date: 02/08/2020 OT Individual Time: 1884-1660 OT Individual Time Calculation (min): 80 min    Short Term Goals: Week 2:  OT Short Term Goal 1 (Week 2): Pt will complete toilet transfer stand pivot with quadcane and mod assist. OT Short Term Goal 2 (Week 2): Pt will complete walk-in shower transfers with mod assist using the quadcane for support. OT Short Term Goal 3 (Week 2): Pt will complete LB bathing sit to stand with min assist. OT Short Term Goal 4 (Week 2): Pt will complete LB dressing sit to stand with min assist including AFO and shoes.  Skilled Therapeutic Interventions/Progress Updates:    Pt completed shower and dressing during session.  Min assist for squat pivot transfer to the wheelchair from the bed to the left.  He then needed mod assist for stand pivot transfer from the wheelchair to the shower bench, with increased flexor tone noted in the RLE.  He was able to complete bathing at overall mod assist.  With sit to stand he needed mod assist for balance secondary to increased flexor tone in the RLE and therapist being unable to block the RLE efficiently with the limited space.  He transferred stand pivot back to the wheelchair with mod assist in order to complete grooming tasks and dressing sit to stand at the sink.  He was able to donn his pullover shirt with supervision and increased time.  Lower body dressing was at mod assist sit to stand, with increased tone with initial standing in the RLE, but it did reduce slightly where he could maintain the RLE on the floor after standing for 20-30 seconds.  Therapist assisted with TEDs, shoes, and right AFO secondary to decreased time.  Therapist assisted with donning the right hand splint in order to position the right digits out of flexion.  Pt finished session with transfer to the recliner at mod assist stand pivot  with call button and phone.  Safety chair alarm in place as well.   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 Restrictions Weight Bearing Restrictions: No RLE Weight Bearing: Weight bearing as tolerated Other Position/Activity Restrictions: WBAT RLE   Pain: Pain Assessment Pain Scale: Faces Faces Pain Scale: Hurts a little bit Pain Type: Acute pain Pain Location: Hip Pain Orientation: Right Pain Descriptors / Indicators: Discomfort ADL: See Care Tool Section for some details of mobility and selfcare  Therapy/Group: Individual Therapy  Rion Catala OTR/L 02/08/2020, 12:43 PM

## 2020-02-08 NOTE — Plan of Care (Signed)
  Problem: RH Balance Goal: LTG Patient will maintain dynamic standing balance (PT) Description: LTG:  Patient will maintain dynamic standing balance with assistance during mobility activities (PT) Flowsheets (Taken 02/08/2020 1458) LTG: Pt will maintain dynamic standing balance during mobility activities with:: (downgraded based on pt progress) Minimal Assistance - Patient > 75% Note: downgraded based on pt progress   Problem: RH Bed to Chair Transfers Goal: LTG Patient will perform bed/chair transfers w/assist (PT) Description: LTG: Patient will perform bed to chair transfers with assistance (PT). Flowsheets (Taken 02/08/2020 1458) LTG: Pt will perform Bed to Chair Transfers with assistance level: (downgraded based on pt progress) Minimal Assistance - Patient > 75% Note: downgraded based on pt progress   Problem: RH Ambulation Goal: LTG Patient will ambulate in controlled environment (PT) Description: LTG: Patient will ambulate in a controlled environment, # of feet with assistance (PT). Flowsheets (Taken 02/08/2020 1458) LTG: Pt will ambulate in controlled environ  assist needed:: (downgraded based on pt progress) Minimal Assistance - Patient > 75% LTG: Ambulation distance in controlled environment: 69ft with LRAD Note: downgraded based on pt progress Goal: LTG Patient will ambulate in home environment (PT) Description: LTG: Patient will ambulate in home environment, # of feet with assistance (PT). Flowsheets (Taken 02/08/2020 1458) LTG: Pt will ambulate in home environ  assist needed:: (downgraded based on pt progress) Minimal Assistance - Patient > 75% LTG: Ambulation distance in home environment: 59ft with LRAD Note: downgraded based on pt progress   Problem: Sit to Stand Goal: LTG:  Patient will perform sit to stand with assistance level (PT) Description: LTG:  Patient will perform sit to stand with assistance level (PT) Flowsheets (Taken 02/08/2020 1458) LTG: PT will perform sit to  stand in preparation for functional mobility with assistance level: (downgraded based on pt progress) Minimal Assistance - Patient > 75% Note: downgraded based on pt progress

## 2020-02-08 NOTE — Progress Notes (Signed)
[place] PHYSICAL MEDICINE & REHABILITATION PROGRESS NOTE   Subjective/Complaints:  Feels depressed , noted that pt experienced lethargy on higher dose tizanidine  ROS-  no abd pain  Or N/V.  No CP or SOB  Objective:   No results found. No results for input(s): WBC, HGB, HCT, PLT in the last 72 hours. Recent Labs    02/07/20 0721  CREATININE 0.83    Intake/Output Summary (Last 24 hours) at 02/08/2020 0740 Last data filed at 02/08/2020 0200 Gross per 24 hour  Intake 440 ml  Output 200 ml  Net 240 ml     Physical Exam: Vital Signs Blood pressure 134/65, pulse 87, temperature 98.4 F (36.9 C), temperature source Oral, resp. rate 18, height 5\' 7"  (1.702 m), weight 59 kg, SpO2 94 %. General: No acute distress. Sitting up in bedside chair; comfortable except for R hand- rubbing R hand and R leg constantly, NAD  Mood and affect are appropriate Heart: Regular rate and rhythm no rubs murmurs or extra sounds Lungs: Clear to auscultation, breathing unlabored, no rales or wheezes Abdomen: Positive bowel sounds, soft nontender to palpation, nondistended Extremities: No clubbing, cyanosis, or edema Skin: No evidence of breakdown, no evidence of rash Neurologic: Cranial nerves II through XII intact, motor strength is 5/5 in LEFT deltoid, bicep, tricep, grip, hip flexor, knee extensors, ankle dorsiflexor and plantar flexor 0/5 RUE, 0/5 LLE in all muscle groups  Tone- MAS 3 in R elbow flexors, mAS 4 in RIght finger flexors; clawing; difficulty to get into R hand splint MAS 3 in R Hamstrings and quads.  Continues to be tight, especially on right side, patient's  hamstring feels better with stretching Sensory exam normal sensation to light touch and proprioception in bilateral upper and lower extremities Cerebellar exam normal finger to nose to finger as well as heel to shin in bilateral upper and lower extremities Musculoskeletal: Full range of motion in all 4 extremities. No joint swelling     Assessment/Plan: 1. Functional deficits secondary to RIght hip fracture due to fall in a pt with chronic spastic hemi which require 3+ hours per day of interdisciplinary therapy in a comprehensive inpatient rehab setting.  Physiatrist is providing close team supervision and 24 hour management of active medical problems listed below.  Physiatrist and rehab team continue to assess barriers to discharge/monitor patient progress toward functional and medical goals  Care Tool:  Bathing    Body parts bathed by patient: Right arm, Chest, Abdomen, Front perineal area, Right upper leg, Left upper leg, Right lower leg, Left lower leg, Face   Body parts bathed by helper: Buttocks, Left arm Body parts n/a: Left arm, Buttocks   Bathing assist Assist Level: Moderate Assistance - Patient 50 - 74%     Upper Body Dressing/Undressing Upper body dressing   What is the patient wearing?: Pull over shirt    Upper body assist Assist Level: Supervision/Verbal cueing    Lower Body Dressing/Undressing Lower body dressing      What is the patient wearing?: Pants, Incontinence brief     Lower body assist Assist for lower body dressing: Moderate Assistance - Patient 50 - 74%     Toileting Toileting    Toileting assist Assist for toileting: Total Assistance - Patient < 25%     Transfers Chair/bed transfer  Transfers assist     Chair/bed transfer assist level: Moderate Assistance - Patient 50 - 74%     Locomotion Ambulation   Ambulation assist   Ambulation activity  did not occur: Safety/medical concerns  Assist level: Moderate Assistance - Patient 50 - 74% Assistive device: Cane-quad Max distance: 55 ft   Walk 10 feet activity   Assist  Walk 10 feet activity did not occur: Safety/medical concerns  Assist level: Moderate Assistance - Patient - 50 - 74% Assistive device: Cane-quad   Walk 50 feet activity   Assist Walk 50 feet with 2 turns activity did not occur:  Safety/medical concerns  Assist level: Moderate Assistance - Patient - 50 - 74% Assistive device: Cane-quad    Walk 150 feet activity   Assist Walk 150 feet activity did not occur: Safety/medical concerns         Walk 10 feet on uneven surface  activity   Assist Walk 10 feet on uneven surfaces activity did not occur: Safety/medical concerns         Wheelchair     Assist Will patient use wheelchair at discharge?: Yes Type of Wheelchair: Manual    Wheelchair assist level: Supervision/Verbal cueing Max wheelchair distance: 181ft    Wheelchair 50 feet with 2 turns activity    Assist        Assist Level: Supervision/Verbal cueing   Wheelchair 150 feet activity     Assist      Assist Level: Supervision/Verbal cueing   Blood pressure 134/65, pulse 87, temperature 98.4 F (36.9 C), temperature source Oral, resp. rate 18, height 5\' 7"  (1.702 m), weight 59 kg, SpO2 94 %.    Medical Problem List and Plan: 1.Decreased functional mobilitysecondary to right FNF.S/P righthemiarthroplasty anterior approach 01/21/2020. Weightbearing as tolerated. 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 Was at w/c level PTA  -Continue CIR PT, OT team conf in am . ELOS 2/19 ALF vs SNF portion of [facility] 2. Antithrombotics: -DVT/anticoagulation:Lovenox. Negative vascular study -antiplatelet therapy: resume ASA 325mg  daily 3. Pain Management:limit to tylenol, ice pack for now given AMS.  -2/15: pain well controlled.  4. Mood:Prozac 40 mf daily, "depression may be residual effect of higher dose tizanidine  -antipsychotic agents: N/A 5. Neuropsych: This patientiscapable of making decisions on hisown behalf. 6. Skin/Wound Care:Routine skin checks- sutures removed 2/14.   7. Fluids/Electrolytes/Nutrition:Routine in and outs with follow-up chemistries 8. Acute blood loss anemia. Follow-up CBC 9.  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  .Pt received botox injections by Dr. 12-18-1976 [facility] on 11/30/2019.Still has severe RUE and RLE tone -Patientalsoon baclofen 10 mg in the morning 20 mg nightly. -family to bring in resting WHO and day time splint from ALF -was using w/c predominantly PTA  2mg  QID tizanidine QHS ,0.25mg  Klonopin qhs, became sedated on tizanidine to 4 mg 3 times daily  .  10. BPH. Resume Flomax 0.4 mg daily. 11.Hyperlipidemia. Lipitor 12. Constipation.   13. Confusion: Likely medication induced. Hydrocodone dc'ed already -would go ahead and hold robaxin too. -low grade temp, WBC normal --- negative  UA, UCX neg  14. Insomnia:  On klonopin for hamstring spasms, this has helped sleep as well, monitor for sedation- per wife was on Ativan at home  LOS: 15 days A FACE TO FACE EVALUATION WAS PERFORMED  [facility] 02/08/2020, 7:40 AM

## 2020-02-08 NOTE — Progress Notes (Signed)
Occupational Therapy Weekly Progress Note  Patient Details  Name: Joshua Shannon MRN: [id number] Date of Birth: 11/15/1941  Beginning of progress report period: February 01, 2020 End of progress report period: February 08, 2020  Today's Date: 02/08/2020 OT Individual Time: 4098-1191 OT Individual Time Calculation (min): 37 min    Patient has met 2 of 4 short term goals.  Pt is making slow but steady progress with OT at this time.  He currently completes UB selfcare with min assist to supervision following his previously learned hemi techniques.  He is able to complete LB bathing and dressing with mod assist sit to stand as well.  RLE tone still limits his ability to complete standing tasks as well as his ability to complete stand pivot transfers.  Because of this, we have begun to focus more on squat pivot transfers to help increased independence with bed to chair and wheelchair to toilet transfers until his tone is reduced.  Feel he will likely need greater assist than previously thought secondary to his tone in the RUE and RLE being worse than prior to his fall.  He continues to tolerate stretching to the right shoulder, elbow, and hand as well as some NMES to the digit extensors.  Resting hand splint is also being worn the majority of the time for positioning.  Feel he will continue to benefit from continued CIR level therapy to progress to a min to mod assist level with transition to SNF per requirement from his facility for further therapy.      Patient continues to demonstrate the following deficits: muscle weakness and muscle joint tightness, impaired timing and sequencing, abnormal tone, unbalanced muscle activation and decreased coordination and decreased sitting balance, decreased standing balance, decreased postural control, hemiplegia and decreased balance strategies and therefore will continue to benefit from skilled OT intervention to enhance overall performance with BADL and Reduce care  partner burden.  Patient not progressing toward long term goals.  See goal revision..  Continue plan of care.  OT Short Term Goals Week 3:  OT Short Term Goal 1 (Week 3): Pt will complete LB bathing sit to stand with min assist. OT Short Term Goal 2 (Week 3): Pt will complete LB dressing sit to stand with min assist including AFO and shoes. OT Short Term Goal 3 (Week 3): Pt will complete squat pivot transfers to the 3:1 with min assist. OT Short Term Goal 4 (Week 3): Pt will complete LB clothing management with min assist sit to stand during toileting tasks.  Skilled Therapeutic Interventions/Progress Updates:    Pt completed supine to sit with min assist to start session.  He then worked on Clinical research associate with focus on elbow extension of the RUE to target.  Therapist provided max facilitation for opening the right hand as well as mod facilitation for end range elbow extension.  Transitioned to having him work on weightbearing through the RUE on the side of the bed with max facilitation from therapist for maintaining position of the hand, while reaching to target in front with the left hand.  He completed transitions of sit to squat with RUE in weightbearing and mod assist to clear his buttocks and maintain.  Finished session with placement of resting hand splint back on the RUE and pt removing his shoes and AFO with min assist.  Min assist for sit to supine with call button and phone in reach.    Therapy Documentation Precautions:  Precautions Precautions: Fall Precaution Comments: residual  R sided weakness from previous CVA in May of 2020 Restrictions Weight Bearing Restrictions: No RLE Weight Bearing: Weight bearing as tolerated Other Position/Activity Restrictions: WBAT RLE  Pain: Pain Assessment Pain Scale: Faces Faces Pain Scale: Hurts a little bit Pain Type: Surgical pain Pain Location: Hip Pain Orientation: Left Pain Descriptors / Indicators: Discomfort Pain  Onset: With Activity Pain Intervention(s): Repositioned  Therapy/Group: Individual Therapy  Boston Cookson OTR/L 02/08/2020, 4:43 PM

## 2020-02-08 NOTE — Plan of Care (Signed)
  Problem: Consults Goal: RH GENERAL PATIENT EDUCATION Description: See Patient Education module for education specifics. Outcome: Progressing Goal: Skin Care Protocol Initiated - if Braden Score 18 or less Description: If consults are not indicated, leave blank or document N/A Outcome: Progressing   Problem: RH BLADDER ELIMINATION Goal: RH STG MANAGE BLADDER WITH ASSISTANCE Description: STG Manage Bladder With min Assistance Outcome: Progressing   Problem: RH SKIN INTEGRITY Goal: RH STG SKIN FREE OF INFECTION/BREAKDOWN Description: Pt will cont to be free of skin breakdown or infection during CIR stay with supervision assist Outcome: Progressing Goal: RH STG MAINTAIN SKIN INTEGRITY WITH ASSISTANCE Description: STG Maintain Skin Integrity With supervision Assistance. Outcome: Progressing Goal: RH STG ABLE TO PERFORM INCISION/WOUND CARE W/ASSISTANCE Description: STG Able To Perform Incision/Wound Care With min Assistance. Outcome: Progressing   Problem: RH SAFETY Goal: RH STG ADHERE TO SAFETY PRECAUTIONS W/ASSISTANCE/DEVICE Description: STG Adhere to Safety Precautions With cues/reminders Assistance/Device. Outcome: Progressing   

## 2020-02-08 NOTE — Plan of Care (Signed)
  Problem: RH Balance Goal: LTG Patient will maintain dynamic standing with ADLs (OT) Description: LTG:  Patient will maintain dynamic standing balance with assist during activities of daily living (OT)  Flowsheets (Taken 02/08/2020 1712) LTG: Pt will maintain dynamic standing balance during ADLs with: (goal downgraded based on progress) Moderate Assistance - Patient 50 - 74%   Problem: Sit to Stand Goal: LTG:  Patient will perform sit to stand in prep for activites of daily living with assistance level (OT) Description: LTG:  Patient will perform sit to stand in prep for activites of daily living with assistance level (OT) Flowsheets (Taken 02/08/2020 1712) LTG: PT will perform sit to stand in prep for activites of daily living with assistance level: (goal downgraded based on progress) Minimal Assistance - Patient > 75%   Problem: RH Toileting Goal: LTG Patient will perform toileting task (3/3 steps) with assistance level (OT) Description: LTG: Patient will perform toileting task (3/3 steps) with assistance level (OT)  Flowsheets (Taken 02/08/2020 1712) LTG: Pt will perform toileting task (3/3 steps) with assistance level: (goal downgraded based on progress) Moderate Assistance - Patient 50 - 74%   Problem: RH Functional Use of Upper Extremity Goal: LTG Patient will use RT/LT upper extremity as a (OT) Description: LTG: Patient will use right/left upper extremity as a stabilizer/gross assist/diminished/nondominant/dominant level with assist, with/without cues during functional activity (OT) Flowsheets (Taken 02/08/2020 1712) LTG: Use of upper extremity in functional activities: (goal downgraded based on progress) RUE as a stabilizer LTG: Pt will use upper extremity in functional activity with assistance level of: Maximal Assistance - Patient 25 - 49%   Problem: RH Tub/Shower Transfers Goal: LTG Patient will perform tub/shower transfers w/assist (OT) Description: LTG: Patient will perform  tub/shower transfers with assist, with/without cues using equipment (OT) Flowsheets (Taken 02/08/2020 1712) LTG: Pt will perform tub/shower stall transfers with assistance level of: (goal downgraded based on progress) Moderate Assistance - Patient 50 - 74% LTG: Pt will perform tub/shower transfers from: Walk in shower

## 2020-02-08 NOTE — Progress Notes (Signed)
Physical Therapy Session Note  Patient Details  Name: Joshua Shannon MRN: [id number] Date of Birth: 01-Feb-1941  Today's Date: 02/08/2020 PT Individual Time: [phone] and [id number] PT Individual Time Calculation (min): 47 min and 59 min  Short Term Goals: Week 2:  PT Short Term Goal 1 (Week 2): STG = LTGs  Skilled Therapeutic Interventions/Progress Updates:    Session 1: Patient supine in bed upon PT arrival, agreeable to therapy tx, denies pain. Pt reported feeling sleepy as he did not sleep well again last night d/t muscle cramping in RLE. Pt reported needing to use bathroom, continent of bladder, therapist minA for urinal placement, distant supervision given for pt privacy. Pt reported feeling depressed d/t feeling like he is getting worse, therapist provided emotional support and encouragement. Therapist donned TEDs and socks totalA and pants maxA for LE threading, pt assist to complete pulling up pants with performing sustained bridge in hooklying. Supine > sitting EOB CGA for safety. In sitting, therapist donned shoes and GRAFO totalA for time management. When pt sitting, therapist provided multiple bouts of R HS stretching low load long duration throughout entire session followed by pt performing R LAQ x 10 reps, completely 4 sets in session, demonstrated improved active ROM with last few reps each set. Squat pivot transfer to L > w/c with modA for blocking R knee and providing input for RLE extension to keep foot on floor and assist at hips to shift towards w/c, cues for hand/feet placement, sequencing, and remaining in low squat position. Transported pt in w/c to rehab gym for time management and energy conservation. Squat pivot transfer L > mat table with modA. Sit EOM > standing with maxA from therapist for weight shifting, input through RLE to come upright, anterior trunk lean and cues for sequencing and positioning, tolerated ~2 minutes of standing balance with intermittent min-maxA d/t  posterior trunk lean, performed x 4 reps. During last bout of standing, once pt stable statically, pt performed fwd/retro step with LLE and mod-maxA to facilitate inc WB through RLE and weight shifting and provide input through RLE to extend, cues given for to extend R knee first > weight shift over RLE > then step with LLE forward, cues for posture and anterior trunk lean to prevent posterior LOB. Squat pivot transfer mat > w/c with minA as pt used LUE to assist pulling self toward chair. Transported back to room in w/c. Squat pivot transfer to R into bed, d/t pt's fatigue and wanting to take a nap, with maxA d/t R sided weakness and inability to utilize RUE to assist, continued cueing as previously stated. Therapist totalA doffed shoed, left pt in bed with needs in reach and bed alarm set.    Session 2: Pt seated in recliner upon PT arrival, agreeable to therapy tx, denies pain. Pt appeared very anxious and frustrated stating "I'm at my wits end", therapist acknowledged pt and provided emotional support, pt asked to get his food tray away from him. Therapist noted pt had barely touched his meal, pt reported his stomach not feeling well and trying to call for the nurse multiple times to go to the bathroom but call bell was not working. Therapist noted call bell was not plugged into wall outlet, after plugging it back it, tested alarm and it worked. Squat pivot transfer L from recliner > w/c with minA for input through RLE and cues for sequencing and positioning. Transported pt to bathroom in w/c > performed squat pivot L to commode with  minA, pt remained in low squat position for clothing management, therapist provided maxA, distant supervision for pt privacy while voiding. Pt was continent of both bowel & bladder and reported having diarrhea. Pt performed pericare with supervision. Sit > stand for clothing management totalA, modA for input through RLE as pt presents with significant flexor tone >  squat pivot  transfer R to w/c with modA. Transported pt in w/c to ortho gym, orthotist present for session. Therapist donned new GRAFO and shoe totalA for time management. Pt performed x 10 R LAQ for muscle activation. Initially, pt required min-modA for sit > stand however throughout session improved anterior trunk lean and weight shifting requiring CGA-minA. Pt ambulated 31' + 20' with minA for RLE placement and swing through and to facilitate weight shifting, cues for sequencing and technique. Pt performed standing balance x 5 minutes to re-learn midline orientation as pt demonstrated dec weight shift over RLE leading to posterior trunk lean likely result of fear avoidance behavior, with intermittent supervision-minA for weight shifting and stability. Orthotist discussed potential benefit of using knee immobilizer while pt in bed and to wear the brace in bed to act as low load long duration stretch to dec flexor tone. Transported pt back to room in w/c for time management. Once in room, pt apologized for not being able to do what is asked of him in regards to transfers and gait and expressed frustration towards himself. He stated feeling depressed and anxious which may also be impacting his sleep pattern. Therapy provided emotional support and encouragement to pt for extended time and brought up talking to neuropsych if pt approved. Pt agreed to neuropsych consult. Therapist communicated with SW to set up consult.  Pt left in w/c with needs in reach and chair alarm set.   Addendum: Therapist returned to pt's room to grab used AFOs and pt's wife was on the phone asking to speak with therapist. She reports her concerns regarding pt's level of depression and that he also said to her that he was at his "wits end." Therapist expressed that pt appeared more fatigued today and had reported note sleeping well - notified her of plan for neuropsych consult Wednesday as well as recommended speaking further with pt's PA to discuss  sleep pattern and medications.    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 Restrictions Weight Bearing Restrictions: No RLE Weight Bearing: Weight bearing as tolerated Other Position/Activity Restrictions: WBAT RLE    Therapy/Group: Individual Therapy  Juliann Pulse SPT 02/08/2020, 7:39 AM

## 2020-02-09 ENCOUNTER — Encounter (HOSPITAL_COMMUNITY): Payer: Medicare Other | Admitting: Psychology

## 2020-02-09 ENCOUNTER — Inpatient Hospital Stay (HOSPITAL_COMMUNITY): Payer: Medicare Other | Admitting: Physical Therapy

## 2020-02-09 ENCOUNTER — Inpatient Hospital Stay (HOSPITAL_COMMUNITY): Payer: Medicare Other | Admitting: Occupational Therapy

## 2020-02-09 DIAGNOSIS — R413 Other amnesia: Secondary | ICD-10-CM

## 2020-02-09 DIAGNOSIS — Z8659 Personal history of other mental and behavioral disorders: Secondary | ICD-10-CM

## 2020-02-09 LAB — CBC
HCT: 35.6 % — ABNORMAL LOW (ref 39.0–52.0)
Hemoglobin: 11.4 g/dL — ABNORMAL LOW (ref 13.0–17.0)
MCH: 28.7 pg (ref 26.0–34.0)
MCHC: 32 g/dL (ref 30.0–36.0)
MCV: 89.7 fL (ref 80.0–100.0)
Platelets: 462 10*3/uL — ABNORMAL HIGH (ref 150–400)
RBC: 3.97 MIL/uL — ABNORMAL LOW (ref 4.22–5.81)
RDW: 12.8 % (ref 11.5–15.5)
WBC: 5 10*3/uL (ref 4.0–10.5)
nRBC: 0 % (ref 0.0–0.2)

## 2020-02-09 NOTE — Patient Care Conference (Signed)
Inpatient RehabilitationTeam Conference and Plan of Care Update Date: 02/09/2020   Time: 10:30 AM   Patient Name: Joshua Shannon      Medical Record Number: [id number]  Date of Birth: Nov 09, 1941 Sex: Male         Room/Bed: 4W13C/4W13C-01 Payor Info: Payor: MEDICARE / Plan: MEDICARE PART A AND B / Product Type: *No Product type* /    Admit Date/Time:  01/24/2020  4:44 PM  Primary Diagnosis:  Right femoral fracture [facility])  Patient Active Problem List   Diagnosis Date Noted  . Right femoral fracture ([place]) 01/24/2020  . Closed displaced fracture of right femoral neck ([place]) 01/20/2020  . Fall at home, initial encounter 01/20/2020    Expected Discharge Date: Expected Discharge Date: 02/11/20(Plans to DC to [facility] initially, bed open 2/19.)  Team Members Present: Physician leading conference: Dr. Alysia Penna Social Worker Present: Lennart Pall, LCSW Nurse Present: Other (comment);Dorien Chihuahua, RN(Blair Montanti, LPN) Case Manager: Karene Fry, RN PT Present: Burnard Bunting, PT OT Present: Clyda Greener, OT SLP Present: Jettie Booze, CF-SLP PPS Coordinator present : Gunnar Fusi, SLP     Current Status/Progress Goal Weekly Team Focus  Bowel/Bladder   continent of b/b; LBM: 02/15  remain continent of b/b; gain regular bowel pattern  assist with toileting needs prn   Swallow/Nutrition/ Hydration         Supervision to min assist for UB selfcare, mod assist for LB selfcare, mod assist for transfers stand pivot.  Min assist for squat pivots   ADL's   min to mod assist  selfcare retraining, balance retraining, transfer training, NMES, neuromuscular re-education, pt/family education      Mobility   varying assist levels depending on fatigue, time of day, and flexor tone - min-modA transfers, modA initially progressed to minA gait with QC (intermittent facilitation for wt shift over RLE and swing phase), supervision w/c propulsion, pt has reported on recently feeling depressed and like he's  getting worse  minA for gait and transfers, CGA bed mobility  gait training, standing balance, RLE tone management, squat pivot transfers, bed mobility, R LE NMR   Communication             Safety/Cognition/ Behavioral Observations            Pain   c/o to RLE; scheduled zanaflex and prn tylenol, ultracet  pain level <5/10  assess pain QS and prn   Skin   R hip incision OTA, healing  remain free of new skin breakdown/infection  assess QS and prn    Rehab Goals Patient on target to meet rehab goals: Yes *See Care Plan and progress notes for long and short-term goals.     Barriers to Discharge  Current Status/Progress Possible Resolutions Date Resolved   Nursing                  PT                    OT                  SLP                SW Decreased caregiver support;Lack of/limited family support Plan to discharge to [facility] for quarantine period of 14 days then transition to ALF portion [facility] accepted request//SNF bed confirmed for 02/11/20; will quarantine for 14 days then return to ALF          Discharge Planning/Teaching  Needs:  Plan to discharge back to [facility] ; will go to SNF for 14 day quarantine period and then back to ALF  TBD; ALF/SNF staff do not need education   Team Discussion: Severe spasticity, meds adjusted, can't do botox injections yet.  RN cont B/B.  OT S UB self care, mod A LB self care, mod A transfers, min/mod squat pivot, increased tone r leg/arm/hand, goals min/mod A.  PT min/mod transfers, goals min A for gait and transfers, feels depressed/anxious, neuropsych to see today.  SLP none.  To DC to [facility] nursing home, bed available 02/11/20.   Revisions to Treatment Plan: N/A     Medical Summary Current Status: Spasticity both upper and lower limb, trial tizanidine seems to be working a little better than the baclofen.  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 Possible Resolutions to Barriers: See above   Continued Need for Acute Rehabilitation Level of Care: The patient requires daily medical management by a physician with specialized training in physical medicine and rehabilitation for the following reasons: Direction of a multidisciplinary physical rehabilitation program to maximize functional independence : Yes Medical management of patient stability for increased activity during participation in an intensive rehabilitation regime.: Yes Analysis of laboratory values and/or radiology reports with any subsequent need for medication adjustment and/or medical intervention. : Yes   I attest that I was present, lead the team conference, and concur with the assessment and plan of the team.   Lelon Frohlich M 02/09/2020, 2:27 PM   Team conference was held via web/ teleconference due to COVID - 19

## 2020-02-09 NOTE — Progress Notes (Signed)
Physical Therapy Session Note  Patient Details  Name: Joshua Shannon MRN: [id number] Date of Birth: September 07, 1941  Today's Date: 02/09/2020 PT Individual Time: 4010-2725 PT Individual Time Calculation (min): 60 min   Short Term Goals: Week 1:  PT Short Term Goal 1 (Week 1): Pt will initiate gait training PT Short Term Goal 1 - Progress (Week 1): Met PT Short Term Goal 2 (Week 1): Pt will perform bed<>chair transfers with mod assist PT Short Term Goal 2 - Progress (Week 1): Met PT Short Term Goal 3 (Week 1): Pt will perform bed mobility supine<>sit with min assist PT Short Term Goal 3 - Progress (Week 1): Met  Skilled Therapeutic Interventions/Progress Updates: Pt presented in w/c agreeable to therapy. Pt presented with R KI which PTA doffed for therapy. Pt transported to rehab gym for energy conservation. Performed blocked practice squat pivot transfers to from mat from L and R sides. Performed minA to R with mod cues for sequencing and modA to L. PTA blocked R foot decrease knee flexion while performing activity. Pt also responded well to tactile cues for increasing anterior wt shifting. PTA then performed HS stretch x 1 min followed by LAQ AROM x 5 for 3 bouts. Participated in STS x 2 with [facility] then x 1 with wall rail. Pt required modA initially when standing due to notable posterior lean which required mod multimodal cues for correction. Pt also required cues for maintaining all 4 prongs of SBQC on floor. When performed with wall rail pt was more responsive to feedback to shifting wt forward. Pt then participated in gait training x 48f with wall rail with modA fading to minA with PTA providing intermittent assist for clearing R foot (shoe sticking to floor) and to decrease adduction. Pt then ambulated 437fwith SBQC and consistent minA. Pt noted to have no posteior lean with SBQC and PTA only provided assistance to minimize adduction for first few feet then pt was able to maintain on own. Pt  transported back to room at end of session and remained in w/c with half lap tray in place, belt alarm on, call bell within reach and needs met.      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

## 2020-02-09 NOTE — Discharge Summary (Signed)
Physician Discharge Summary  Patient ID: Joshua Shannon MRN: [id number] DOB/AGE: September 16, 1941 79 y.o.  Admit date: 01/24/2020 Discharge date: 02/11/2020  Discharge Diagnoses:  Principal Problem:   Right femoral fracture (HCC) Active Problems:   Memory loss   History of depression DVT prophylaxis Acute blood loss anemia seen CVA with right side residual weakness BPH Hyperlipidemia  Discharged Condition: Stable  Significant Diagnostic Studies: DG Chest 1 View  Result Date: 01/20/2020 CLINICAL DATA:  Right hip fracture EXAM: CHEST  1 VIEW COMPARISON:  None. FINDINGS: The heart size and mediastinal contours are within normal limits. Aortic atherosclerosis. Both lungs are clear. The visualized skeletal structures are unremarkable. IMPRESSION: No active disease. Electronically Signed   By: Jasmine Pang M.D.   On: 01/20/2020 20:26   DG Pelvis 1-2 Views  Result Date: 01/20/2020 CLINICAL DATA:  Larey Seat onto right hip EXAM: PELVIS - 1-2 VIEW COMPARISON:  None. FINDINGS: Metallic fragment or hardware over the upper sacrum. Pubic symphysis and rami appear intact. No fracture or malalignment left hip. Acute mildly displaced right femoral neck fracture. IMPRESSION: Acute right femoral neck fracture Electronically Signed   By: Jasmine Pang M.D.   On: 01/20/2020 20:25   Pelvis Portable  Result Date: 01/21/2020 CLINICAL DATA:  Right hip replacement EXAM: PORTABLE PELVIS 1-2 VIEWS COMPARISON:  January 20, 2020 FINDINGS: The patient is status post right hip replacement. Hardware is in good position. IMPRESSION: Patient is status post right hip replacement.  No other change. Electronically Signed   By: Gerome Sam III M.D   On: 01/21/2020 16:57   DG C-Arm 1-60 Min  Result Date: 01/21/2020 CLINICAL DATA:  Right hip replacement EXAM: DG C-ARM 1-60 MIN FLUOROSCOPY TIME:  Fluoroscopy Time:  9 seconds Number of Acquired Spot Images: 2 COMPARISON:  None. FINDINGS: Imaging was obtained after right hip  replacement. Hardware is in good position on AP imaging. IMPRESSION: Right hip replacement as above. Electronically Signed   By: Gerome Sam III M.D   On: 01/21/2020 16:56   DG HIP OPERATIVE UNILAT WITH PELVIS RIGHT  Result Date: 01/21/2020 CLINICAL DATA:  Right hip hemiarthroplasty. EXAM: OPERATIVE RIGHT HIP (WITH PELVIS IF PERFORMED) 2 VIEWS TECHNIQUE: Fluoroscopic spot image(s) were submitted for interpretation post-operatively. COMPARISON:  01/20/2020. FINDINGS: Total right hip replacement with anatomic alignment. Hardware intact. IMPRESSION: Total right hip replacement anatomic alignment. Electronically Signed   By: Maisie Fus  Register   On: 01/21/2020 16:05   DG Femur Min 2 Views Right  Result Date: 01/20/2020 CLINICAL DATA:  Fall onto right hip EXAM: RIGHT FEMUR 2 VIEWS COMPARISON:  None. FINDINGS: Acute right femoral neck fracture. No femoral head dislocation. Mid to distal femur show no fracture or malalignment. Vascular calcifications. IMPRESSION: Acute right femoral neck fracture Electronically Signed   By: Jasmine Pang M.D.   On: 01/20/2020 20:26   VAS [place] LOWER EXTREMITY VENOUS (DVT)  Result Date: 01/25/2020  Lower Venous Study Indications: Swelling.  Risk Factors: None identified. Comparison Study: No prior studies. Performing Technologist: Chanda Busing RVT  Examination Guidelines: A complete evaluation includes B-mode imaging, spectral Doppler, color Doppler, and power Doppler as needed of all accessible portions of each vessel. Bilateral testing is considered an integral part of a complete examination. Limited examinations for reoccurring indications may be performed as noted.  +---------+---------------+---------+-----------+----------+--------------+ RIGHT    CompressibilityPhasicitySpontaneityPropertiesThrombus Aging +---------+---------------+---------+-----------+----------+--------------+ CFV      Full           Yes      Yes                                  +---------+---------------+---------+-----------+----------+--------------+  SFJ      Full                                                        +---------+---------------+---------+-----------+----------+--------------+ FV Prox  Full                                                        +---------+---------------+---------+-----------+----------+--------------+ FV Mid   Full                                                        +---------+---------------+---------+-----------+----------+--------------+ FV DistalFull                                                        +---------+---------------+---------+-----------+----------+--------------+ PFV      Full                                                        +---------+---------------+---------+-----------+----------+--------------+ POP      Full           Yes      Yes                                 +---------+---------------+---------+-----------+----------+--------------+ PTV      Full                                                        +---------+---------------+---------+-----------+----------+--------------+ PERO     Full                                                        +---------+---------------+---------+-----------+----------+--------------+   +---------+---------------+---------+-----------+----------+--------------+ LEFT     CompressibilityPhasicitySpontaneityPropertiesThrombus Aging +---------+---------------+---------+-----------+----------+--------------+ CFV      Full           Yes      Yes                                 +---------+---------------+---------+-----------+----------+--------------+ SFJ      Full                                                        +---------+---------------+---------+-----------+----------+--------------+  FV Prox  Full                                                         +---------+---------------+---------+-----------+----------+--------------+ FV Mid   Full                                                        +---------+---------------+---------+-----------+----------+--------------+ FV DistalFull                                                        +---------+---------------+---------+-----------+----------+--------------+ PFV      Full                                                        +---------+---------------+---------+-----------+----------+--------------+ POP      Full           Yes      Yes                                 +---------+---------------+---------+-----------+----------+--------------+ PTV      Full                                                        +---------+---------------+---------+-----------+----------+--------------+ PERO     Full                                                        +---------+---------------+---------+-----------+----------+--------------+     Summary: Right: There is no evidence of deep vein thrombosis in the lower extremity. No cystic structure found in the popliteal fossa. Left: There is no evidence of deep vein thrombosis in the lower extremity. No cystic structure found in the popliteal fossa.  *See table(s) above for measurements and observations. Electronically signed by Coral Else MD on 01/25/2020 at 3:20:54 PM.    Final     Labs:  Basic Metabolic Panel: Recent Labs  Lab 02/07/20 0721  CREATININE 0.83    CBC: Recent Labs  Lab 02/09/20 0957  WBC 5.0  HGB 11.4*  HCT 35.6*  MCV 89.7  PLT 462*    CBG: No results for input(s): GLUCAP in the last 168 hours.  Family history.  Mother and father with history of hypertension and hyperlipidemia.  Denies any diabetes mellitus colon cancer or rectal cancer  Brief HPI:   SIGISMUND CROSS is a 79 y.o. right-handed male with a history of hyperlipidemia, depression, CVA with right side residual  weakness and spasticity  as well as memory impairments maintained on aspirin as well as baclofen.  Patient currently resides at [facility] assisted [facility] facility.  Presented 01/20/2020 after mechanical fall without loss of consciousness.  Patient states he fell out of his wheelchair with right hip pain.  X-rays and imaging revealed right femoral neck fracture.  Underwent right hip hemiarthroplasty anterior approach 01/21/2020 per Dr.Xu.  Weightbearing as tolerated right lower extremity.  Lovenox for DVT prophylaxis.  Acute blood loss anemia 10.7 and monitored.  Patient was admitted for a comprehensive rehab program.   Hospital Course: MARSHAWN NORMOYLE was admitted to rehab 01/24/2020 for inpatient therapies to consist of PT, ST and OT at least three hours five days a week. Past admission physiatrist, therapy team and rehab RN have worked together to provide customized collaborative inpatient rehab.  Pertaining to patient's right femoral neck fracture he had undergone right hemiarthroplasty anterior approach 01/21/2020.  Weightbearing as tolerated.  Neurovascular sensation intact he would follow-up with orthopedic services as directed.  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  In regards to patient's spasticity after CVA he had received Botox injections by Dr. Leanna Battles at [facility] 11/30/2019 he did continue on baclofen as directed as well as the addition of low-dose Zanaflex.  Blood pressure was controlled no orthostasis noted.  Mood stabilization with Prozac he was attending full therapies.  He continued on Lipitor for history of hyperlipidemia.  Flomax ongoing for BPH voiding without difficulty.  Bouts of constipation resolved with laxative assistance.  Lovenox for DVT prophylaxis venous Doppler studies negative.   Blood pressures were monitored on TID basis and controlled   He/ has made gains during rehab stay and is attending therapies  He/ will continue to receive  follow up therapies   after discharge  Rehab course: During patient's stay in rehab weekly team conferences were held to monitor patient's progress, set goals and discuss barriers to discharge. At admission, patient required max assist for rolling, max assist stand pivot transfers.  Moderate assist upper body bathing max assist lower body bathing max assist upper body dressing max assist lower body dressing  Physical exam.  Blood pressure 171/90 pulse 98 temperature 98.3 respirations 17 oxygen saturation 96% room air Constitutional.  No distress frail-appearing HEENT. Head.  Normocephalic and atraumatic Eyes.  Pupils round and reactive to light no discharge without nystagmus Neck.  Supple nontender no JVD without thyromegaly Cardiac regular rate rhythm without any extra sounds or murmur heard Respiratory.  Effort normal without respiratory distress or wheeze GI.  Soft nontender positive bowel sounds without rebound Musculoskeletal. General.  No deformity or edema Neurological.  Alert and oriented tangential and says he feels that he is 15 feet off the ground.  Decreased insight and attention speech dysarthric right central 7.  Tongue midline.  Right upper extremity 0 out of 5 right lower extremity limited by pain perhaps 1-2 out of 5 proximal with hip flexors knee extension and trace to absent distally.  Senses pain in right arm and leg.  Right upper extremity 2 out of 5 packs biceps 3 out of 5 wrist and finger flexion right lower extremity question 1 hip flexors knee extension left upper extremity left lower extremity 4 to 4+ out of 5.  DTRs 3+. Skin.  Warm and dry hip incision dressed with hydrocolloid dressing  He/  has had improvement in activity tolerance, balance, postural control as well as ability  to compensate for deficits. He/ has had improvement in functional use RUE/LUE  and RLE/LLE as well as improvement in awareness.  Perform blocked practice squat pivot transfers to and from mat from  left and right sides.  Perform minimal assist to right with moderate cues for sequencing and moderate assist to left.  Participated in STS x2 with [facility] with wall rail.  Patient required moderate assist initially when standing due to notable posterior lean which required moderate cues for correction.  Ambulated 25 feet with wall rail moderate assist.  Can ambulate up to 45 feet with SBQC.  Worked on completion of toileting sit to stand with moderate assist.  He then completed stand pivot transfers to the right using grab bar for support with moderate assist.  Increased flexor tone noted in left lower extremity.  He was able to complete squat pivot transfers to the right to the therapy mat with minimum assist.  Plan was for patient to be discharged 02/11/2020 2 skilled nursing facility initially quarantine for 2 weeks and then transition back to assisted living facility.       Disposition: Discharged to [facility]    Diet: Regular  Special Instructions: No driving smoking or alcohol  Weightbearing as tolerated  Medications at discharge 1.  Tylenol as needed 2.  Aspirin 325 mg p.o. daily 3.  Klonopin 0.25 mg p.o. nightly 4.  Prozac 40 mg p.o. daily 5.  Magnesium oxide 400 mg p.o. daily 6.  MiraLAX  daily hold for loose stools 7.  Flomax 0.4 mg p.o. daily 8.  Zanaflex 2 mg p.o. 3 times daily 9.  Ultracet 37.5-325 mg 1 tablet p.o. every 4 hours as needed pain 10.Lipitor 40 mg daily 11.Colace 100 mg BID   Contact information for follow-up providers    Kirsteins, Luanna Salk, MD Follow up.   Specialty: Physical Medicine and Rehabilitation Why: no follow up needed Contact information: [place] [address] [phone]        Leandrew Koyanagi, MD Follow up.   Specialty: Orthopedic Surgery Why: Call for appointment Contact information: [place] [id number] [phone]            Contact information for  after-discharge care    Destination    [facility] Preferred SNF .   Service: Skilled Nursing Contact information: [place] [phone]                  Signed: Cathlyn Parsons 02/11/2020, 5:29 AM

## 2020-02-09 NOTE — Progress Notes (Signed)
Occupational Therapy Session Note  Patient Details  Name: Joshua Shannon MRN: [id number] Date of Birth: 07/30/1941  Today's Date: 02/09/2020 OT Individual Time: 1300-1400 OT Individual Time Calculation (min): 60 min    Short Term Goals: Week 2:  OT Short Term Goal 1 (Week 2): Pt will complete toilet transfer stand pivot with quadcane and mod assist. OT Short Term Goal 1 - Progress (Week 2): Met OT Short Term Goal 2 (Week 2): Pt will complete walk-in shower transfers with mod assist using the Garber for support. OT Short Term Goal 2 - Progress (Week 2): Met OT Short Term Goal 3 (Week 2): Pt will complete LB bathing sit to stand with min assist. OT Short Term Goal 3 - Progress (Week 2): Not met OT Short Term Goal 4 (Week 2): Pt will complete LB dressing sit to stand with min assist including AFO and shoes. OT Short Term Goal 4 - Progress (Week 2): Not met Week 3:  OT Short Term Goal 1 (Week 3): Pt will complete LB bathing sit to stand with min assist. OT Short Term Goal 2 (Week 3): Pt will complete LB dressing sit to stand with min assist including AFO and shoes. OT Short Term Goal 3 (Week 3): Pt will complete squat pivot transfers to the 3:1 with min assist. OT Short Term Goal 4 (Week 3): Pt will complete LB clothing management with min assist sit to stand during toileting tasks.  Skilled Therapeutic Interventions/Progress Updates:  Self-care retraining: OT met patient seated in WC in agreement with OT treatment session. Patient required Min/Mod A for sit to stand from [facility] with R knee block and verbal cues for hand placement. Oral hygiene completed standing at sink level with Min A and tactile cues for R hip/knee extension. Patient completed stand pivot transfer to toilet with BSC over top and use of L grab bar with Mod A for facilitation of R hip and knee extension. Pt able to complete toileting in standing with min A for balance.  Neuromuscular re-education: Patient completed squat pivot  transfer to mat table in the therapy gym with Min A and R knee block. Therapist provided PROM to R wrist/digits to promote weightbearing on flat hand paddle. Patient able to complete 8-10 partial sit to squats while weightbearing on flat surface. Patient able to perform EOB to supine with CGA. Therapist facilitated gravity minimized AAROM in all planes to promote weightbearing, facilitate elongation of soft tissue, and reduce the risk of further contractures and promote normal patterns of movement with hand on flat hand paddle.    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

## 2020-02-09 NOTE — Progress Notes (Signed)
Orthopedic Tech Progress Note Patient Details:  Joshua Shannon 19-Sep-1941 [id number]  Ortho Devices Type of Ortho Device: Knee Immobilizer Ortho Device/Splint Location: right Ortho Device/Splint Interventions: Application   Post Interventions Patient Tolerated: Well Instructions Provided: Care of device   [name] 02/09/2020, 10:19 AM

## 2020-02-09 NOTE — Progress Notes (Signed)
Team Conference Report to Patient/Family  Team Conference discussion was reviewed with the patient and caregiver/wife, including goals for Minimal assistance - Moderate assistance , working on weight shifting to the right side, Moderate assist for standing pivot but minimal assist for squat pivot transfers, any changes in plan of care and target discharge date.  Patient and caregiver express understanding and are in agreement.  The patient has a target discharge date of 02/11/20(Plans to DC to [facility] initially, bed open 2/19.). Wife understands the quarantine for 2 weeks in SNF and then transition to ALF if ready functionally and if not, he will stay in the SNF portion of [facility]. Reviewed botox injections and MD to follow up with her on this and follow up visit with PM+R MD.   Pamelia Hoit 02/09/2020, 3:49 PM

## 2020-02-09 NOTE — Progress Notes (Signed)
Occupational Therapy Session Note  Patient Details  Name: Joshua Shannon MRN: [id number] Date of Birth: Feb 05, 1941  Today's Date: 02/09/2020 OT Individual Time: 1696-7893 OT Individual Time Calculation (min): 73 min    Short Term Goals: Week 3:  OT Short Term Goal 1 (Week 3): Pt will complete LB bathing sit to stand with min assist. OT Short Term Goal 2 (Week 3): Pt will complete LB dressing sit to stand with min assist including AFO and shoes. OT Short Term Goal 3 (Week 3): Pt will complete squat pivot transfers to the 3:1 with min assist. OT Short Term Goal 4 (Week 3): Pt will complete LB clothing management with min assist sit to stand during toileting tasks.  Skilled Therapeutic Interventions/Progress Updates:    Pt on toilet to start session with NT assist.  Worked on completion of toileting sit to stand with mod assist.  He then completed stand pivot transfer to the right using the grab bar for support with mod assist.  Increased flexor tone noted in the LLE with foot pulling off of the ground to begin, but then he was able to get it placed back for weightbearing after 30-45 seconds, but still with some knee flexion in the RUE.  He then transitioned out to the sink where he completed grooming tasks of brushing his hair and brushing his teeth with supervision.  Therapist took pt down to the dayroom for the next part of the session.  He was able to complete squat pivot transfer to the right to the therapy mat with min assist.  Therapist had pt transition to supine and left sidelying with min assist.  Began with scapular mobilizations to the right shoulder with transition to supine.  Next, worked on stretching of the lats and pectoral for shoulder flexion and some external rotation.  He was able to demonstrate some active movement with shoulder flexion/extension and external rotation, but still exhibits significant tone.  Transitioned back to sitting with therapist also working on digit extension  throughout all shoulder movements in supine as well as with sitting.  Therapist applied NMES to the right digit extensors as well as applying the resting hand splint.  Pt transferred back to the wheelchair with min assist squat pivot to the left and returned to the room.  Pt tolerated 1 hr of estim without any adverse reactions.  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 Restrictions Weight Bearing Restrictions: No RLE Weight Bearing: Weight bearing as tolerated Other Position/Activity Restrictions: WBAT RLE  Pain: Pain Assessment Pain Scale: 0-10 Pain Score: 4  Faces Pain Scale: Hurts a little bit Pain Type: Surgical pain Pain Location: Knee Pain Orientation: Right Pain Descriptors / Indicators: Discomfort Pain Frequency: Constant Pain Onset: With Activity Patients Stated Pain Goal: 2 Pain Intervention(s): Medication (See eMAR) ADL: See Care Tool Section for some details of mobility and selfcare  Therapy/Group: Individual Therapy  Joshua Shannon OTR/L 02/09/2020, 10:36 AM

## 2020-02-09 NOTE — Progress Notes (Addendum)
[facility] PHYSICAL MEDICINE & REHABILITATION PROGRESS NOTE   Subjective/Complaints: No issues overnight night except spasms, discussed with OT.  Trial knee immobilizer ROS-  no abd pain  Or N/V.  No CP or SOB  Objective:   No results found. No results for input(s): WBC, HGB, HCT, PLT in the last 72 hours. Recent Labs    02/07/20 0721  CREATININE 0.83    Intake/Output Summary (Last 24 hours) at 02/09/2020 0751 Last data filed at 02/09/2020 0523 Gross per 24 hour  Intake 616 ml  Output 600 ml  Net 16 ml     Physical Exam: Vital Signs Blood pressure (!) 142/69, pulse 74, temperature 97.6 F (36.4 C), resp. rate 15, height '5\' 7"'$  (1.702 m), weight 59 kg, SpO2 91 %. General: No acute distress. Sitting up in bedside chair; comfortable except for R hand- rubbing R hand and R leg constantly, NAD  Mood and affect are appropriate Heart: Regular rate and rhythm no rubs murmurs or extra sounds Lungs: Clear to auscultation, breathing unlabored, no rales or wheezes Abdomen: Positive bowel sounds, soft nontender to palpation, nondistended Extremities: No clubbing, cyanosis, or edema Skin: No evidence of breakdown, no evidence of rash Neurologic: Cranial nerves II through XII intact, motor strength is 5/5 in LEFT deltoid, bicep, tricep, grip, hip flexor, knee extensors, ankle dorsiflexor and plantar flexor 0/5 RUE, 0/5 LLE in all muscle groups  Tone- MAS 3 in R elbow flexors, mAS 4 in RIght finger flexors; clawing; difficulty to get into R hand splint MAS 3 in R Hamstrings and quads.  Continues to be tight, especially on right side, patient's  hamstring feels better with stretching Sensory exam normal sensation to light touch and proprioception in bilateral upper and lower extremities Cerebellar exam normal finger to nose to finger as well as heel to shin in bilateral upper and lower extremities Musculoskeletal: Full range of motion in all 4 extremities. No joint swelling     Assessment/Plan: 1. Functional deficits secondary to RIght hip fracture due to fall in a pt with chronic spastic hemi which require 3+ hours per day of interdisciplinary therapy in a comprehensive inpatient rehab setting.  Physiatrist is providing close team supervision and 24 hour management of active medical problems listed below.  Physiatrist and rehab team continue to assess barriers to discharge/monitor patient progress toward functional and medical goals  Care Tool:  Bathing    Body parts bathed by patient: Right arm, Chest, Abdomen, Front perineal area, Right upper leg, Left upper leg, Right lower leg, Left lower leg, Face   Body parts bathed by helper: Buttocks Body parts n/a: Left arm(did not attempt)   Bathing assist Assist Level: Moderate Assistance - Patient 50 - 74%     Upper Body Dressing/Undressing Upper body dressing   What is the patient wearing?: Pull over shirt    Upper body assist Assist Level: Supervision/Verbal cueing    Lower Body Dressing/Undressing Lower body dressing      What is the patient wearing?: Pants, Incontinence brief     Lower body assist Assist for lower body dressing: Moderate Assistance - Patient 50 - 74%     Toileting Toileting    Toileting assist Assist for toileting: Total Assistance - Patient < 25%     Transfers Chair/bed transfer  Transfers assist     Chair/bed transfer assist level: Minimal Assistance - Patient > 75%(squat pivot to L)     Locomotion Ambulation   Ambulation assist   Ambulation activity did not  occur: Safety/medical concerns  Assist level: Minimal Assistance - Patient > 75% Assistive device: Cane-quad Max distance: 40'   Walk 10 feet activity   Assist  Walk 10 feet activity did not occur: Safety/medical concerns  Assist level: Minimal Assistance - Patient > 75% Assistive device: Cane-quad   Walk 50 feet activity   Assist Walk 50 feet with 2 turns activity did not occur:  Safety/medical concerns  Assist level: Moderate Assistance - Patient - 50 - 74% Assistive device: Cane-quad    Walk 150 feet activity   Assist Walk 150 feet activity did not occur: Safety/medical concerns         Walk 10 feet on uneven surface  activity   Assist Walk 10 feet on uneven surfaces activity did not occur: Safety/medical concerns         Wheelchair     Assist Will patient use wheelchair at discharge?: Yes Type of Wheelchair: Manual    Wheelchair assist level: Supervision/Verbal cueing Max wheelchair distance: 121f    Wheelchair 50 feet with 2 turns activity    Assist        Assist Level: Supervision/Verbal cueing   Wheelchair 150 feet activity     Assist      Assist Level: Supervision/Verbal cueing   Blood pressure (!) 142/69, pulse 74, temperature 97.6 F (36.4 C), resp. rate 15, height 5' 7" (1.702 m), weight 59 kg, SpO2 91 %.    Medical Problem List and Plan: 1.Decreased functional mobilitysecondary to right FNF.S/P righthemiarthroplasty anterior approach 01/21/2020. Weightbearing as tolerated. 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 Was at w/c level PTA  -Continue CIR PT, OT Team conference today please see physician documentation under team conference tab, met with team  to discuss problems,progress, and goals. Formulized individual treatment plan based on medical history, underlying problem and comorbidities. .Mar[facility] KitchenELOS 2/19 ALF vs SNF portion of [facility] 2. Antithrombotics: -DVT/anticoagulation:Lovenox. Negative vascular study -antiplatelet therapy: resume ASA 3238mdaily 3. Pain Management:limit to tylenol, ice pack for now given AMS.  -2/15: pain well controlled.  4. Mood:Prozac 40 mf daily, "depression may be residual effect of higher dose tizanidine  -antipsychotic agents: N/A 5. Neuropsych: This patientiscapable of making decisions on hisown  behalf. 6. Skin/Wound Care:Routine skin checks- sutures removed 2/14.   7. Fluids/Electrolytes/Nutrition:Routine in and outs with follow-up chemistries 8. Acute blood loss anemia. Follow-up CBC today  9. 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  .[facility].Still has severe RUE and RLE tone -Patientalsoon baclofen 10 mg in the morning 20 mg nightly. -family to bring in resting WHO and day time splint from ALF -was using w/c predominantly PTA  46m20mID tizanidine QHS ,0.71m47monopin qhs, became sedated on tizanidine to 4 mg 3 times daily  .  10. BPH. Resume Flomax 0.4 mg daily. 11.Hyperlipidemia. Lipitor 12. Constipation.   13. Confusion: Likely medication induced. Hydrocodone dc'ed already -would go ahead and hold robaxin too. -low grade temp, WBC normal --- negative  UA, UCX neg  14. Insomnia:  On klonopin for hamstring spasms, this has helped sleep as well, monitor for sedation- per wife was on Ativan at home  LOS: 16 days A FACE TO FACE EVALUATION WAS PERFORMED  AndrCharlett Blake7/2021, 7:51 AM

## 2020-02-09 NOTE — Consult Note (Signed)
Neuropsychological Consultation   Patient:   Joshua Shannon   DOB:   08/12/1941  MR Number:  [id number]  Location:  [facility] [id number] [place] [address] Dept: [place]: [phone]           Date of Service:   02/09/2020  Start Time:   2 PM End Time:   3 PM  Provider/Observer:  Ilean Skill, Psy.D.       Clinical Neuropsychologist       Billing Code/Service: 42706  Chief Complaint:    Joshua Shannon is a 79 year old male with a history of hyperlipidemia, depression, BPH, CVA with right-sided residual weakness and spastic as well as memory impairments.  The [facility] residing [facility].  Presented on 01/20/2020 after mechanical fall without loss of consciousness.  Patient states that he fell out of his wheelchair and developed significant right hip pain.  X-ray and imaging revealed right femoral neck fracture.  The patient underwent right hip surgery by Dr. Kingsley Callander.  Weightbearing as tolerated right lower extremity.  The patient has a history of prior memory issues that the patient feels were associated with medication issues and periods of significant sleep disturbance.  The patient also reports that times with visual hallucinations that have been associated with medication and sleep disturbance.  The patient denies any long history of visual disturbance and he is well aware that they are hallucinations and has no delusions around these visual disturbances.  Reason for Service:  The patient was referred for neuropsychological consultation due to coping and adjustment issues and assessing the degree of memory difficulties and any impact these episodic visual hallucinations may be having.  Below is the HPI for the current admission.  Joshua Shannon is a 79 year old right-handed male with history of hyperlipidemia, depression, BPH, CVA with right-sided residual  weakness and spasticityas well as memory impairmentsmaintained on aspirin 325 mg daily as well as baclofen.Patient currently resides at [facility]. Presented 01/20/2020 after mechanical fall without loss of consciousness. Patient states he fell out of his wheelchair with right hip pain. X-rays and imaging revealed right femoral neck fracture. Underwent righthip hemiarthroplasty anterior approach 01/21/2020 per Dr.Xu. Weightbearing as tolerated right lower extremity. Maintained on Lovenox for DVT prophylaxis. Acute blood loss anemia 10.7 and monitored. Therapy evaluation completed and patient was admitted for a comprehensive rehab program.  Current Status:  The patient reports that he is done fairly well feels like he is making significant improvements in rehab.  The patient reports that he is becoming more comfortable with weightbearing but does acknowledge that it was stressful for him at first.  The patient describes visual hallucinations that he feels were directly related to some of the medications that he was taken and reports that he has not been having any lately.  The patient reports that his muscle spasms in his leg and pain have been disturbing sleep and he had 2 very poor nights of sleep in a row and yesterday was a difficult day in therapy.  The patient reports that he slept better last night with medication adjustments and today was a much better day in therapies.  The patient denies significant depression at this point but does report that it has been frustrating for him to be away from his wife.  However, the patient reports that his wife is in the process of selling their multilevel house and they  are purchasing a house in [place] and has a single level where they both can stay.  The patient was well oriented today and able to verbalize and explain what had happened to him recently as well as where he was and the therapeutic efforts that have been going on.   The patient reports that while he does have some age-related memory that his memory is actually pretty good as long as he is sleeping well and his medications are managed well.  The patient will did not appear to have any significant cognitive impairments or difficulties today and was able to attend to our conversation and answer questions appropriately and with clear indications of short-term memory effectiveness and clear indications of intermediate long-term memory adequacies.  Executive functioning appeared to be within normal limits.  The patient's expressive speech appeared to be within normal limits for verbal fluency and word finding his speech was very quiet with some trembling and hesitancy to speech.  Behavioral Observation: Joshua Shannon  presents as a 79 y.o.-year-old Right Caucasian Male who appeared his stated age. his dress was Appropriate and he was Well Groomed and his manners were Appropriate to the situation.  his participation was indicative of Appropriate and Redirectable behaviors.  There were any physical disabilities noted.  he displayed an appropriate level of cooperation and motivation.     Interactions:    Active Appropriate and Redirectable  Attention:   abnormal and attention span appeared shorter than expected for age  Memory:   within normal limits; recent and remote memory intact  Visuo-spatial:  not examined  Speech (Volume):  low  Speech:   normal; slowed response time  Thought Process:  Coherent and Relevant  Though Content:  WNL; not suicidal and not homicidal  Orientation:   person, place, time/date and situation  Judgment:   Good  Planning:   Fair  Affect:    Appropriate  Mood:    Dysphoric  Insight:   Good  Intelligence:   high  Medical History:   Past Medical History:  Diagnosis Date  . Depression   . Hemorrhoids   . Hyperlipemia      Psychiatric History:  The patient does have a history of depressive symptomatology that he feels are  directly related to loss of function, medication issues and sustained sleep disturbance at times.  The patient was at his mood is fairly stable right now and denies any significant depressive symptomatology and reports that his mood does not have the level of negative mood state to impact ability to actively engage in therapeutic interventions on the inpatient unit.  Family Med/Psych History: History reviewed. No pertinent family history.  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  The patient has [facility] residing [facility].  Presented on 01/20/2020 after mechanical fall without loss of consciousness.  Patient states that he fell out of his wheelchair and developed significant right hip pain.  X-ray and imaging revealed right femoral neck fracture.  The patient underwent right hip surgery by Dr. Karie Fetch.  Weightbearing as tolerated right lower extremity.  The patient has a history of prior memory issues that the patient feels were associated with medication issues and periods of significant sleep disturbance.  The patient also reports that times with visual hallucinations that have been associated with medication and sleep disturbance.  The patient denies any long history  of visual disturbance and he is well aware that they are hallucinations and has no delusions around these visual disturbances.  The patient reports that he is done fairly well feels like he is making significant improvements in rehab.  The patient reports that he is becoming more comfortable with weightbearing but does acknowledge that it was stressful for him at first.  The patient describes visual hallucinations that he feels were directly related to some of the medications that he was taken and reports that he has not been having any lately.  The patient reports that his muscle spasms  in his leg and pain have been disturbing sleep and he had 2 very poor nights of sleep in a row and yesterday was a difficult day in therapy.  The patient reports that he slept better last night with medication adjustments and today was a much better day in therapies.  The patient denies significant depression at this point but does report that it has been frustrating for him to be away from his wife.  However, the patient reports that his wife is in the process of selling their multilevel house and they are purchasing a house in [place] and has a single level where they both can stay.  The patient was well oriented today and able to verbalize and explain what had happened to him recently as well as where he was and the therapeutic efforts that have been going on.  The patient reports that while he does have some age-related memory that his memory is actually pretty good as long as he is sleeping well and his medications are managed well.  The patient will did not appear to have any significant cognitive impairments or difficulties today and was able to attend to our conversation and answer questions appropriately and with clear indications of short-term memory effectiveness and clear indications of intermediate long-term memory adequacies.  Executive functioning appeared to be within normal limits.  The patient's expressive speech appeared to be within normal limits for verbal fluency and word finding his speech was very quiet with some trembling and hesitancy to speech.  The patient does have a history of depressive symptomatology that he feels are directly related to loss of function, medication issues and sustained sleep disturbance at times.  The patient was at his mood is fairly stable right now and denies any significant depressive symptomatology and reports that his mood does not have the level of negative mood state to impact ability to actively engage in therapeutic interventions on the inpatient  unit.  Disposition/Plan:  Will follow up with the patient next week if needed.  Diagnosis:    Femoral neck fracture/right, history of memory loss and depression associated with poor sleep and medication issues.        Electronically Signed   _______________________ Arley Phenix, Psy.D.

## 2020-02-10 ENCOUNTER — Inpatient Hospital Stay (HOSPITAL_COMMUNITY): Payer: Medicare Other | Admitting: Occupational Therapy

## 2020-02-10 ENCOUNTER — Inpatient Hospital Stay (HOSPITAL_COMMUNITY): Payer: Medicare Other | Admitting: Physical Therapy

## 2020-02-10 LAB — SARS CORONAVIRUS 2 (TAT 6-24 HRS): SARS Coronavirus 2: NEGATIVE

## 2020-02-10 MED ORDER — ASPIRIN 325 MG PO TABS: 325.0000 mg | ORAL_TABLET | Freq: Every day | ORAL | Status: AC

## 2020-02-10 MED ORDER — TIZANIDINE HCL 2 MG PO TABS: 2.0000 mg | ORAL_TABLET | Freq: Three times a day (TID) | ORAL | 0 refills | Status: AC

## 2020-02-10 MED ORDER — TRAMADOL-ACETAMINOPHEN 37.5-325 MG PO TABS: 1.0000 | ORAL_TABLET | ORAL | 0 refills | Status: AC | PRN

## 2020-02-10 MED ORDER — CLONAZEPAM 0.25 MG PO TBDP: 0.2500 mg | ORAL_TABLET | Freq: Every day | ORAL | 0 refills | Status: AC

## 2020-02-10 NOTE — Progress Notes (Signed)
Occupational Therapy Discharge Summary  Patient Details  Name: Joshua Shannon MRN: [id number] Date of Birth: [birth year]  Today's Date: 02/10/2020 OT Individual Time: 0737-1062 OT Individual Time Calculation (min): 66 min   Session Note:  Pt went down to the therapy gym via wheelchair and therapist assist.  He completed squat pivot transfers to and from the therapy mat with min assist.  Mod demonstrational cueing needed for greater trunk activation for reciprocal scooting as well as initial sit to squat during the transfer.  Once on the therapy mat, used foam hand paddle for placement of the right hand to stretch the finger flexors.  Therapist then completed scapular mobilizations and stretching of the shoulder extensors with PROM shoulder flexion to approximately 90 degrees.  Worked on positioning of the hand in weightbearing on the side of the mat while pt was engaged in transitions from right forearm and hand to sitting.  Mod facilitation needed for activation of the right trunk as well as the elbow extensors to assist with return to upright midline posture.  Incorporated some reaching across his body with the LUE to target to increase weightbearing through the RUE as well.  Had pt transition to supine with min assist for work on activation of the abdominals to assist with rolling to each side.  Min assist needed for rolling to the right with RUE placed in approximately 70 degrees of abduction and scapula stabilized while rolling.  He was educated on self assist technique, using the LUE to hold the RUE at the wrist and help to bring it across his body in conjunction with cervical flexion for activation of the abdominals to roll.  Finished session with transitions back to sitting from supine with mod assist and return to the wheelchair at min assist level squat pivot.  Resting hand splint re-applied to the right hand for positioning of the digits.  Pt taken back to the room with his daughter present and NT  notified of pt's request to toilet.    Patient has met 11 of 13 long term goals due to improved activity tolerance, improved balance and ability to compensate for deficits.  Patient to discharge at [facility] Assist level.  Patient's care partner requires assistance to provide the necessary physical assistance at discharge.    Reasons goals not met: Pt continues to need mod assist for toileting as well as LB bathing sit to stand secondary to increased uncontrollable RLE tone.   Recommendation:  Patient will benefit from ongoing skilled OT services in skilled nursing facility setting to continue to advance functional skills in the area of BADL and Reduce care partner burden.  Feel pt will benefit from ongoing daily OT sessions to work on improving normal functional movement patterns with increased use of the RUE and RLE.  Also recommend intense OT after completion of the botox injections to the right arm as pt demonstrates some active movement but is limited by the severe tone.    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 Restrictions Weight Bearing Restrictions: No  Pain Pain Assessment Pain Scale: Faces Faces Pain Scale: Hurts a little bit Pain Type: Acute pain Pain Location: Hip Pain Orientation: Right Pain Descriptors / Indicators: Discomfort Pain Onset: With Activity Pain Intervention(s): Repositioned ADL ADL Eating: Set  up Where Assessed-Eating: Wheelchair Grooming: Supervision/safety Where Assessed-Grooming: Wheelchair, Sitting at sink Upper Body Bathing: Supervision/safety Where Assessed-Upper Body Bathing: Shower Lower Body Bathing: Moderate assistance Where Assessed-Lower Body Bathing:  Shower Upper Body Dressing: Supervision/safety Where Assessed-Upper Body Dressing: Wheelchair, Sitting at sink Lower Body Dressing: Moderate assistance Where Assessed-Lower Body Dressing: Wheelchair, Sitting at sink, Standing at sink Toileting: Moderate assistance Where Assessed-Toileting: Bedside Commode Toilet Transfer: Minimal assistance Toilet Transfer Method: Squat pivot Toilet Transfer Equipment: Geophysical data processor: Moderate assistance Social research officer, government Method: Radiographer, therapeutic: Shower seat with back Vision Baseline Vision/History: Wears glasses Wears Glasses: At all times Patient Visual Report: No change from baseline Vision Assessment?: No apparent visual deficits Perception  Perception: Within Functional Limits Praxis Praxis: Intact Cognition Overall Cognitive Status: History of cognitive impairments - at baseline Arousal/Alertness: Awake/alert Orientation Level: Oriented X4 Attention: Focused;Sustained Focused Attention: Appears intact Sustained Attention: Appears intact Selective Attention: Appears intact Memory: Appears intact Awareness: Appears intact Problem Solving: Appears intact Safety/Judgment: Appears intact Sensation Sensation Light Touch: Impaired Detail Light Touch Impaired Details: Impaired RUE Hot/Cold: Not tested Stereognosis: Not tested Stereognosis Impaired Details: Impaired RUE Additional Comments: Decreased light touch in the right arm and hand compared the left. Coordination Gross Motor Movements are Fluid and Coordinated: No Fine Motor Movements are Fluid and Coordinated: No Coordination and Movement Description: Pt with increased tone in the RUE at the shoulder, elbow, and hand.  He does not integrate functionally into selfcare tasks at this time and relies on compensation techniques.  Max hand over hand if attempting use during selfcare tasks. Motor  Motor Motor: Hemiplegia;Abnormal  tone;Abnormal postural alignment and control Motor - Discharge Observations: RUE and RLE hemiparesis from previous CVA Mobility  Bed Mobility Bed Mobility: Supine to Sit;Sit to Supine Supine to Sit: Minimal Assistance - Patient > 75% Sit to Supine: Minimal Assistance - Patient > 75% Transfers Sit to Stand: Minimal Assistance - Patient > 75% Stand to Sit: Minimal Assistance - Patient > 75%  Trunk/Postural Assessment  Cervical Assessment Cervical Assessment: Exceptions to WFL(slight cervical protraction) Thoracic Assessment Thoracic Assessment: Exceptions to WFL(slight thoracic rounding with right trunk passive elongation compared to the left side) Lumbar Assessment Lumbar Assessment: Exceptions to WFL(posterior pelvic tilt in sitting)  Balance Balance Balance Assessed: Yes Static Sitting Balance Static Sitting - Balance Support: Feet supported Static Sitting - Level of Assistance: 6: Modified independent (Device/Increase time) Dynamic Sitting Balance Dynamic Sitting - Balance Support: During functional activity Dynamic Sitting - Level of Assistance: 4: Min assist Static Standing Balance Static Standing - Balance Support: During functional activity;Left upper extremity supported Static Standing - Level of Assistance: 4: Min assist Dynamic Standing Balance Dynamic Standing - Balance Support: During functional activity;Left upper extremity supported Dynamic Standing - Level of Assistance: 3: Mod assist Extremity/Trunk Assessment RUE Assessment RUE Assessment: Exceptions to [facility] Passive Range of Motion (PROM) Comments: Shoulder flexion 0-100 degrees, elbow flexion/extension impaired with increased tone throughout movement Active Range of Motion (AROM) Comments: AAROM shoulder flexion, shoulder extension, elbow extension and elbow flexion but limited by increased tone in the joints for over 50% of the movement General Strength Comments: Pt with moderate increased tone in the internal  rotators of the shoulder as well as the shoulder extensors.  Increased biceps and pectoral tone is present as well as severe digit flexor tone. Pt continues to tolerate PROM stretching at all joints with slight limitations to around 90 degrees for shoulder abduction with external rotation.  He also continues  to tolerate resting hand splint most of the day and night.  Max hand over hand for integration into any functional task such as bathing in order to use as a stablizer of the washcloth RUE Body System: Neuro Brunstrum levels for arm and hand: Arm;Hand Brunstrum level for arm: Stage II Synergy is developing Brunstrum level for hand: Stage II Synergy is developing RUE Tone RUE Tone: Severe;Moderate LUE Assessment LUE Assessment: Within Functional Limits   Habiba Treloar OTR/L 02/10/2020, 4:58 PM

## 2020-02-10 NOTE — Progress Notes (Addendum)
[place] PHYSICAL MEDICINE & REHABILITATION PROGRESS NOTE   Subjective/Complaints: Slept well, good appetite this am, appreciate neuropsych note  ROS-  no abd pain  Or N/V.  No CP or SOB  Objective:   No results found. Recent Labs    02/09/20 0957  WBC 5.0  HGB 11.4*  HCT 35.6*  PLT 462*   No results for input(s): NA, K, CL, CO2, GLUCOSE, BUN, CREATININE, CALCIUM in the last 72 hours.  Intake/Output Summary (Last 24 hours) at 02/10/2020 0828 Last data filed at 02/09/2020 1853 Gross per 24 hour  Intake 573 ml  Output --  Net 573 ml     Physical Exam: Vital Signs Blood pressure 126/61, pulse 76, temperature 98.4 F (36.9 C), temperature source Oral, resp. rate 17, height 5\' 7"  (1.702 m), weight 59 kg, SpO2 96 %. General: No acute distress. Sitting up in bedside chair; comfortable except for R hand- rubbing R hand and R leg constantly, NAD  Mood and affect are appropriate Heart: Regular rate and rhythm no rubs murmurs or extra sounds Lungs: Clear to auscultation, breathing unlabored, no rales or wheezes Abdomen: Positive bowel sounds, soft nontender to palpation, nondistended Extremities: No clubbing, cyanosis, or edema Skin: No evidence of breakdown, no evidence of rash Neurologic: Cranial nerves II through XII intact, motor strength is 5/5 in LEFT deltoid, bicep, tricep, grip, hip flexor, knee extensors, ankle dorsiflexor and plantar flexor 0/5 RUE, 0/5 LLE in all muscle groups  Tone- MAS 3 in R elbow flexors, mAS 4 in RIght finger flexors; clawing; difficulty to get into R hand splint MAS 3 in R Hamstrings and quads.  Continues to be tight, especially on right side, No pain with ROMSensory exam normal sensation to light touch and proprioception in bilateral upper and lower extremities Cerebellar exam normal finger to nose to finger as well as heel to shin in bilateral upper and lower extremities Musculoskeletal: Full range of motion in all 4 extremities. No joint swelling     Assessment/Plan: 1. Functional deficits secondary to RIght hip fracture due to fall in a pt with chronic spastic hemi which require 3+ hours per day of interdisciplinary therapy in a comprehensive inpatient rehab setting.  Physiatrist is providing close team supervision and 24 hour management of active medical problems listed below.  Physiatrist and rehab team continue to assess barriers to discharge/monitor patient progress toward functional and medical goals  Care Tool:  Bathing    Body parts bathed by patient: Right arm, Chest, Abdomen, Front perineal area, Right upper leg, Left upper leg, Right lower leg, Left lower leg, Face   Body parts bathed by helper: Buttocks Body parts n/a: Left arm(did not attempt)   Bathing assist Assist Level: Moderate Assistance - Patient 50 - 74%     Upper Body Dressing/Undressing Upper body dressing   What is the patient wearing?: Pull over shirt    Upper body assist Assist Level: Supervision/Verbal cueing    Lower Body Dressing/Undressing Lower body dressing      What is the patient wearing?: Pants, Incontinence brief     Lower body assist Assist for lower body dressing: Moderate Assistance - Patient 50 - 74%     Toileting Toileting    Toileting assist Assist for toileting: Minimal Assistance - Patient > 75%     Transfers Chair/bed transfer  Transfers assist     Chair/bed transfer assist level: Contact Guard/Touching assist     Locomotion Ambulation   Ambulation assist   Ambulation activity did  not occur: Safety/medical concerns  Assist level: Minimal Assistance - Patient > 75% Assistive device: Cane-quad Max distance: 40'   Walk 10 feet activity   Assist  Walk 10 feet activity did not occur: Safety/medical concerns  Assist level: Minimal Assistance - Patient > 75% Assistive device: Cane-quad   Walk 50 feet activity   Assist Walk 50 feet with 2 turns activity did not occur: Safety/medical  concerns  Assist level: Moderate Assistance - Patient - 50 - 74% Assistive device: Cane-quad    Walk 150 feet activity   Assist Walk 150 feet activity did not occur: Safety/medical concerns         Walk 10 feet on uneven surface  activity   Assist Walk 10 feet on uneven surfaces activity did not occur: Safety/medical concerns         Wheelchair     Assist Will patient use wheelchair at discharge?: Yes Type of Wheelchair: Manual    Wheelchair assist level: Supervision/Verbal cueing Max wheelchair distance: 139ft    Wheelchair 50 feet with 2 turns activity    Assist        Assist Level: Supervision/Verbal cueing   Wheelchair 150 feet activity     Assist      Assist Level: Supervision/Verbal cueing   Blood pressure 126/61, pulse 76, temperature 98.4 F (36.9 C), temperature source Oral, resp. rate 17, height 5\' 7"  (1.702 m), weight 59 kg, SpO2 96 %.    Medical Problem List and Plan: 1.Decreased functional mobilitysecondary to right FNF.S/P righthemiarthroplasty anterior approach 01/21/2020. Weightbearing as tolerated. 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 Was at w/c level PTA  -Continue CIR PT, OT Plan d/c to C:[facility] 2/19  2. Antithrombotics: -DVT/anticoagulation:Lovenox. Negative vascular study -antiplatelet therapy: resume ASA 325mg  daily 3. Pain Management:limit to tylenol, ice pack for now given AMS.  -2/15: pain well controlled.  4. Mood:Prozac 40 mf daily, "depression may be residual effect of higher dose tizanidine  -antipsychotic agents: N/A 5. Neuropsych: This patientiscapable of making decisions on hisown behalf. 6. Skin/Wound Care:Routine skin checks- sutures removed 2/14.   7. Fluids/Electrolytes/Nutrition:Routine in and outs with follow-up chemistries 8. Acute blood loss anemia. Follow-up CBC Hgb 11.4 improving  9. CVA with right-sided  residual weakness and spasticity as well as memory impairments.  .Pt received botox injections by Dr. Dimas Millin [facility] on 11/30/2019.Still has severe RUE and RLE tone- may need to try Dysport given dosage needs -Patientalsoon baclofen 10 mg in the morning 20 mg nightly. -family to bring in resting WHO and day time splint from ALF -was using w/c predominantly PTA  2mg  QID tizanidine QHS ,0.25mg  Klonopin qhs, became sedated on tizanidine to 4 mg 3 times daily  .  10. BPH. Resume Flomax 0.4 mg daily. 11.Hyperlipidemia. Lipitor 12. Constipation.   13. Confusion: Likely medication induced. Hydrocodone dc'ed already -would go ahead and hold robaxin too. -low grade temp, WBC normal --- negative  UA, UCX neg  14. Insomnia:  On klonopin for hamstring spasms, this has helped sleep as well, monitor for sedation- per wife was on Ativan at home  LOS: 17 days A FACE TO [place] E Aryssa Rosamond 02/10/2020, 8:28 AM

## 2020-02-10 NOTE — Progress Notes (Signed)
Physical Therapy Discharge Summary  Patient Details  Name: Joshua Shannon MRN: [id number] Date of Birth: 05-04-1941  Today's Date: 02/10/2020 PT Individual Time: 0905-1005 PT Individual Time Calculation (min): 60 min    Patient has met 5 of 8 long term goals due to improved activity tolerance, improved balance, improved postural control, increased strength, increased range of motion, decreased pain, ability to compensate for deficits, functional use of  right lower extremity, improved attention, improved awareness and improved coordination.  Patient to discharge at a wheelchair level requiring supervision for w/c mobility and Min Assist for transfers. Patient's care partner requires assistance to provide the necessary physical and cognitive assistance at discharge therefore patient is planning to return to ALF after 14 day quarantine at [facility].   Reasons goals not met: Car transfer goal not applicable as pt planning on ambulance transport. Patient continues to require mod assist for standing balance and intermittently during ambulation due to posterior lean and poor balance recovery strategies.  Recommendation:  Patient will benefit from ongoing skilled PT services in skilled nursing facility setting to continue to advance safe functional mobility, address ongoing impairments in R hemiparesis, standing balance, transfers, gait training, R LE tone management and minimize fall risk. Feel patient will benefit from ongoing daily PT sessions for increased independence with functional mobility.   Equipment: No equipment provided  Reasons for discharge: treatment goals met and discharge from hospital  Patient/family agrees with progress made and goals achieved: Yes   Skilled Therapeutic Interventions/Progress Updates:  Patient received sitting in recliner reporting he had an accident in the bed last night and soaked the sheets - despite this pt reports he got "plenty" of sleep last night although later  in session reports feeling "exhausted" (pt appears tired throughout session). Pt agreeable to therapy session. Pt reporting not wearing R knee immobilizer last night, no reason given. Sitting in recliner therapist donned B TEDs, socks, shoes, R AFO, and pants max assist for time management. Seated scooting towards edge of chair relying heavily on arm rest and pt continuing to demonstrate poor R trunk/pelvis control to move R hip forward - able to complete without assist but significantly increased time and difficulty. Sit>stand recliner>L UE support on window seal with min assist for balance due to posterior lean and min assist for balance while pulling pants over hips via mod assist for time management - cuing throughout for balance correction with anterior trunk lean. R squat/stand pivot recliner>w/c relying heavily on arm rests with min assist for balance. Performed L hemi-technique w/c propulsion ~146f out of room and to therapy gym with supervision. Performed seated R LE long arc quads x15 reps with therapist providing over pressure into more extension on final few reps for tone management - therapist educated pt on importance of performing this prior to all standing tasks. Seated scoot towards front of w/c with increased time and pt relying heavily on L UE to perform task. Sit>stand w/c>QC with min assist for balance coming to standing due to posterior lean, max cuing for correction. Gait training ~69fusing QC (including a turn) with primarily min assist for balance and intermittent mod assist due to posterior lean - max multimodal cuing for correction and sequencing of steps throughout - therapist only assisted with R LE swing phase a few times otherwise pt able to bring foot through but he does demonstrate significant increase in R LE flexor and adductor tone resulting in very short step lengths and very narrow BOS - 1x pt's R  LE flexor tone caused his leg to draw up off the ground requiring requiring  manual facilitation to reposition it back onto the ground - this gait trial took 79mnutes to complete. Transported back to room for time management. R squat/stand pivot transfer w/c>EOB relying heavily on armrests and bedrails with min assist for balance. Sit<>supine in bed using bedrails as needed with supervision and increased time. Pt requested to return to supine and rest due to fatigue. Pt requests to trial R knee immobilizer brace for tone management since not worn last night and planning to take a nap - therapist donned. Pt left supine in bed with needs in reach and bed alarm on.  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 Required Braces or Orthoses: Knee Immobilizer - Right Knee Immobilizer - Right: Other (comment)(at night for tone management) Restrictions Weight Bearing Restrictions: No RLE Weight Bearing: Weight bearing as tolerated Other Position/Activity Restrictions: WBAT RLE Pain Pain Assessment Pain Scale: 0-10 Pain Score: 0-No pain Perception  Perception Perception: Within Functional Limits Praxis Praxis: Intact  Cognition Overall Cognitive Status: History of cognitive impairments - at baseline Arousal/Alertness: Lethargic  Orientation Level: Oriented X4 Attention: Focused;Sustained Focused Attention: Appears intact Sustained Attention: Appears intact Safety/Judgment: Appears intact Sensation Sensation Light Touch: Impaired Detail Light Touch Impaired Details: Impaired RLE Hot/Cold: Not tested Proprioception: Impaired by gross assessment Stereognosis: Not tested Coordination Gross Motor Movements are Fluid and Coordinated: No Fine Motor Movements are Fluid and Coordinated: No Coordination and Movement Description: Increased R UE and LE tone and residual R hemiparesis from prior CVA - resulting in impaired motor movements/coordination Heel Shin Test:  unable to perform with R LE due to tone Motor  Motor Motor: Hemiplegia;Abnormal tone;Abnormal postural alignment and control Motor - Discharge Observations: RUE and RLE hemiparesis from previous CVA with significant flexor tone in both UE and LE  Mobility Bed Mobility Bed Mobility: Supine to Sit;Sit to Supine Supine to Sit: Supervision/Verbal cueing Sit to Supine: Supervision/Verbal cueing Transfers Transfers: Sit to Stand;Stand to Sit;Squat Pivot Transfers Sit to Stand: Minimal Assistance - Patient > 75% Stand to Sit: Minimal Assistance - Patient > 75% Squat Pivot Transfers: Minimal Assistance - Patient > 75% Transfer (Assistive device): Small based quad cane Locomotion  Gait Ambulation: Yes Gait Assistance: Minimal Assistance - Patient > 75%;Moderate Assistance - Patient 50-74% Gait Distance (Feet): 62 Feet Assistive device: Small based quad cane Gait Assistance Details: Tactile cues for sequencing;Tactile cues for initiation;Tactile cues for weight shifting;Tactile cues for posture;Manual facilitation for weight shifting;Manual facilitation for weight bearing;Verbal cues for safe use of DME/AE;Verbal cues for sequencing;Visual cues/gestures for sequencing;Visual cues for safe use of DME/AE;Visual cues/gestures for precautions/safety Gait Gait: Yes Gait Pattern: Impaired Gait Pattern: Decreased stance time - right;Decreased step length - right;Decreased step length - left;Decreased stride length;Right flexed knee in stance;Lateral trunk lean to left;Decreased trunk rotation;Narrow base of support(significant R flexor tone) Gait velocity: significantly decreased Stairs / Additional Locomotion Stairs: No Wheelchair Mobility Wheelchair Mobility: Yes Wheelchair Assistance: Supervision/Verbal cueing Wheelchair Propulsion: Left upper extremity;Left lower extremity Wheelchair Parts Management: Needs assistance Distance: 157f Trunk/Postural Assessment  Cervical Assessment Cervical  Assessment: Exceptions to WFL(cervical protraction) Thoracic Assessment Thoracic Assessment: Exceptions to WFL(thoracic kyphosis with rounded shoulders) Lumbar Assessment Lumbar Assessment: Exceptions to WFL(posterior pelvic tilt in sitting) Postural Control Postural Control: Deficits on evaluation Righting Reactions: delayed and inadequate with slow initiation and implementation of balance strategies - requires assist to prevent LOB  Postural Limitations: demonstrates posterior lean in standing with L latearl weight shift bias  Balance Balance Balance Assessed: Yes Static Sitting Balance Static Sitting - Balance Support: Feet supported Static Sitting - Level of Assistance: 5: Stand by assistance Dynamic Sitting Balance Dynamic Sitting - Balance Support: During functional activity Dynamic Sitting - Level of Assistance: 5: Stand by assistance;4: Min assist Static Standing Balance Static Standing - Balance Support: During functional activity;Left upper extremity supported Static Standing - Level of Assistance: 4: Min assist Dynamic Standing Balance Dynamic Standing - Balance Support: During functional activity;Left upper extremity supported Dynamic Standing - Level of Assistance: 4: Min assist;3: Mod assist Extremity Assessment      RLE Assessment RLE Assessment: Exceptions to [facility] Passive Range of Motion (PROM) Comments: hip flexion to 90*, limited full knee extension secondary to HS tightness, DF to neutral Active Range of Motion (AROM) Comments: limited d/t pain, weakness, and tone; lacking DF/PF may be secondary to hx of CVA (2020) and R hemiparesis RLE Strength Right Hip Flexion: 3+/5(difficult to assess due to tone) Right Hip Extension: 3-/5(impaired strength with difficulty overcoming tone) Right Knee Flexion: 3+/5(difficult to assess due to tone) Right Knee Extension: 3-/5(limited ROM 2/2 tone) Right Ankle Dorsiflexion: 2+/5 Right Ankle Plantar Flexion: 2-/5 LLE  Assessment LLE Assessment: Within Functional Limits Active Range of Motion (AROM) Comments: WFL General Strength Comments: grossly 5/5    Tawana Scale, PT, DPT 02/10/2020, 7:52 AM

## 2020-02-10 NOTE — Progress Notes (Signed)
Occupational Therapy Session Note  Patient Details  Name: Joshua Shannon MRN: [id number] Date of Birth: 1941/08/01  Today's Date: 02/10/2020 OT Individual Time: 1105-1202 OT Individual Time Calculation (min): 57 min    Short Term Goals: Week 3:  OT Short Term Goal 1 (Week 3): Pt will complete LB bathing sit to stand with min assist. OT Short Term Goal 2 (Week 3): Pt will complete LB dressing sit to stand with min assist including AFO and shoes. OT Short Term Goal 3 (Week 3): Pt will complete squat pivot transfers to the 3:1 with min assist. OT Short Term Goal 4 (Week 3): Pt will complete LB clothing management with min assist sit to stand during toileting tasks.  Skilled Therapeutic Interventions/Progress Updates:    Pt in bed to start session, writhing in pain in his right groin/inner thigh area.  KI was noted to be in place, so it was removed and pt transitioned to sitting after a couple of mins with overall min assist.  He reported pain relief with removal of the KI and from the EOB worked on donning his shoes and right AFO.  He was able to donn the left with setup and use of shoe buttons for tying.  He donned the right but needed mod assist to get the heel into the shoe secondary to AFO.  Next, he performed squat pivot transfer to the wheelchair with min assist and mod instructional cueing for posture and forward trunk flexion.  Pt was rolled to the therapy gym where he transferred to the therapy mat with min assist and then to supine.  Therapist placed pillows to support the RLE and had pt roll onto his left side where he then completed scapular mobilizations of downward rotation, adduction and depression.  Pt was able to complete some AAROM for adduction and depression as well.  Had him transition to supine where stretching to the lat was performed with PROM shoulder flexion.  Pt then assisted with some AAROM shoulder flexion with max assist as well as elbow extension.  He is able to elicit  activation in all of the movements, but is limited greatly by the increased tone in the biceps and digit flexors.  Therapist worked on stretching of the digit flexors and then applied NMES to the dorsal forearm to help activate the digit extensors.  Right resting hand splint was placed as well and pt transferred back to the wheelchair with min assist squat pivot.  He was returned to the room where stimulation was maintained for 60 mins without any averse reactions at level 20.  See below for details. Pt left in the room with call button and phone in reach and safety belt in place.   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 Required Braces or Orthoses: Knee Immobilizer - Right Knee Immobilizer - Right: Other (comment)(trying with tone management in the RLE) Restrictions Weight Bearing Restrictions: No RLE Weight Bearing: Weight bearing as tolerated Other Position/Activity Restrictions: WBAT RLE   Pain: Pain Assessment Pain Scale: Faces Pain Score: 5  Faces Pain Scale: Hurts even more Pain Type: Acute pain Pain Location: Hip Pain Orientation: Right Pain Descriptors / Indicators: Crushing;Grimacing Pain Frequency: Intermittent Pain Onset: Other (Comment)(with KI  in place on the right LE) Patients Stated Pain Goal: 4 Pain Intervention(s): Repositioned;Cold applied;Emotional support ADL: See Care Tool Section for some details of selfcare and mobility  Therapy/Group: Individual Therapy  Regis Wiland OTR/L 02/10/2020, 12:41 PM

## 2020-02-10 NOTE — Care Management (Signed)
   The overall goal for the admission was met for:   Discharge location: [place]  Length of Stay: 18 days with discharge 02/11/20  Discharge activity level: Minimal-Moderate assistance overall  Home/community participation: Information systems manager provided included: MD, RD, PT, OT, SLP, RN, CM, Pharmacy, Neuropsych and SW  Financial Services: Medicare and Private Insurance: [employer] PPO  Follow-up services arranged: Other: [facility]  Comments (or additional information): [facility] [phone]  Patient/Family verbalized understanding of follow-up arrangements: Yes  Individual responsible for coordination of the follow-up plan: Wife:Phyllis Groene [phone]  Transportation to [facility] via [place] arranged.  Margarito Liner

## 2020-02-11 NOTE — Progress Notes (Addendum)
[facility] PHYSICAL MEDICINE & REHABILITATION PROGRESS NOTE   Subjective/Complaints: Patient had a good night last night.  We discussed discharge and need to follow-up with neurology ROS-  no abd pain  Or N/V.  No CP or SOB  Objective:   No results found. Recent Labs    02/09/20 0957  WBC 5.0  HGB 11.4*  HCT 35.6*  PLT 462*   No results for input(s): NA, K, CL, CO2, GLUCOSE, BUN, CREATININE, CALCIUM in the last 72 hours.  Intake/Output Summary (Last 24 hours) at 02/11/2020 0621 Last data filed at 02/11/2020 0409 Gross per 24 hour  Intake 840 ml  Output 75 ml  Net 765 ml     Physical Exam: Vital Signs Blood pressure 140/61, pulse 67, temperature 98.6 F (37 C), resp. rate 16, height 5\' 7"  (1.702 m), weight 59 kg, SpO2 98 %. General: No acute distress. Sitting up in bedside chair; comfortable except for R hand- rubbing R hand and R leg constantly, NAD  Mood and affect are appropriate Heart: Regular rate and rhythm no rubs murmurs or extra sounds Lungs: Clear to auscultation, breathing unlabored, no rales or wheezes Abdomen: Positive bowel sounds, soft nontender to palpation, nondistended Extremities: No clubbing, cyanosis, or edema Skin: No evidence of breakdown, no evidence of rash Neurologic: Cranial nerves II through XII intact, motor strength is 5/5 in LEFT deltoid, bicep, tricep, grip, hip flexor, knee extensors, ankle dorsiflexor and plantar flexor 0/5 RUE, 0/5 LLE in all muscle groups  Tone- MAS 3 in R elbow flexors, mAS 4 in RIght finger flexors; clawing; difficulty to get into R hand splint MAS 3 in R Hamstrings and quads.  Unchanged  Musculoskeletal lacks 30deg from full ext of R knee Assessment/Plan: 1. Functional deficits secondary to RIght hip fracture due to fall in a pt with chronic spastic hemi Stable for D/C today F/u PCP in 3-4 weeks Orthopedics for postop check F/u PM&Rprn will see neuro at [facility] for spasticity  See D/C summary See D/C  instructions Care Tool:  Bathing    Body parts bathed by patient: Right arm, Chest, Abdomen, Front perineal area, Right upper leg, Left upper leg, Right lower leg, Left lower leg, Face   Body parts bathed by helper: Left arm, Buttocks Body parts n/a: Left arm(did not attempt)   Bathing assist Assist Level: Moderate Assistance - Patient 50 - 74%     Upper Body Dressing/Undressing Upper body dressing   What is the patient wearing?: Pull over shirt    Upper body assist Assist Level: Supervision/Verbal cueing    Lower Body Dressing/Undressing Lower body dressing      What is the patient wearing?: Pants, Incontinence brief     Lower body assist Assist for lower body dressing: Moderate Assistance - Patient 50 - 74%     Toileting Toileting    Toileting assist Assist for toileting: Moderate Assistance - Patient 50 - 74%     Transfers Chair/bed transfer  Transfers assist     Chair/bed transfer assist level: Minimal Assistance - Patient > 75%     Locomotion Ambulation   Ambulation assist   Ambulation activity did not occur: Safety/medical concerns  Assist level: Moderate Assistance - Patient 50 - 74% Assistive device: Cane-quad Max distance: 76ft   Walk 10 feet activity   Assist  Walk 10 feet activity did not occur: Safety/medical concerns  Assist level: Moderate Assistance - Patient - 50 - 74% Assistive device: Cane-quad   Walk 50 feet activity  Assist Walk 50 feet with 2 turns activity did not occur: Safety/medical concerns  Assist level: Moderate Assistance - Patient - 50 - 74% Assistive device: Cane-quad    Walk 150 feet activity   Assist Walk 150 feet activity did not occur: Safety/medical concerns         Walk 10 feet on uneven surface  activity   Assist Walk 10 feet on uneven surfaces activity did not occur: Safety/medical concerns         Wheelchair     Assist Will patient use wheelchair at discharge?: Yes Type of  Wheelchair: Manual    Wheelchair assist level: Supervision/Verbal cueing, Set up assist Max wheelchair distance: 178ft    Wheelchair 50 feet with 2 turns activity    Assist        Assist Level: Set up assist, Supervision/Verbal cueing   Wheelchair 150 feet activity     Assist      Assist Level: Supervision/Verbal cueing, Set up assist   Blood pressure 140/61, pulse 67, temperature 98.6 F (37 C), resp. rate 16, height 5\' 7"  (1.702 m), weight 59 kg, SpO2 98 %.    Medical Problem List and Plan: 1.Decreased functional mobilitysecondary to right FNF.S/P righthemiarthroplasty anterior approach 01/21/2020. Weightbearing as tolerated. 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 Was at w/c level PTA  -Continue CIR PT, OT Plan d/c to [facility] 2/19  2. Antithrombotics: -DVT/anticoagulation:Lovenox. Negative vascular study -antiplatelet therapy: resume ASA 325mg  daily 3. Pain Management:limit to tylenol, ice pack for now given AMS.  -2/15: pain well controlled.  4. Mood:Prozac 40 mf daily, "depression may be residual effect of higher dose tizanidine  -antipsychotic agents: N/A 5. Neuropsych: This patientiscapable of making decisions on hisown behalf. 6. Skin/Wound Care:Routine skin checks- sutures removed 2/14.   7. Fluids/Electrolytes/Nutrition:Routine in and outs with follow-up chemistries 8. Acute blood loss anemia. Follow-up CBC Hgb 11.4 improving  9. 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  .Pt received botox injections by Dr. [facility]-18[facility] on 11/30/2019.Still has severe RUE and RLE tone- may need to try Dysport given dosage needs -Patientalsoon baclofen 10 mg in the morning 20 mg nightly. -family to bring in resting WHO and day time splint from ALF -was using w/c predominantly PTA  2mg  QID  tizanidine QHS ,0.25mg  Klonopin qhs, became sedated on tizanidine to 4 mg 3 times daily, he will follow-up with neurology at [facility] for spasticity management  .  10. BPH. Resume Flomax 0.4 mg daily. 11.Hyperlipidemia. Lipitor 12. Constipation.   13. Confusion: Improved 14. Insomnia:  On klonopin for hamstring spasms, this has helped sleep as well, monitor for sedation- per wife was on Ativan at home  LOS: 18 days A FACE TO FACE EVALUATION WAS PERFORMED  14/07/2019 02/11/2020, 6:21 AM

## 2020-02-11 NOTE — Progress Notes (Addendum)
Patient discharge to [facility] in [place]. Report called to Ashley, LPN. Patient discharged via [facility] with patient belongings. All morning meds given and patient has no complaints of pain. All needs met. Patient ready for transport. 

## 2020-02-22 ENCOUNTER — Ambulatory Visit (INDEPENDENT_AMBULATORY_CARE_PROVIDER_SITE_OTHER): Payer: Medicare Other

## 2020-02-22 ENCOUNTER — Other Ambulatory Visit: Payer: Self-pay

## 2020-02-22 ENCOUNTER — Ambulatory Visit (INDEPENDENT_AMBULATORY_CARE_PROVIDER_SITE_OTHER): Payer: Medicare Other | Admitting: Orthopaedic Surgery

## 2020-02-22 ENCOUNTER — Telehealth: Payer: Self-pay | Admitting: Physical Medicine & Rehabilitation

## 2020-02-22 ENCOUNTER — Encounter: Payer: Self-pay | Admitting: Orthopaedic Surgery

## 2020-02-22 VITALS — Ht 67.0 in | Wt 130.0 lb

## 2020-02-22 DIAGNOSIS — S72001A Fracture of unspecified part of neck of right femur, initial encounter for closed fracture: Secondary | ICD-10-CM

## 2020-02-22 NOTE — Progress Notes (Signed)
   Post-Op Visit Note   Patient: Joshua Shannon           Date of Birth: 1941-09-28           MRN: [id number] Visit Date: 02/22/2020 PCP: Kaleen Mask, MD   Assessment & Plan:  Chief Complaint:  Chief Complaint  Patient presents with  . Right Hip - Routine Post Op    Right hip hemi arthroplasty DOS 01/21/2020   Visit Diagnoses:  1. Closed displaced fracture of right femoral neck (HCC)     Plan: Mr. Gallicchio is approximately 4 weeks status post right hip hemiarthroplasty.  He comes in today with his wife.  [facility]  He reports some pain with physical therapy but no pain at rest.  Surgical scar is healed without any signs of infection.  He has painless movement of the hip.  He does have a mild flexion contracture of the knee due to prolonged sitting.  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  From my standpoint he is stable but he does need to resume his Botox injections especially in his hamstrings to prevent flexion contracture.  He will continue to mobilize with PT.  Recheck in 4 weeks with AP pelvis x-ray.  Follow-Up Instructions: Return in about 4 weeks (around 03/21/2020).   Orders:  Orders Placed This Encounter  Procedures  . XR HIP UNILAT W OR W/O PELVIS 2-3 VIEWS RIGHT   No orders of the defined types were placed in this encounter.   Imaging: XR HIP UNILAT W OR W/O PELVIS 2-3 VIEWS RIGHT  Result Date: 02/22/2020 Stable right partial hip replacement.   PMFS History: Patient Active Problem List   Diagnosis Date Noted  . Memory loss   . History of depression   . Right femoral fracture (HCC) 01/24/2020  . Closed displaced fracture of right femoral neck (HCC) 01/20/2020  . Fall at home, initial encounter 01/20/2020   Past Medical History:  Diagnosis Date  . Depression   . Hemorrhoids   . Hyperlipemia     No family history on file.  Past Surgical History:  Procedure Laterality Date  . ANTERIOR  APPROACH HEMI HIP ARTHROPLASTY Right 01/21/2020   Procedure: ANTERIOR APPROACH HEMI HIP ARTHROPLASTY;  Surgeon: Tarry Kos, MD;  Location: [facility] OR;  Service: Orthopedics;  Laterality: Right;   Social History   Occupational History  . Not on file  Tobacco Use  . Smoking status: Never Smoker  . Smokeless tobacco: Never Used  Substance and Sexual Activity  . Alcohol use: Not on file  . Drug use: Never  . Sexual activity: Not on file

## 2020-02-22 NOTE — Telephone Encounter (Signed)
Patients wife called complaining about no one ever called her about botox injections for her husband.  I looked at Joshua Shannon's d/c note and Dr. Wynn Banker last note, and they state that no follow up needed in our clinic and that patient should follow up with Dr. Leanna Battles at [facility].  Wife said that the social worker said that he was to follow up with Dr. Wynn Banker for Botox.  I explained to her that all I can do is send Dr. Wynn Banker a message, but the soonest he could get to read this is next Tuesday, due to him being on vacation.  Please let patients wife know if you can do botox injections, it is hard to get patient to [facility]

## 2020-02-24 NOTE — Telephone Encounter (Signed)
Spoke with Jamesetta So and explained to her that if she wishes to switch her husband from [facility] to [place], give [place] a call and we can schedule a hospital follow up with Dr. Wynn Banker.  She does have an appt with [facility] on 3/9 and will see how that goes and then give [place] a call.

## 2020-02-24 NOTE — Telephone Encounter (Signed)
Pt was established with [facility] Neuro for Botox abnd he had appt with them in [place]Arch , if she would like to switch MDs she can make appt with me

## 2020-03-09 DIAGNOSIS — Z96649 Presence of unspecified artificial hip joint: Secondary | ICD-10-CM

## 2020-03-24 ENCOUNTER — Emergency Department (HOSPITAL_COMMUNITY)
Admission: EM | Admit: 2020-03-24 | Discharge: 2020-03-24 | Disposition: A | Discharge status: Home / Self Care | Payer: Medicare Other | Attending: Emergency Medicine | Admitting: Emergency Medicine

## 2020-03-24 ENCOUNTER — Emergency Department (HOSPITAL_COMMUNITY): Payer: Medicare Other

## 2020-03-24 DIAGNOSIS — R42 Dizziness and giddiness: Secondary | ICD-10-CM | POA: Diagnosis not present

## 2020-03-24 DIAGNOSIS — Z7982 Long term (current) use of aspirin: Secondary | ICD-10-CM | POA: Insufficient documentation

## 2020-03-24 DIAGNOSIS — Z79899 Other long term (current) drug therapy: Secondary | ICD-10-CM | POA: Diagnosis not present

## 2020-03-24 LAB — CBC WITH DIFFERENTIAL/PLATELET
Abs Immature Granulocytes: 0.03 10*3/uL (ref 0.00–0.07)
Basophils Absolute: 0 10*3/uL (ref 0.0–0.1)
Basophils Relative: 1 %
Eosinophils Absolute: 0.1 10*3/uL (ref 0.0–0.5)
Eosinophils Relative: 2 %
HCT: 42.9 % (ref 39.0–52.0)
Hemoglobin: 13.2 g/dL (ref 13.0–17.0)
Immature Granulocytes: 1 %
Lymphocytes Relative: 20 %
Lymphs Abs: 1.1 10*3/uL (ref 0.7–4.0)
MCH: 28 pg (ref 26.0–34.0)
MCHC: 30.8 g/dL (ref 30.0–36.0)
MCV: 90.9 fL (ref 80.0–100.0)
Monocytes Absolute: 0.5 10*3/uL (ref 0.1–1.0)
Monocytes Relative: 8 %
Neutro Abs: 3.8 10*3/uL (ref 1.7–7.7)
Neutrophils Relative %: 68 %
Platelets: 252 10*3/uL (ref 150–400)
RBC: 4.72 MIL/uL (ref 4.22–5.81)
RDW: 13.2 % (ref 11.5–15.5)
WBC: 5.5 10*3/uL (ref 4.0–10.5)
nRBC: 0 % (ref 0.0–0.2)

## 2020-03-24 LAB — BASIC METABOLIC PANEL
Anion gap: 10 (ref 5–15)
BUN: 25 mg/dL — ABNORMAL HIGH (ref 8–23)
CO2: 28 mmol/L (ref 22–32)
Calcium: 9.1 mg/dL (ref 8.9–10.3)
Chloride: 102 mmol/L (ref 98–111)
Creatinine, Ser: 1.09 mg/dL (ref 0.61–1.24)
GFR calc Af Amer: >60 mL/min (ref >60)
GFR calc non Af Amer: >60 mL/min (ref >60)
Glucose, Bld: 115 mg/dL — ABNORMAL HIGH (ref 70–99)
Potassium: 4.2 mmol/L (ref 3.5–5.1)
Sodium: 140 mmol/L (ref 135–145)

## 2020-03-24 LAB — PROTIME-INR
INR: 1 (ref 0.8–1.2)
Prothrombin Time: 12.9 seconds (ref 11.4–15.2)

## 2020-03-24 NOTE — ED Notes (Signed)
[facility] called by Kieren Adkison

## 2020-03-24 NOTE — Discharge Instructions (Signed)
Your MRI today showed no acute stroke, bleeding, or brain tumors. Your labs were reassuring. We offered a urinalysis but you did not want to wait for this at this time given lack of any urinary symptoms. Based on the previous team's history and evaluation gathering, we suspect this is recurrence of your previous vertigo. As your symptoms have improved and your work-up is reassuring, we feel you are safe for discharge and follow-up with your primary neurologist and your PCP. Please rest and stay hydrated. If any symptoms change or worsen, please return to nearest emergency department.

## 2020-03-24 NOTE — ED Provider Notes (Signed)
4:56 PM Care assumed from Dr. Clarice Pole.  At time of transfer care, patient is awaiting results of MRI brain without contrast to look for stroke as the cause of the patient's dizziness symptoms that are transient.  According to previous team Patient history of vertigo as well as stroke.  Patient was already feeling better.  If MRI is reassuring, dissipate discharge home with neurology follow-up.  6:19 PM MRIs returned with no evidence of bleed, stroke, or mass. Old stroke was seen. I reassessed patient he reports he is feeling better. He does not want to wait for urinalysis as he is having no urinary symptoms. He would like to go home with outpatient PCP and neurology follow-up. This was felt reasonable as it fits with the previous plan.  Patient will be discharged back to facility for further outpatient management and understanding of return precautions.   Clinical Impression: 1. Vertigo   2. Dizziness     Disposition: Discharge  Condition: Good  I have discussed the results, Dx and Tx plan with the pt(& family if present). He/she/they expressed understanding and agree(s) with the plan. Discharge instructions discussed at great length. Strict return precautions discussed and pt &/or family have verbalized understanding of the instructions. No further questions at time of discharge.    New Prescriptions   No medications on file    Follow Up: Kaleen Mask, MD [address] [place] [address] [phone]     Your neurologist     [facility] EMERGENCY DEPARTMENT [address] [id number] mc [place] [address] [phone]       Jaquis Picklesimer, Canary Brim, MD 03/24/20 [phone]

## 2020-03-24 NOTE — ED Notes (Signed)
Pt transported to MRI 

## 2020-03-24 NOTE — ED Triage Notes (Signed)
Pt arrives via ems from [facility] with intermittent dizziness X2weeks. Dx with UTI at that time. Pt still continues to have dizziness. Pt sent here for a neuro eval. 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 VSS with EMS.

## 2020-03-24 NOTE — ED Provider Notes (Signed)
[facility] EMERGENCY DEPARTMENT Provider Note   CSN: [id number] Arrival date & time: 03/24/20  1150     History Chief Complaint  Patient presents with  . Dizziness    Joshua Shannon is a 79 y.o. male.  HPI Patient reports he awakened this morning with dizziness.  He reports it felt like things were moving around in his head.  If he closed his eyes it seemed like he was swaying from side to side.  Symptoms were somewhat improved by opening his eyes.  He denies double vision or headache.  He reports at baseline he has gait problems due to prior stroke.  He reports he uses a cane and has previous right-sided weakness.  He has not noted any increased weakness or numbness.  He denies ringing roaring or rushing sounds in his ears.  He denies he is ever formally been diagnosed with vertigo but does recall episodes in the past where if he leaned over a certain way that he would get dizziness.  He has not had fevers or chills.  He denies has been sick.  He denies ever having been given medication for vertigo.    Past Medical History:  Diagnosis Date  . Depression   . Hemorrhoids   . Hyperlipemia     Patient Active Problem List   Diagnosis Date Noted  . History of hip replacement   . Memory loss   . History of depression   . Right femoral fracture ([place]) 01/24/2020  . Closed displaced fracture of right femoral neck ([place]) 01/20/2020  . Fall at home, initial encounter 01/20/2020    Past Surgical History:  Procedure Laterality Date  . ANTERIOR APPROACH HEMI HIP ARTHROPLASTY Right 01/21/2020   Procedure: ANTERIOR APPROACH HEMI HIP ARTHROPLASTY;  Surgeon: Leandrew Koyanagi, MD;  Location: [facility];  Service: Orthopedics;  Laterality: Right;       No family history on file.  Social History   Tobacco Use  . Smoking status: Never Smoker  . Smokeless tobacco: Never Used  Substance Use Topics  . Alcohol use: Not on file  . Drug use: Never    Home Medications Prior to  Admission medications   Medication Sig Start Date End Date Taking? Authorizing Provider  acetaminophen (TYLENOL) 325 MG tablet Take 650 mg by mouth every 6 (six) hours as needed for mild pain or moderate pain.   Yes [provider]  aspirin 325 MG tablet Take 1 tablet (325 mg total) by mouth daily. 02/10/20  Yes Angiulli, Lavon Paganini, PA-C  atorvastatin (LIPITOR) 40 MG tablet Take 40 mg by mouth every evening. 01/07/20  Yes [provider]  baclofen (LIORESAL) 10 MG tablet Take 10-20 mg by mouth daily. Take 1 tablet by mouth in the morning and Take 2 tablets at bedtime 03/14/20  Yes [provider]  buPROPion (WELLBUTRIN) 75 MG tablet Take 75 mg by mouth daily.  03/21/20  Yes [provider]  carboxymethylcellulose (REFRESH TEARS) 0.5 % SOLN Place 2 drops into both eyes in the morning, at noon, and at bedtime.   Yes [provider]  docusate sodium (COLACE) 100 MG capsule Take 1 capsule (100 mg total) by mouth 2 (two) times daily. 01/24/20  Yes Debbe Odea, MD  docusate sodium (COLACE) 100 MG capsule Take 100 mg by mouth 2 (two) times daily.   Yes [provider]  donepezil (ARICEPT) 5 MG tablet Take 5 mg by mouth daily. 03/08/20  Yes [provider]  gabapentin (NEURONTIN) 100 MG capsule Take 200 mg by mouth at bedtime.  03/12/20  Yes [provider]  hydroxypropyl methylcellulose / hypromellose (ISOPTO TEARS / GONIOVISC) 2.5 % ophthalmic solution Place 1 drop into both eyes at bedtime.   Yes [provider]  LORazepam (ATIVAN) 0.5 MG tablet Take 0.5 mg by mouth every 12 (twelve) hours as needed for anxiety.   Yes [provider]  magnesium oxide (MAG-OX) 400 MG tablet Take 400 mg by mouth at bedtime.   Yes [provider]  meclizine (ANTIVERT) 25 MG tablet Take 25 mg by mouth every 6 (six) hours as needed for dizziness.  03/23/20  Yes [provider]  ondansetron (ZOFRAN) 8 MG tablet Take 8 mg by mouth  every 8 (eight) hours as needed for nausea or vomiting.  03/16/20  Yes [provider]  pantoprazole (PROTONIX) 20 MG tablet Take 20 mg by mouth daily. 03/22/20  Yes [provider]  polyethylene glycol (MIRALAX / GLYCOLAX) 17 g packet Take 17 g by mouth daily.   Yes [provider]  sennosides-docusate sodium (SENOKOT-S) 8.6-50 MG tablet Take 1 tablet by mouth as directed. Take on MWF   Yes [provider]  tamsulosin (FLOMAX) 0.4 MG CAPS capsule Take 0.4 mg by mouth at bedtime.    Yes [provider]  traMADol-acetaminophen (ULTRACET) 37.5-325 MG tablet Take 1 tablet by mouth every 4 (four) hours as needed for severe pain. 02/10/20  Yes Angiulli, Mcarthur Rossetti, PA-C  clonazePAM (KLONOPIN) 0.25 MG disintegrating tablet Take 1 tablet (0.25 mg total) by mouth at bedtime. Patient not taking: Reported on 03/24/2020 02/10/20   Angiulli, Mcarthur Rossetti, PA-C  tiZANidine (ZANAFLEX) 2 MG tablet Take 1 tablet (2 mg total) by mouth 3 (three) times daily. Patient not taking: Reported on 03/24/2020 02/10/20   Angiulli, Mcarthur Rossetti, PA-C    Allergies    Patient has no known allergies.  Review of Systems   Review of Systems 10 Systems reviewed and are negative for acute change except as noted in the HPI.  Physical Exam Updated Vital Signs BP (!) 158/70   Pulse (!) 56   Temp 97.6 F (36.4 C) (Oral)   Resp 13   Ht 5\' 7"  (1.702 m)   Wt 54.4 kg   SpO2 99%   BMI 18.79 kg/m   Physical Exam Constitutional:      Comments: Alert, nontoxic.  No respiratory distress.  HENT:     Head: Normocephalic and atraumatic.     Comments: Patient does have some right facial droop.  Airway clear. Eyes:     Extraocular Movements: Extraocular movements intact.     Comments: No significant nystagmus.  Pupils are symmetric 2 to 3 mm and responsive.  Cardiovascular:     Rate and Rhythm: Normal rate and regular rhythm.  Pulmonary:     Effort: Pulmonary effort is normal.  Abdominal:      General: There is no distension.     Palpations: Abdomen is soft.     Tenderness: There is no abdominal tenderness. There is no guarding.  Musculoskeletal:        General: No swelling. Normal range of motion.     Cervical back: Neck supple.     Right lower leg: No edema.     Left lower leg: No edema.  Skin:    General: Skin is warm and dry.  Neurological:     Comments: Mental status is clear.  Patient is giving full history.  Recall intact.  Speech slightly difficult to understand.  Patient has facial droop on the right which is pre-existing.  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  Patient can elevate his left upper extremity and hold without drift.  5 out of 5 grip strength on the left.  Patient can elevate his right lower extremity off the bed but with difficulty to hold.  Without difficulty he can elevate the left lower extremity and hold against resistance.     ED Results / Procedures / Treatments   Labs (all labs ordered are listed, but only abnormal results are displayed) Labs Reviewed  BASIC METABOLIC PANEL - Abnormal; Notable for the following components:      Result Value   Glucose, Bld 115 (*)    BUN 25 (*)    All other components within normal limits  CBC WITH DIFFERENTIAL/PLATELET  PROTIME-INR  URINALYSIS, ROUTINE W REFLEX MICROSCOPIC    EKG EKG Interpretation  Date/Time:  Friday March 24 2020 12:07:42 EDT Ventricular Rate:  62 PR Interval:  204 QRS Duration: 90 QT Interval:  444 QTC Calculation: 450 R Axis:   60 Text Interpretation: Normal sinus rhythm Normal ECG agree. no old Confirmed by Arby Barrette [phone]) on 03/24/2020 12:41:32 PM   Radiology No results found.  Procedures Procedures (including critical care time)  Medications Ordered in ED Medications - No data to display  ED Course  I have reviewed the triage vital signs and the nursing notes.  Pertinent labs & imaging results that were available during my care of the patient were  reviewed by me and considered in my medical decision making (see chart for details).    MDM Rules/Calculators/A&P                     Patient presents with symptoms on above.  He has history of significant stroke with right-sided deficits and gait impairment.  Today patient had onset of vertiginous type symptoms.  We will proceed with MRI to rule out cerebellar stroke.  If negative for stroke, patient appropriate for discharge and outpatient follow-up.  Dr. Rush Landmark to follow-up on MRI results. Final Clinical Impression(s) / ED Diagnoses Final diagnoses:  Vertigo    Rx / DC Orders ED Discharge Orders    None       Arby Barrette, MD 03/24/20 [phone]

## 2020-03-28 ENCOUNTER — Ambulatory Visit: Payer: Self-pay

## 2020-03-28 ENCOUNTER — Other Ambulatory Visit: Payer: Self-pay

## 2020-03-28 ENCOUNTER — Ambulatory Visit (INDEPENDENT_AMBULATORY_CARE_PROVIDER_SITE_OTHER): Payer: Medicare Other | Admitting: Orthopaedic Surgery

## 2020-03-28 ENCOUNTER — Encounter: Payer: Self-pay | Admitting: Orthopaedic Surgery

## 2020-03-28 DIAGNOSIS — S72001A Fracture of unspecified part of neck of right femur, initial encounter for closed fracture: Secondary | ICD-10-CM

## 2020-03-28 NOTE — Progress Notes (Signed)
   Post-Op Visit Note   Patient: Joshua Shannon           Date of Birth: 09/23/1941           MRN: [id number] Visit Date: 03/28/2020 PCP: Kaleen Mask, MD   Assessment & Plan:  Chief Complaint:  Chief Complaint  Patient presents with  . Right Hip - Pain   Visit Diagnoses:  1. Closed displaced fracture of right femoral neck (HCC)     Plan: Patient is a pleasant 79 year old gentleman who presents to our clinic today 9 weeks 4 days out right hemihip arthroplasty 01/21/2020.  He has been doing well.  He is still at [facility] nursing facility where he is getting continued physical therapy.  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  This has significantly helped.  He is able to ambulate with a physical therapist at this point.  Examination of his right lower extremity reveals resolved flexion contracture to the right knee.  He has no pain with logroll.  His calves are soft and nontender.  At this point, I do believe the patient would benefit from continued skilled physical therapy.  He will follow up with [place] in 6 weeks time for repeat evaluation and AP pelvis lateral right hip x-rays.  Call with concerns or questions in the meantime.  Follow-Up Instructions: Return in about 6 weeks (around 05/09/2020).   Orders:  Orders Placed This Encounter  Procedures  . XR Pelvis 1-2 Views   No orders of the defined types were placed in this encounter.   Imaging: XR Pelvis 1-2 Views  Result Date: 03/28/2020 X-rays demonstrate a well-seated partial hip replacement without complication   PMFS History: Patient Active Problem List   Diagnosis Date Noted  . History of hip replacement   . Memory loss   . History of depression   . Right femoral fracture (HCC) 01/24/2020  . Closed displaced fracture of right femoral neck (HCC) 01/20/2020  . Fall at home, initial encounter 01/20/2020   Past Medical History:    Diagnosis Date  . Depression   . Hemorrhoids   . Hyperlipemia     History reviewed. No pertinent family history.  Past Surgical History:  Procedure Laterality Date  . ANTERIOR APPROACH HEMI HIP ARTHROPLASTY Right 01/21/2020   Procedure: ANTERIOR APPROACH HEMI HIP ARTHROPLASTY;  Surgeon: Tarry Kos, MD;  Location: [facility] OR;  Service: Orthopedics;  Laterality: Right;   Social History   Occupational History  . Not on file  Tobacco Use  . Smoking status: Never Smoker  . Smokeless tobacco: Never Used  Substance and Sexual Activity  . Alcohol use: Not on file  . Drug use: Never  . Sexual activity: Not on file

## 2020-05-09 ENCOUNTER — Ambulatory Visit (INDEPENDENT_AMBULATORY_CARE_PROVIDER_SITE_OTHER): Payer: Medicare Other | Admitting: Orthopaedic Surgery

## 2020-05-09 ENCOUNTER — Other Ambulatory Visit: Payer: Self-pay

## 2020-05-09 ENCOUNTER — Encounter: Payer: Self-pay | Admitting: Orthopaedic Surgery

## 2020-05-09 ENCOUNTER — Ambulatory Visit (INDEPENDENT_AMBULATORY_CARE_PROVIDER_SITE_OTHER): Payer: Medicare Other

## 2020-05-09 DIAGNOSIS — S72001A Fracture of unspecified part of neck of right femur, initial encounter for closed fracture: Secondary | ICD-10-CM

## 2020-05-09 NOTE — Progress Notes (Signed)
   Post-Op Visit Note   Patient: Joshua Shannon           Date of Birth: [birth year]           MRN: [id number] Visit Date: 05/09/2020 PCP: Kaleen Mask, MD   Assessment & Plan:  Chief Complaint:  Chief Complaint  Patient presents with  . Right Hip - Follow-up    Right hip hemi arthroplasty 01/21/2020   Visit Diagnoses:  1. Closed displaced fracture of right femoral neck (HCC)     Plan: Joshua Shannon is 4 months status post right hip hemiarthroplasty.  He comes in today with his family member.  Overall he is doing well.  He has stopped PT due to plateauing.  Surgical scar is fully healed.  He exhibits no pain in the right hip.  At this point he has reached MMI.  We will see him back as needed.  Follow-Up Instructions: Return if symptoms worsen or fail to improve.   Orders:  Orders Placed This Encounter  Procedures  . XR HIP UNILAT W OR W/O PELVIS 2-3 VIEWS RIGHT   No orders of the defined types were placed in this encounter.   Imaging: XR HIP UNILAT W OR W/O PELVIS 2-3 VIEWS RIGHT  Result Date: 05/09/2020 Uncomplicated right partial hip replacement   PMFS History: Patient Active Problem List   Diagnosis Date Noted  . History of hip replacement   . Memory loss   . History of depression   . Right femoral fracture (HCC) 01/24/2020  . Closed displaced fracture of right femoral neck (HCC) 01/20/2020  . Fall at home, initial encounter 01/20/2020   Past Medical History:  Diagnosis Date  . Depression   . Hemorrhoids   . Hyperlipemia     History reviewed. No pertinent family history.  Past Surgical History:  Procedure Laterality Date  . ANTERIOR APPROACH HEMI HIP ARTHROPLASTY Right 01/21/2020   Procedure: ANTERIOR APPROACH HEMI HIP ARTHROPLASTY;  Surgeon: Tarry Kos, MD;  Location: [facility];  Service: Orthopedics;  Laterality: Right;   Social History   Occupational History  . Not on file  Tobacco Use  . Smoking status: Never Smoker  . Smokeless tobacco: Never Used    Substance and Sexual Activity  . Alcohol use: Not on file  . Drug use: Never  . Sexual activity: Not on file

## 2020-07-26 ENCOUNTER — Telehealth: Payer: Self-pay | Admitting: Orthopaedic Surgery

## 2020-07-26 NOTE — Telephone Encounter (Signed)
Yes hand center.  For what though?

## 2020-07-26 NOTE — Telephone Encounter (Signed)
Please advise 

## 2020-07-26 NOTE — Telephone Encounter (Signed)
Patient's wife Jamesetta So called asked if Dr Roda Shutters would refer patient to a hand specialist? Jamesetta So said the OT Kirt Boys)  at [facility]  told her to contact Dr Roda Shutters.  The number to contact Jamesetta So is [phone]

## 2020-07-27 ENCOUNTER — Other Ambulatory Visit: Payer: Self-pay

## 2020-07-27 DIAGNOSIS — M79641 Pain in right hand: Secondary | ICD-10-CM

## 2020-07-27 NOTE — Telephone Encounter (Signed)
Order made

## 2020-07-27 NOTE — Telephone Encounter (Signed)
Knuckles are bending too much. Has a splint. OT Molly recommended for patient to go to hand specialist.   Would like to go to [facility].

## 2020-07-27 NOTE — Telephone Encounter (Signed)
Ok that's fine

## 2020-09-18 ENCOUNTER — Ambulatory Visit: Payer: Medicare Other | Attending: Family Medicine | Admitting: Occupational Therapy

## 2020-09-18 ENCOUNTER — Encounter: Payer: Self-pay | Admitting: Occupational Therapy

## 2020-09-18 ENCOUNTER — Other Ambulatory Visit: Payer: Self-pay

## 2020-09-18 DIAGNOSIS — G8111 Spastic hemiplegia affecting right dominant side: Secondary | ICD-10-CM | POA: Diagnosis present

## 2020-09-18 NOTE — Patient Instructions (Signed)
Your Splint This splint should initially be fitted by a healthcare practitioner.  The healthcare practitioner is responsible for providing wearing instructions and precautions to the patient, other healthcare practitioners and care provider involved in the patient's care.  This splint was custom made for you. Please read the following instructions to learn about wearing and caring for your splint.  Precautions Should your splint cause any of the following problems, remove the splint immediately and contact your therapist/physician.  Swelling  Severe Pain  Pressure Areas  Stiffness  Numbness  Do not wear your splint while operating machinery unless it has been fabricated for that purpose.  When To Wear Your Splint Where your splint according to your therapist/physician instructions. Daytime for 1 hours 1-2 x day for the first few days, monitor skin closely, remove splint if pressure areas or redness that does not go away after 15 mins If no prroblems progress to wearing 2-3 hrs at a time during daytime. He should continue to wear previous soft splint at night time.  If any problems, STOP wearing splint and bring to next appt.  Care and Cleaning of Your Splint 1. Keep your splint away from open flames. 2. Your splint will lose its shape in temperatures over 135 degrees Farenheit, ( in car windows, near radiators, ovens or in hot water).  Never make any adjustments to your splint, if the splint needs adjusting remove it and make an appointment to see your therapist. 3. Your splint, including the cushion liner may be cleaned with soap and lukewarm water.  Do not immerse in hot water over 135 degrees Farenheit. 4. Straps may be washed with soap and water, but do not moisten the self-adhesive portion.

## 2020-09-18 NOTE — Therapy (Signed)
[facility] [address] [place], [place], [address] Phone: [phone]   Fax:  [phone]  Occupational Therapy Evaluation  Patient Details  Name: Joshua Shannon MRN: [id number] Date of Birth: 1941-10-24 Referring Provider (OT): Dr. Roda Shutters Lance Bosch (PCP is Dr. Jeannetta Nap)   Encounter Date: 09/18/2020   OT End of Session - 09/18/20 1540    Visit Number 1    Number of Visits 6    Date for OT Re-Evaluation 10/30/20    Authorization Type [employer]    Authorization Time Period 90 days    Authorization - Visit Number 1    Authorization - Number of Visits 10    Progress Note Due on Visit 10    OT Start Time 1033    OT Stop Time 1148    OT Time Calculation (min) 75 min    Activity Tolerance Patient tolerated treatment well    Behavior During Therapy [facility] for tasks assessed/performed           Past Medical History:  Diagnosis Date  . Depression   . Hemorrhoids   . Hyperlipemia     Past Surgical History:  Procedure Laterality Date  . ANTERIOR APPROACH HEMI HIP ARTHROPLASTY Right 01/21/2020   Procedure: ANTERIOR APPROACH HEMI HIP ARTHROPLASTY;  Surgeon: Tarry Kos, MD;  Location: [facility];  Service: Orthopedics;  Laterality: Right;    There were no vitals filed for this visit.      [facility] OT Assessment - 09/18/20 0001      Assessment   Medical Diagnosis CVA    Referring Provider (OT) Dr. Roda Shutters Lance Bosch   PCP is Dr. Jeannetta Nap     Precautions   Precautions Other (comment)    Precaution Comments spasticity      Home  Environment   Family/patient expects to be discharged to: Assisted living    Lives With --   assisted living facility     Prior Function   Level of Independence Needs assistance with ADLs;Needs assistance with gait;Needs assistance with transfers      ADL   ADL comments Pt requires assist with ADLs      Mobility   Mobility Status Needs assist      Tone   Assessment Location Right Upper  Extremity      ROM / Strength   AROM / PROM / Strength PROM      AROM   Overall AROM  Deficits    Overall AROM Comments no active movement present in RUE      PROM   Overall PROM  Deficits    Overall PROM Comments Pt maintains wrist and digits in flexed position at rest, pt demonstrates PIP hyperextension along with MP and DIP flexion. Pt demonstrates moderate spasticity overall and he is at increased reisk for skin break down.      RUE Tone   RUE Tone Moderate;Hypertonic                    OT Treatments/Exercises (OP) - 09/18/20 0001      Splinting   Splinting Pt was seen for fabrication and splinting of a custom resting hand splint with MP's in flexion and IP's in extension. Therapist was unable to  achieve full DIP extension, however pt is scheduled for botox and therefore this may improve. Padding was added to hand portion of splint for improved positioning. Pt/ wife were instructed in splint wear, care and precuations and pt's wife donned several  times. Pt is able to remove splint independently.                 OT Education - 09/18/20 1655    Education Details splint wear, care and precuations, daytime application only initally, pt is to continue soft roll splint at nightime initally.    Person(s) Educated Patient;Spouse    Methods Explanation;Demonstration;Verbal cues;Handout    Comprehension Verbalized understanding;Returned demonstration;Verbal cues required               OT Long Term Goals - 09/18/20 1617      OT LONG TERM GOAL #1   Title Pt/ caregiver will be I with splint wear, care and precautions and gentle stretching prior to splint application to prevent injury.    Time 5    Period Weeks    Status New    Target Date 10/23/20                 Plan - 09/18/20 1641    Clinical Impression Statement Pt is a 79 y.o male with hx of CVA in 2020, pt is currently residing at [facility]. Pt has significant spasticity and RUE hemiplegia. Pt  was referred to outpatient rehab as his facility is unable to fabricate a custom splint to meet pt's complex splinting needs. Pt is scheduled for botox injection tomorrow, per pt/ wife's report.Pt can benefit from skillled occupational therapy to address RUE spasticity, and malpositioning through splinting in order to reduce risk of contracture, skin breakdown and pain.PMH significant for anxiety, depression and hip replacement.    OT Occupational Profile and History Problem Focused Assessment - Including review of records relating to presenting problem    Occupational performance deficits (Please refer to evaluation for details): ADL's;IADL's    Body Structure / Function / Physical Skills ADL;UE functional use;Dexterity;ROM;Flexibility;Pain;Coordination;Strength;IADL    Cognitive Skills Problem Solve;Safety Awareness    Rehab Potential Good    Clinical Decision Making Limited treatment options, no task modification necessary    Comorbidities Affecting Occupational Performance: May have comorbidities impacting occupational performance    Modification or Assistance to Complete Evaluation  No modification of tasks or assist necessary to complete eval    OT Frequency 1x / week    OT Duration --   5 weeks plus eval   OT Treatment/Interventions Passive range of motion;Neuromuscular education;Manual Therapy;Moist Heat;Therapeutic exercise;Splinting;Therapeutic activities;Fluidtherapy;Paraffin;Self-care/ADL training    Plan splint check and modifications, gentle stretching prior to splint application, pt is scheduled for botox, he will return for several visits in case spasticity improves s/p botox    Recommended Other Services FYI Pt's dtr is an [employer] and Agree with Plan of Care Patient;Family member/caregiver           Patient will benefit from skilled therapeutic intervention in order to improve the following deficits and impairments:   Body Structure / Function / Physical  Skills: ADL, UE functional use, Dexterity, ROM, Flexibility, Pain, Coordination, Strength, IADL Cognitive Skills: Problem Solve, Safety Awareness     Visit Diagnosis: Spastic hemiplegia affecting right dominant side, unspecified etiology (HCC) - Plan: Ot plan of care cert/re-cert    Problem List Patient Active Problem List   Diagnosis Date Noted  . History of hip replacement   . Memory loss   . History of depression   . Right femoral fracture (HCC) 01/24/2020  . Closed displaced fracture of right femoral neck (HCC) 01/20/2020  . Fall at home, initial encounter 01/20/2020  Carianna Lague 09/18/2020, 5:01 PM Keene Breath, OTR/L [phone] Phone: [phone] 5:01 PM 09/18/20 [facility] [address] [place], [place], [address] Phone: [phone]   Fax:  [phone]  Name: Joshua Shannon MRN: [id number] Date of Birth: 25-Aug-1941

## 2020-09-27 ENCOUNTER — Ambulatory Visit: Payer: Medicare Other | Attending: Family Medicine | Admitting: Occupational Therapy

## 2020-09-27 ENCOUNTER — Other Ambulatory Visit: Payer: Self-pay

## 2020-09-27 DIAGNOSIS — G8111 Spastic hemiplegia affecting right dominant side: Secondary | ICD-10-CM | POA: Insufficient documentation

## 2020-09-27 NOTE — Therapy (Signed)
[facility] [address] [place], [place], [address] Phone: [phone]   Fax:  [phone]  Occupational Therapy Treatment  Patient Details  Name: Joshua Shannon MRN: [id number] Date of Birth: [birth year] Referring Provider (OT): Dr. Roda Shutters Lance Bosch (PCP is Dr. Jeannetta Nap)   Encounter Date: 09/27/2020   OT End of Session - 09/27/20 1415    Visit Number 2    Number of Visits 6    Date for OT Re-Evaluation 10/30/20    Authorization Type [employer]    Authorization Time Period 90 days    Authorization - Visit Number 2    Authorization - Number of Visits 10    Progress Note Due on Visit 10    OT Start Time 1318    OT Stop Time 1408    OT Time Calculation (min) 50 min    Activity Tolerance Patient tolerated treatment well    Behavior During Therapy [facility] for tasks assessed/performed           Past Medical History:  Diagnosis Date  . Depression   . Hemorrhoids   . Hyperlipemia     Past Surgical History:  Procedure Laterality Date  . ANTERIOR APPROACH HEMI HIP ARTHROPLASTY Right 01/21/2020   Procedure: ANTERIOR APPROACH HEMI HIP ARTHROPLASTY;  Surgeon: Tarry Kos, MD;  Location: [facility];  Service: Orthopedics;  Laterality: Right;    There were no vitals filed for this visit.   Subjective Assessment - 09/27/20 1324    Subjective  Pt reports fingers are coming out of pre fab pm splint    Patient is accompanied by: Family member   WIFE   Pertinent History CVA 05/07/2019. PMH: HLD    Currently in Pain? No/denies          Pt reports coming out of pre fab splint at night where fingers are out and bent - adjusted to small foam/finger roll. Also made adjustments to wrist for greater fit.   Added longer straps to resting hand splint for greater ease doffing, lowered strap on thumb piece, and cut down proximally at forearm and added padding there.   Discussed wear and care of each. Pt instructed that if he can progress  to 5 hours of consecutive wear of resting hand splint during the day, then he could try wearing at night. This may take 1-2 weeks to gradually progress to. Otherwise, pt can continue to wear soft pre-fab at night, and orthoplast resting hand splint during the day.                            OT Long Term Goals - 09/27/20 1415      OT LONG TERM GOAL #1   Title Pt/ caregiver will be I with splint wear, care and precautions and gentle stretching prior to splint application to prevent injury.    Time 5    Period Weeks    Status On-going                 Plan - 09/27/20 1416    Clinical Impression Statement Pt with better fit of pre fab splint once adjustments made. Adjustments made to straps of resting hand splint for greater ease doffing    OT Occupational Profile and History Problem Focused Assessment - Including review of records relating to presenting problem    Occupational performance deficits (Please refer to evaluation for details): ADL's;IADL's    Body Structure /  Function / Physical Skills ADL;UE functional use;Dexterity;ROM;Flexibility;Pain;Coordination;Strength;IADL    Cognitive Skills Problem Solve;Safety Awareness    Rehab Potential Good    Clinical Decision Making Limited treatment options, no task modification necessary    Comorbidities Affecting Occupational Performance: May have comorbidities impacting occupational performance    Modification or Assistance to Complete Evaluation  No modification of tasks or assist necessary to complete eval    OT Frequency 1x / week    OT Duration --   5 weeks plus eval   OT Treatment/Interventions Passive range of motion;Neuromuscular education;Manual Therapy;Moist Heat;Therapeutic exercise;Splinting;Therapeutic activities;Fluidtherapy;Paraffin;Self-care/ADL training    Plan Pt to return on 10/18/20 to make sure both splints are doing well and pt is tolerating    Recommended Other Services FYI Pt's dtr is an  [employer] and Agree with Plan of Care Patient;Family member/caregiver           Patient will benefit from skilled therapeutic intervention in order to improve the following deficits and impairments:   Body Structure / Function / Physical Skills: ADL, UE functional use, Dexterity, ROM, Flexibility, Pain, Coordination, Strength, IADL Cognitive Skills: Problem Solve, Safety Awareness     Visit Diagnosis: Spastic hemiplegia affecting right dominant side, unspecified etiology (HCC)    Problem List Patient Active Problem List   Diagnosis Date Noted  . History of hip replacement   . Memory loss   . History of depression   . Right femoral fracture (HCC) 01/24/2020  . Closed displaced fracture of right femoral neck (HCC) 01/20/2020  . Fall at home, initial encounter 01/20/2020    Kelli Churn, OTR/L 09/27/2020, 2:17 PM  [facility] [address] [place], [place], [address] Phone: [phone]   Fax:  [phone]  Name: Joshua Shannon MRN: [id number] Date of Birth: 08/08/1941

## 2020-10-18 ENCOUNTER — Encounter: Payer: Self-pay | Admitting: Occupational Therapy

## 2020-10-18 ENCOUNTER — Ambulatory Visit: Payer: Medicare Other | Admitting: Occupational Therapy

## 2020-10-18 ENCOUNTER — Other Ambulatory Visit: Payer: Self-pay

## 2020-10-18 DIAGNOSIS — G8111 Spastic hemiplegia affecting right dominant side: Secondary | ICD-10-CM | POA: Diagnosis not present

## 2020-10-18 NOTE — Therapy (Signed)
[facility] [address] [place], [place], [address] Phone: [phone]   Fax:  [phone]  Occupational Therapy Treatment  Patient Details  Name: Joshua Shannon MRN: [id number] Date of Birth: [birth year] Referring Provider (OT): Dr. Erlinda Hong Ferd Hibbs (PCP is Dr. Arelia Sneddon)   Encounter Date: 10/18/2020   OT End of Session - 10/18/20 1605    Visit Number 3    Number of Visits 6    Date for OT Re-Evaluation 10/30/20    Authorization Type Medicare    Authorization Time Period 90 days    Authorization - Visit Number 3    Authorization - Number of Visits 10    Progress Note Due on Visit 10    OT Start Time 1450    OT Stop Time 1515    OT Time Calculation (min) 25 min    Activity Tolerance Patient tolerated treatment well    Behavior During Therapy [facility] for tasks assessed/performed           Past Medical History:  Diagnosis Date  . Depression   . Hemorrhoids   . Hyperlipemia     Past Surgical History:  Procedure Laterality Date  . ANTERIOR APPROACH HEMI HIP ARTHROPLASTY Right 01/21/2020   Procedure: ANTERIOR APPROACH HEMI HIP ARTHROPLASTY;  Surgeon: Leandrew Koyanagi, MD;  Location: [facility];  Service: Orthopedics;  Laterality: Right;    There were no vitals filed for this visit.   Subjective Assessment - 10/18/20 1557    Subjective  Pt / wife report that splint is fitting well overall without pressure areas    Pertinent History CVA 05/07/2019. PMH: HLD    Currently in Pain? No/denies                Treatment: Pt arrived wearing his custom splint. Pt's splint appears to be fitting well, no pressure areas present. Pt reports he had an episode where his fingers curled overnight when he yawned. Pt was instructed to remove splint if this occurs. Pt was instructed that if he has more episodes where his fingers curl at night he should wear splint for several hours during day. Pt was instructed that it is important to  allow some air to get to palm and he should remove splint for several hours per day. Therapist added new padding to palmar portion as it became worn. Therapist provided pt's wife with extra strapping and foam padding. Pt demonstrated his ability to correctly re-apply splint modified independently. Pt and wife are pleased with splint and agree with plans for d/c. Pt did not bring in pre-fab splint today.               OT Education - 10/18/20 1558    Education Details splint wear, care and precautions, pt has been instructed that he can sleep in splint at night if he is not having any issues, pt reports he has slept in splint some at night    Person(s) Educated Patient;Spouse    Methods Explanation;Demonstration;Verbal cues;Handout    Comprehension Verbalized understanding;Returned demonstration;Verbal cues required               OT Long Term Goals - 10/18/20 1603      OT LONG TERM GOAL #1   Title Pt/ caregiver will be I with splint wear, care and precautions and gentle stretching prior to splint application to prevent injury.    Time 5    Period Weeks    Status Partially Met  Pt demonstrates safe application and understanding of splint wear, care and precautions, formal stretching program was not issued.                Plan - 10/18/20 1600    Clinical Impression Statement Pt arrived wearing custom resting hand splint. Pt's splint appears to be fitting well without issues.    OT Occupational Profile and History Problem Focused Assessment - Including review of records relating to presenting problem    Occupational performance deficits (Please refer to evaluation for details): ADL's;IADL's    Body Structure / Function / Physical Skills ADL;UE functional use;Dexterity;ROM;Flexibility;Pain;Coordination;Strength;IADL    Cognitive Skills Problem Solve;Safety Awareness    Rehab Potential Good    Clinical Decision Making Limited treatment options, no task modification  necessary    Comorbidities Affecting Occupational Performance: May have comorbidities impacting occupational performance    Modification or Assistance to Complete Evaluation  No modification of tasks or assist necessary to complete eval    OT Frequency 1x / week    OT Duration --   5 weeks plus eval   OT Treatment/Interventions Passive range of motion;Neuromuscular education;Manual Therapy;Moist Heat;Therapeutic exercise;Splinting;Therapeutic activities;Fluidtherapy;Paraffin;Self-care/ADL training    Plan D/C OT    Recommended Other Services FYI Pt's dtr is an [place] and Agree with Plan of Care Patient;Family member/caregiver           Patient will benefit from skilled therapeutic intervention in order to improve the following deficits and impairments:   Body Structure / Function / Physical Skills: ADL, UE functional use, Dexterity, ROM, Flexibility, Pain, Coordination, Strength, IADL Cognitive Skills: Problem Solve, Safety Awareness     Visit Diagnosis: Spastic hemiplegia affecting right dominant side, unspecified etiology ([place])   OCCUPATIONAL THERAPY DISCHARGE SUMMARY    Current functional level related to goals / functional outcomes: Pt/ wife demonstrate understanding of splint wear, care and precautions.   Remaining deficits: RUE hemiplegia, spasticity   Education / Equipment:Pt / wife were educated in splint wear, care and precautions. Pt demonstrates ability to apply splint correctly. Pt/ wife demonstrate understanding of education. Plan: Patient agrees to discharge.  Patient goals were partially met. Patient is being discharged due to meeting the stated rehab goals.  ?????      Problem List Patient Active Problem List   Diagnosis Date Noted  . History of hip replacement   . Memory loss   . History of depression   . Right femoral fracture ([place]) 01/24/2020  . Closed displaced fracture of right femoral neck ([place]) 01/20/2020  . Fall at home,  initial encounter 01/20/2020    Joshua Shannon 10/18/2020, 4:06 PM Theone Murdoch, OTR/L [phone] Phone: [phone] 4:12 PM 10/18/20 [place], [place], [address] Phone: [phone]   Fax:  [phone]  Name: Joshua Shannon MRN: [id number] Date of Birth: 02-13-1941

## 2020-12-30 IMAGING — MR MR HEAD W/O CM
9 of 10 series · 39 of 48 positions shown · non-contrast
Comparison: None.

CLINICAL DATA: Dizziness

EXAM:
MRI HEAD WITHOUT CONTRAST
TECHNIQUE: Multiplanar, multiecho pulse sequences of the brain and surrounding
structures were obtained without intravenous contrast.

[Series 3: DWI · axial · 3.0mm · 1.09mm/px · z∈[-86,+70]mm · 11 of 106 slices shown (1 of 4)]
[im 1/106]
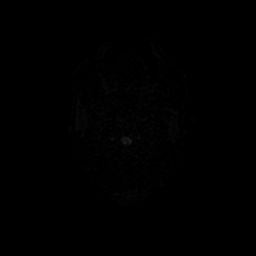
[im 11/106]
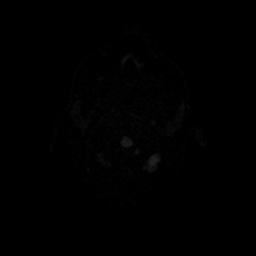
[im 22/106]
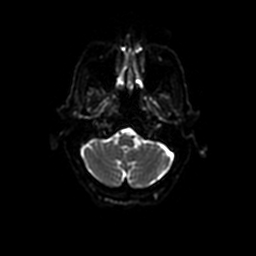
[im 32/106]
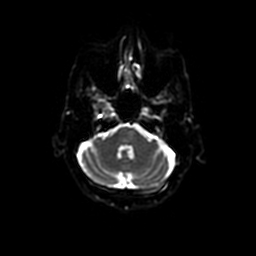
[im 43/106]
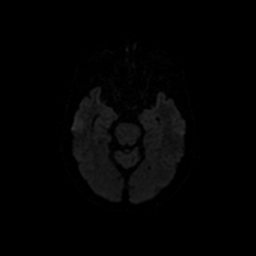
[im 53/106]
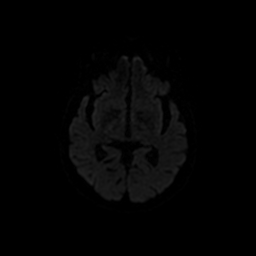
[im 64/106]
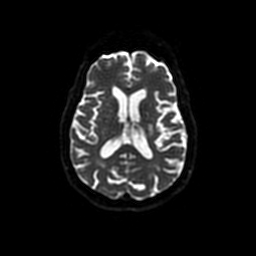
[im 74/106]
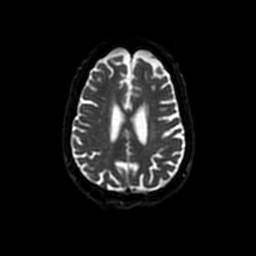
[im 85/106]
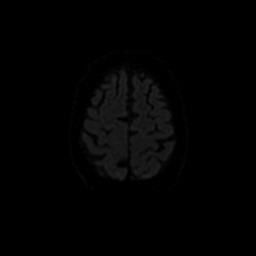
[im 95/106]
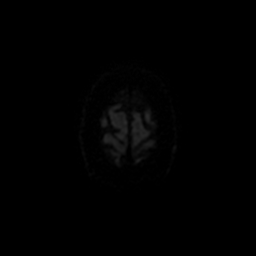
[im 106/106]
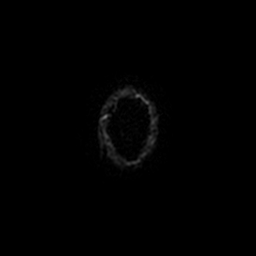

[Series 4: DWI · coronal · 5.0mm · 1.09mm/px · 8 of 76 slices shown (2 of 4)]
[im 1/76]
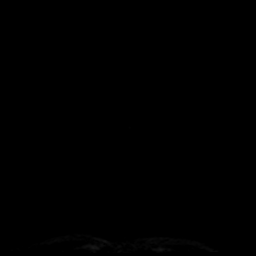
[im 11/76]
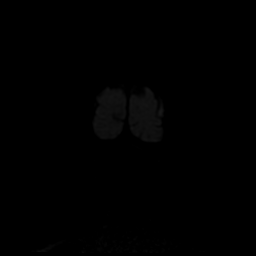
[im 22/76]
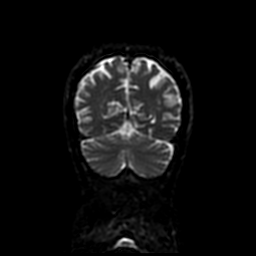
[im 33/76]
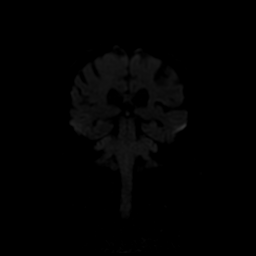
[im 43/76]
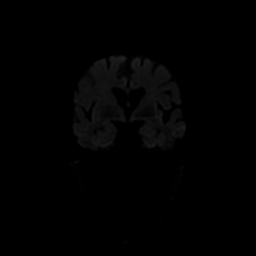
[im 54/76]
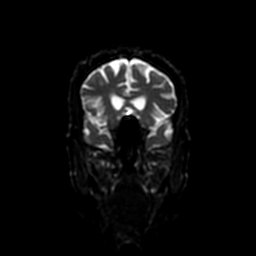
[im 65/76]
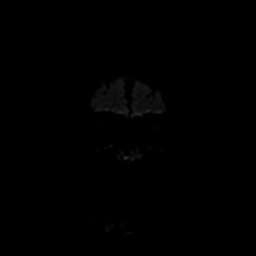
[im 76/76]
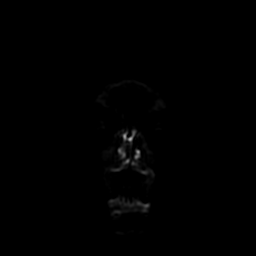

[Series 5: T1 · sagittal · 5.0mm · 0.47mm/px · 2 of 23 slices shown (1 of 2)]
[im 1/23]
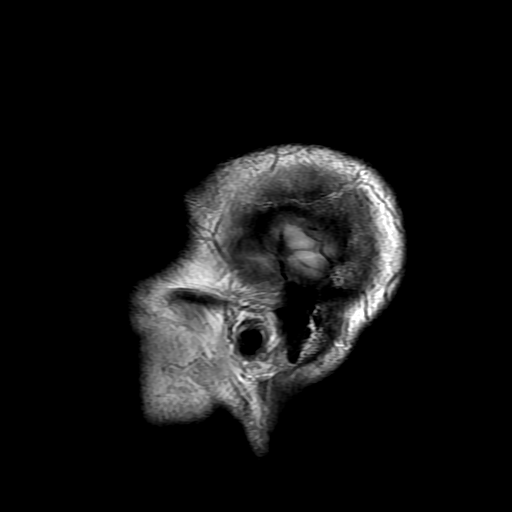
[im 23/23]
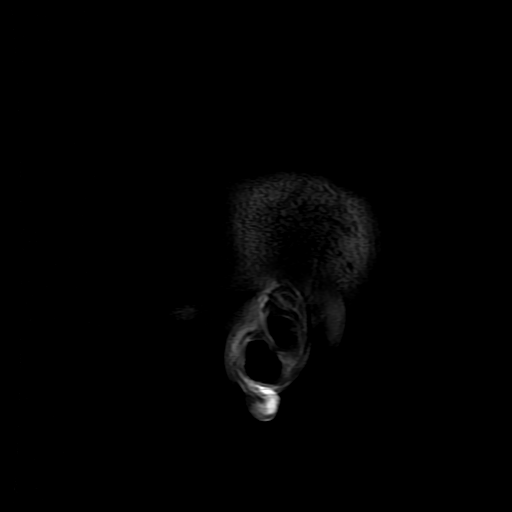

[Series 6: T2 · axial · 5.0mm · 0.43mm/px · z∈[-80,+63]mm · 2 of 25 slices shown (1 of 2)]
[im 1/25]
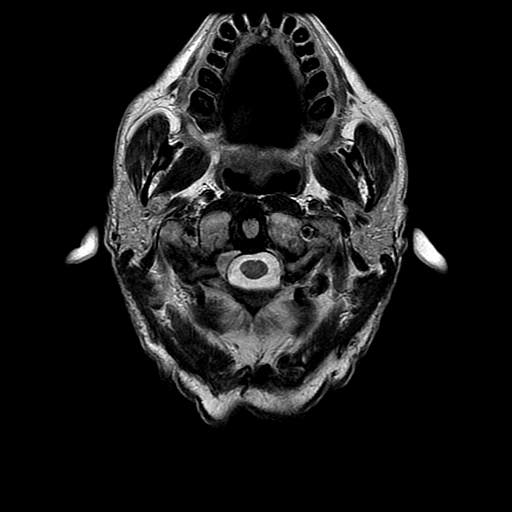
[im 25/25]
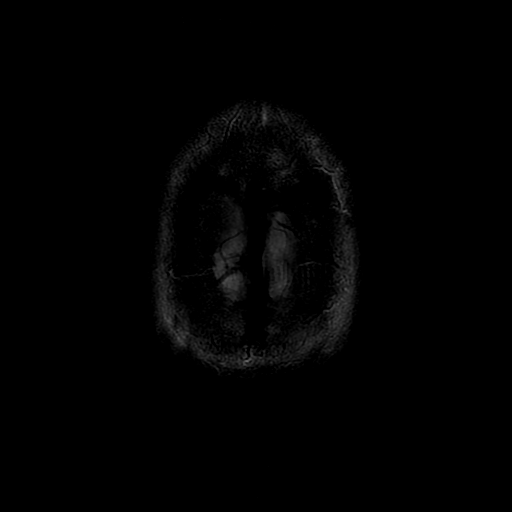

[Series 7: FLAIR · axial · 3.0mm · 0.43mm/px · z∈[-80,+63]mm · 2 of 25 slices shown]
[im 1/25]
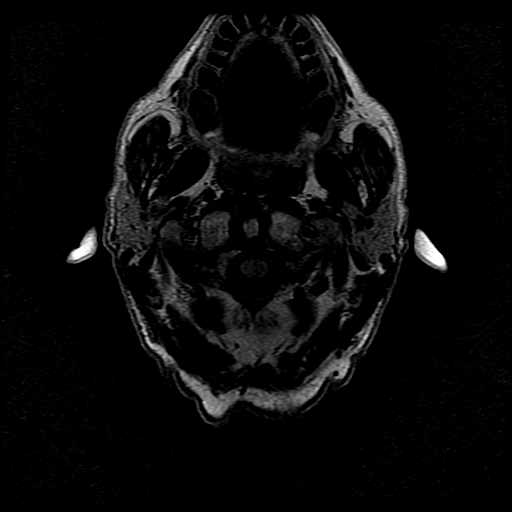
[im 25/25]
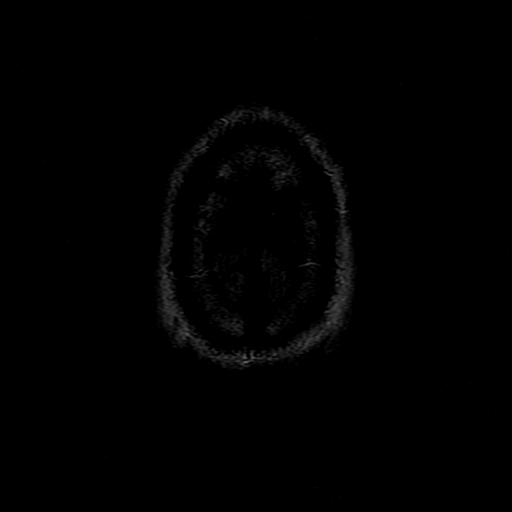

[Series 9: T1 · axial · 3.0mm · 0.43mm/px · z∈[-75,-24]mm · 3 of 104 slices shown (2 of 2)]
[im 1/104]
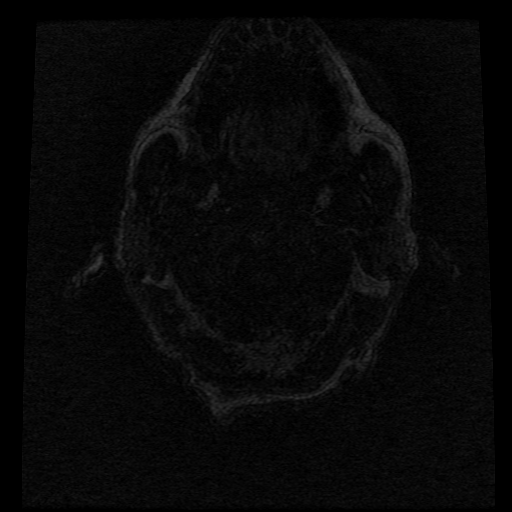
[im 12/104]
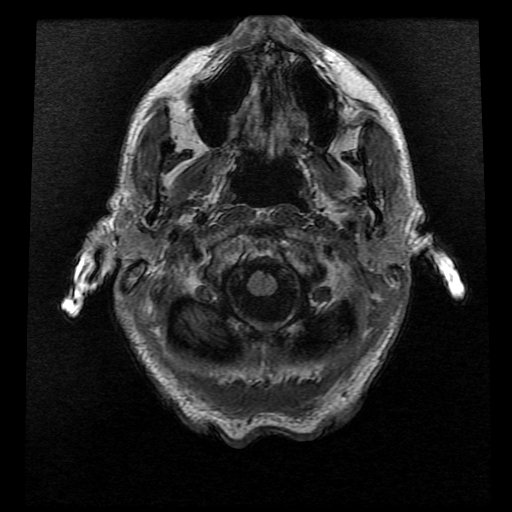
[im 35/104]
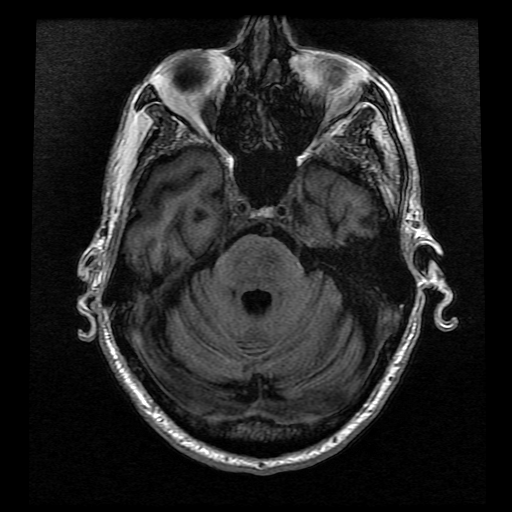

[Series 10: T2 · coronal · 5.0mm · 0.39mm/px · 2 of 25 slices shown (2 of 2)]
[im 1/25]
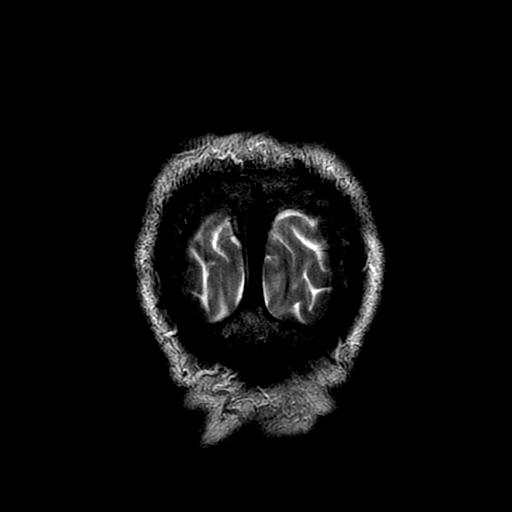
[im 25/25]
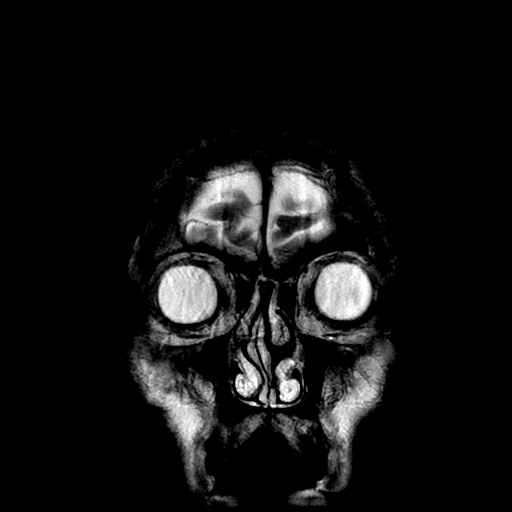

[Series 300: DWI · axial · 3.0mm · 1.09mm/px · z∈[-86,+70]mm · 5 of 53 slices shown (3 of 4)]
[im 1/53]
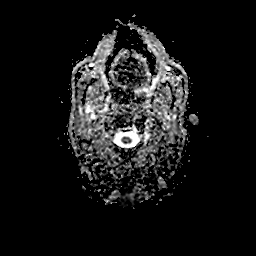
[im 14/53]
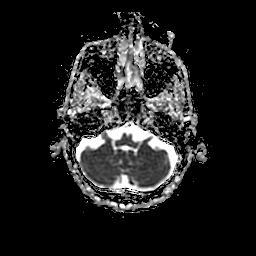
[im 27/53]
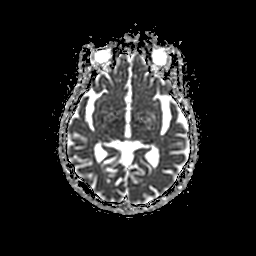
[im 40/53]
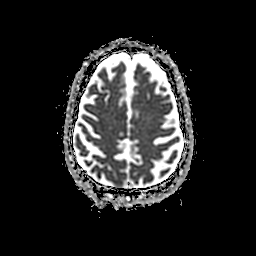
[im 53/53]
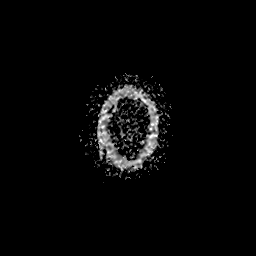

[Series 400: DWI · coronal · 5.0mm · 1.09mm/px · 4 of 38 slices shown (4 of 4)]
[im 1/38]
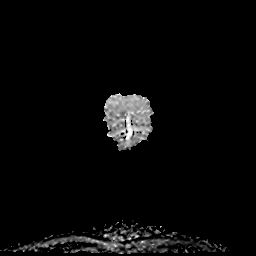
[im 13/38]
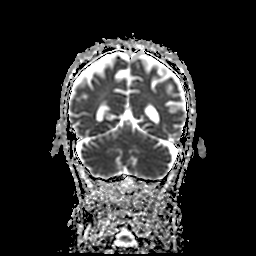
[im 25/38]
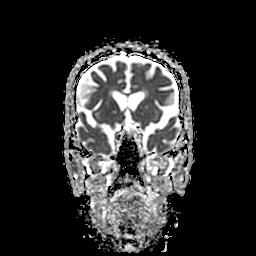
[im 38/38]
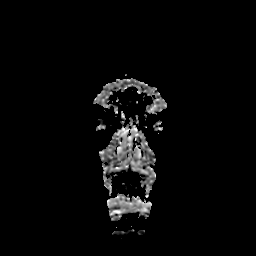

[39 of 48 positions shown; findings below may reference images not displayed]

FINDINGS: Brain: There is no acute infarction or intracranial hemorrhage.
There is no intracranial mass, mass effect, or edema.

Patchy T2 hyperintensity in the supratentorial and pontine white
matter is nonspecific but may reflect mild chronic microvascular
ischemic changes. There is a chronic small vessel infarct of the
left corona radiata and with extension into adjacent basal ganglia
and cerebral peduncle. Prominence of the ventricles and sulci
reflects mild generalized parenchymal volume loss.

Vascular: Major vessel flow voids at the skull base are preserved.

Skull and upper cervical spine: Normal marrow signal is preserved.

Sinuses/Orbits: Trace mucosal thickening.  Orbits are unremarkable.

Other: Sella is unremarkable.  Mastoid air cells are clear.
IMPRESSION: No acute infarction, hemorrhage, or mass.

Mild chronic microvascular ischemic changes. Chronic infarct of the
left corona radiata.

## 2022-11-22 DEATH — deceased
